# Patient Record
Sex: Female | Born: 1970 | Race: Black or African American | Hispanic: Yes | Marital: Married
Health system: Midwestern US, Community
[De-identification: ages and names within clinical notes are randomized; demographics above are authoritative.]

## PROBLEM LIST (undated history)

## (undated) DIAGNOSIS — R011 Cardiac murmur, unspecified: Secondary | ICD-10-CM

## (undated) DIAGNOSIS — F32A Depression, unspecified: Secondary | ICD-10-CM

## (undated) DIAGNOSIS — E119 Type 2 diabetes mellitus without complications: Secondary | ICD-10-CM

## (undated) DIAGNOSIS — T7840XA Allergy, unspecified, initial encounter: Secondary | ICD-10-CM

## (undated) DIAGNOSIS — B009 Herpesviral infection, unspecified: Secondary | ICD-10-CM

## (undated) DIAGNOSIS — K219 Gastro-esophageal reflux disease without esophagitis: Secondary | ICD-10-CM

## (undated) DIAGNOSIS — I10 Essential (primary) hypertension: Secondary | ICD-10-CM

## (undated) DIAGNOSIS — J45909 Unspecified asthma, uncomplicated: Secondary | ICD-10-CM

## (undated) DIAGNOSIS — D573 Sickle-cell trait: Secondary | ICD-10-CM

## (undated) DIAGNOSIS — G4730 Sleep apnea, unspecified: Secondary | ICD-10-CM

## (undated) DIAGNOSIS — T464X5A Adverse effect of angiotensin-converting-enzyme inhibitors, initial encounter: Secondary | ICD-10-CM

## (undated) DIAGNOSIS — J4540 Moderate persistent asthma, uncomplicated: Secondary | ICD-10-CM

## (undated) DIAGNOSIS — R058 Other specified cough: Secondary | ICD-10-CM

## (undated) DIAGNOSIS — Z1231 Encounter for screening mammogram for malignant neoplasm of breast: Secondary | ICD-10-CM

## (undated) DIAGNOSIS — IMO0001 Reserved for inherently not codable concepts without codable children: Secondary | ICD-10-CM

## (undated) DIAGNOSIS — R931 Abnormal findings on diagnostic imaging of heart and coronary circulation: Secondary | ICD-10-CM

## (undated) DIAGNOSIS — R519 Headache, unspecified: Secondary | ICD-10-CM

## (undated) DIAGNOSIS — E559 Vitamin D deficiency, unspecified: Secondary | ICD-10-CM

## (undated) DIAGNOSIS — N63 Unspecified lump in unspecified breast: Secondary | ICD-10-CM

## (undated) DIAGNOSIS — R768 Other specified abnormal immunological findings in serum: Secondary | ICD-10-CM

## (undated) DIAGNOSIS — M7710 Lateral epicondylitis, unspecified elbow: Secondary | ICD-10-CM

## (undated) HISTORY — DX: Sleep apnea, unspecified: G47.30

## (undated) HISTORY — PX: TONSILLECTOMY: SUR1361

## (undated) HISTORY — PX: BREAST SURGERY: SHX581

## (undated) HISTORY — PX: LIPOMA EXCISION: SHX5283

## (undated) HISTORY — DX: Depression, unspecified: F32.A

## (undated) HISTORY — DX: Herpesviral infection, unspecified: B00.9

## (undated) HISTORY — PX: COLONOSCOPY: SHX174

## (undated) HISTORY — DX: Allergy, unspecified, initial encounter: T78.40XA

## (undated) HISTORY — PX: TENDON RELEASE: SHX230

## (undated) HISTORY — DX: Gastro-esophageal reflux disease without esophagitis: K21.9

## (undated) HISTORY — DX: Type 2 diabetes mellitus without complications: E11.9

## (undated) HISTORY — DX: Sickle-cell trait: D57.3

## (undated) HISTORY — PX: WISDOM TOOTH EXTRACTION: SHX21

## (undated) HISTORY — DX: Cardiac murmur, unspecified: R01.1

---

## 2012-05-30 NOTE — Progress Notes (Signed)
HISTORY OF PRESENT ILLNESS  Jacqueline [name] is a 41 y.o. female.  Arm Pain   The history is provided by the patient. This is a new problem. The current episode started more than 1 week ago. The problem occurs constantly. The problem has been gradually worsening. The pain is present in the right arm, right hand, right elbow and right wrist. The quality of the pain is described as aching, pounding and constant. The pain is at a severity of 7/10. The pain is moderate. Associated symptoms include limited range of motion and tingling. Pertinent negatives include no itching, no back pain and no neck pain. The symptoms are aggravated by movement. She has tried OTC pain medications and OTC ointments for the symptoms. The treatment provided no relief.       Review of Systems   Constitutional: Negative.  Negative for fever, chills, weight loss, malaise/fatigue and diaphoresis.   HENT: Negative for ear pain, congestion, sore throat and neck pain.    Eyes: Negative for blurred vision and pain.   Respiratory: Negative for cough, sputum production, shortness of breath and wheezing.    Cardiovascular: Negative for chest pain, palpitations, orthopnea and leg swelling.   Gastrointestinal: Negative for nausea, vomiting, abdominal pain, diarrhea and constipation.   Genitourinary: Negative for dysuria, urgency, frequency and hematuria.   Musculoskeletal: Positive for myalgias and joint pain. Negative for back pain and falls.   Skin: Negative for itching and rash.   Neurological: Positive for tingling. Negative for dizziness, focal weakness, seizures, loss of consciousness, weakness and headaches.   Endo/Heme/Allergies: Negative for environmental allergies. Does not bruise/bleed easily.   Psychiatric/Behavioral: Negative for depression, suicidal ideas, hallucinations and memory loss. The patient is not nervous/anxious.        Physical Exam   Constitutional: She is oriented to person, place, and time. She appears well-developed and  well-nourished. No distress.   HENT:   Head: Normocephalic and atraumatic.   Right Ear: External ear normal.   Left Ear: External ear normal.   Nose: Nose normal.   Mouth/Throat: Oropharynx is clear and moist. No oropharyngeal exudate.   Eyes: Conjunctivae and EOM are normal. Pupils are equal, round, and reactive to light. Right eye exhibits no discharge. Left eye exhibits no discharge. No scleral icterus.   Neck: Normal range of motion. No tracheal deviation present.   Cardiovascular: Normal rate, regular rhythm and normal heart sounds.    Pulmonary/Chest: Effort normal and breath sounds normal. No stridor. No respiratory distress. She has no wheezes.   Abdominal: Soft. Bowel sounds are normal. She exhibits no distension. There is no tenderness. There is no guarding.   Musculoskeletal: She exhibits no edema.        Right elbow: She exhibits decreased range of motion. Tenderness found.   Lymphadenopathy:     She has no cervical adenopathy.   Neurological: She is alert and oriented to person, place, and time. No cranial nerve deficit. Coordination normal.   Skin: Skin is warm and dry. No rash noted. She is not diaphoretic. No erythema.   Psychiatric: She has a normal mood and affect. Her behavior is normal. Thought content normal.       ASSESSMENT and PLAN  Encounter Diagnoses   Name Primary?   ??? Right elbow pain Yes   ??? Tendonitis      Orders Placed This Encounter   ??? SLINGS   ??? ACE WRAP   ??? XR ELBOW RT MIN 3 V   ??? triamterene-hydrochlorothiazide (MAXZIDE) 75-50  mg per tablet   ??? tiZANidine (ZANAFLEX) 2 mg capsule   ??? potassium chloride SR (KLOR-CON 10) 10 mEq tablet   ??? albuterol (PROAIR HFA) 90 mcg/actuation inhaler   ??? predniSONE (DELTASONE) 20 mg tablet

## 2012-05-30 NOTE — Patient Instructions (Addendum)
Tendon Injury (Tendinopathy): After Your Visit  Your Care Instructions  Tendons are tough, flexible tissues that connect muscle to bone. A tendon can hurt or get torn from overuse or aging, especially tendons in the shoulder, elbow, wrist, hip, knee, or ankle. Tendon injuries may be called tendinopathy or tendinitis. Tendon injuries can occur from any motion you have to repeat in a job, sports, or daily activities. Tennis elbow is one common tendon injury.  You can treat most tendon problems with over-the-counter pain medicine, rest, changes in your activities, and physical therapy.  Follow-up care is a key part of your treatment and safety. Be sure to make and go to all appointments, and call your doctor if you are having problems. It???s also a good idea to know your test results and keep a list of the medicines you take.  How can you care for yourself at home?  ?? Rest the sore area. You may have to stop doing the activity that caused the tendon pain for a while.  ?? Take an over-the-counter pain medicine, such as acetaminophen (Tylenol), ibuprofen (Advil, Motrin), or naproxen (Aleve). Read and follow all instructions on the label.  ?? Do not take two or more pain medicines at the same time unless the doctor told you to. Many pain medicines have acetaminophen, which is Tylenol. Too much acetaminophen (Tylenol) can be harmful.  ?? Put ice or a cold pack on the sore area for 10 to 20 minutes at a time. Try to do this every 1 to 2 hours for the next 3 days (when you are awake) or until any swelling goes down. Put a thin cloth between the ice and your skin.  ?? Prop up the sore area on a pillow when you ice it or anytime you sit or lie down during the next 3 days. Try to keep it above the level of your heart. This will help reduce swelling.  ?? Follow your doctor's advice for wearing and caring for a sling, splint, or cast. In some cases, you may wear one of these for a while to help your tendon heal.  ?? Follow your  doctor's advice for stretching and physical therapy. Gently move your joint through its full range of motion. This will prevent stiffness in your joint.  ?? Go back to your activity slowly. Warm up before and stretch after the activity. You also can try making some changes. For example, if a sport caused your tendon pain, alternate the sport with another activity. If using a tool causes pain, switch hands or change your grip. Stop the activity if it hurts. After the activity, apply ice to prevent pain and swelling.  ?? Do not smoke. Smoking can slow healing. If you need help quitting, talk to your doctor about stop-smoking programs and medicines. These can increase your chances of quitting for good.  When should you call for help?  Watch closely for changes in your health, and be sure to contact your doctor if:  ?? Your pain gets worse.  ?? You do not get better as expected.    Where can you learn more?    Go to [facility]   Enter A157 in the search box to learn more about "Tendon Injury (Tendinopathy): After Your Visit."    ?? 2006-2013 [employer]. Care instructions adapted under license by [employer] (which disclaims liability or warranty for this information). This care instruction is for use with your licensed healthcare professional. If you have questions about a  medical condition or this instruction, always ask your healthcare professional. [employer] disclaims any warranty or liability for your use of this information.  Content Version: 9.8.193578; Last Revised: April 02, 2010                Elbow: Exercises  Your Care Instructions  Here are some examples of exercises for elbows. Start each exercise slowly. Ease off the exercise if you start to have pain.  Your doctor or physical therapist will tell you when you can start these exercises and which ones will work best for you.  How to do the exercises  Wrist flexor stretch    1. Extend your arm in front of  you with your palm up.  2. Bend your wrist, pointing your hand toward the floor.  3. With your other hand, gently bend your wrist farther until you feel a mild to moderate stretch in your forearm.  4. Hold for at least 15 to 30 seconds. Repeat 2 to 4 times.  Wrist extensor stretch    Repeat steps 1 to 4 of the stretch above but begin with your extended hand palm down.  Ball or sock squeeze    1. Hold a tennis ball (or a rolled-up sock) in your hand.  2. Make a fist around the ball (or sock) and squeeze.  3. Hold for about 6 seconds, and then relax for up to 10 seconds.  4. Repeat 8 to 12 times.  5. Switch the ball (or sock) to your other hand and do 8 to 12 times.  Wrist deviation    1. Sit so that your arm is supported but your hand hangs off the edge of a flat surface, such as a table.  2. Hold your hand out like you are shaking hands with someone.  3. Move your hand up and down.  4. Repeat this motion 8 to 12 times.  5. Switch arms.  6. Try to do this exercise twice with each hand.  Wrist curls    1. Place your forearm on a table with your hand hanging over the edge of the table, palm up.  2. Place a 1- to 2-pound weight in your hand. This may be a dumbbell, a can of food, or a filled water bottle.  3. Slowly raise and lower the weight while keeping your forearm on the table and your palm facing up.  4. Repeat this motion 8 to 12 times.  5. Switch arms, and do steps 1 through 4.  6. Repeat with your hand facing down toward the floor. Switch arms.  Biceps curls    1. Sit leaning forward with your legs slightly spread and your left hand on your left thigh.  2. Place your right elbow on your right thigh, and hold the weight with your forearm horizontal.  3. Slowly curl the weight up and toward your chest.  4. Repeat this motion 8 to 12 times.  5. Switch arms, and do steps 1 through 4.  Follow-up care is a key part of your treatment and safety. Be sure to make and go to all appointments, and call your doctor if you  are having problems. It's also a good idea to know your test results and keep a list of the medicines you take.    Where can you learn more?    Go to [employer]   Enter M279 in the search box to learn more about "Elbow: Exercises."    ?? 2006-2013 [employer].  Care instructions adapted under license by [employer] (which disclaims liability or warranty for this information). This care instruction is for use with your licensed healthcare professional. If you have questions about a medical condition or this instruction, always ask your healthcare professional. [employer] disclaims any warranty or liability for your use of this information.  Content Version: 9.8.193578; Last Revised: April 25, 2010      Medications Ordered Today   Medications   ??? predniSONE (DELTASONE) 20 mg tablet     Sig: 2 by mouth daily x 2 days then 1 by mouth daily x 2 days then 1/2 by mouth daily x 2 daysmedication     Dispense:  7 Tab     Refill:  0

## 2012-06-30 NOTE — Patient Instructions (Signed)
The patient is to follow up with primary care doctor in 1-2 days,If signs and symptoms become worse the pt is to go to the ER. The patient is to take medications as prescribed. .   Medications Ordered Today   Medications   ??? predniSONE (DELTASONE) 10 mg tablet     Sig: 5 tabs day 1 then 4 tabs day 2 then 3 tabs day 3 then 2 tabs day 4 then 1 tab day 5     Dispense:  15 Tab     Refill:  0   ??? acetaminophen-codeine (TYLENOL-CODEINE #3) 300-30 mg per tablet     Sig: Take 1 Tab by mouth every six (6) hours as needed for Pain for 3 days.     Dispense:  12 Tab     Refill:  0

## 2012-06-30 NOTE — Progress Notes (Signed)
HISTORY OF PRESENT ILLNESS  Jacqueline [name] is a 42 y.o. female.  Arm Pain   The history is provided by the patient. The current episode started more than 1 week ago. The problem occurs constantly. The problem has been gradually worsening. The pain is present in the right fingers, right hand and right arm. The quality of the pain is described as aching, sharp and constant. The pain is at a severity of 7/10. The pain is moderate. Associated symptoms include numbness and tingling. Pertinent negatives include no itching, no back pain and no neck pain. The symptoms are aggravated by lying down. She has tried cold for the symptoms.       Review of Systems   Constitutional: Negative for fever, chills, weight loss and diaphoresis.   HENT: Negative for hearing loss, congestion, sore throat, neck pain and tinnitus.    Eyes: Negative for blurred vision, pain, discharge and redness.   Respiratory: Negative for cough, hemoptysis, sputum production, shortness of breath and wheezing.    Cardiovascular: Negative for chest pain and palpitations.   Gastrointestinal: Negative.  Negative for heartburn, nausea, vomiting, abdominal pain, diarrhea and constipation.   Musculoskeletal: Negative.  Negative for back pain and joint pain.   Skin: Negative for itching and rash.   Neurological: Positive for tingling and numbness. Negative for weakness and headaches.       Physical Exam   Nursing note and vitals reviewed.  Constitutional: She is oriented to person, place, and time. She appears well-developed and well-nourished. She is active.   HENT:   Head: Normocephalic and atraumatic.   Right Ear: External ear normal.   Left Ear: External ear normal.   Nose: Nose normal.   Mouth/Throat: Oropharynx is clear and moist.   Eyes: Conjunctivae and EOM are normal. Pupils are equal, round, and reactive to light.   Neck: Normal range of motion. Neck supple. No tracheal deviation present. No thyromegaly present.   Cardiovascular: Normal rate, regular  rhythm, normal heart sounds and normal pulses.    No murmur heard.  Pulmonary/Chest: Effort normal and breath sounds normal.   Abdominal: Soft. Normal appearance and bowel sounds are normal. She exhibits no distension and no mass. There is no tenderness.   Musculoskeletal:        Arms:  Lymphadenopathy:     She has no cervical adenopathy.   Neurological: She is alert and oriented to person, place, and time. She has normal strength.   Skin: Skin is warm and dry. No rash noted.   Psychiatric: She has a normal mood and affect. Her behavior is normal. Judgment and thought content normal.       ASSESSMENT and PLAN  1. De Quervain's disease (tenosynovitis)  REFERRAL TO ORTHOPEDICS   2. Ulnar neuritis  REFERRAL TO ORTHOPEDICS     Medications Ordered Today   Medications   ??? predniSONE (DELTASONE) 10 mg tablet     Sig: 5 tabs day 1 then 4 tabs day 2 then 3 tabs day 3 then 2 tabs day 4 then 1 tab day 5     Dispense:  15 Tab     Refill:  0   ??? acetaminophen-codeine (TYLENOL-CODEINE #3) 300-30 mg per tablet     Sig: Take 1 Tab by mouth every six (6) hours as needed for Pain for 3 days.     Dispense:  12 Tab     Refill:  0     The patients condition was discussed with the patient and they  understand.  The patient is to follow up with primary care doctor ,If signs and symptoms become worse the pt is to go to the ER. The patient is to take medications as prescribed.

## 2012-07-01 NOTE — Addendum Note (Signed)
Addended by: Jone Baseman D on: 07/01/2012 04:23 PM     Modules accepted: Orders

## 2012-09-06 NOTE — Progress Notes (Signed)
HISTORY OF PRESENT ILLNESS  Jacqueline [name] is a 42 y.o. female.    has a past medical history of Hypertension; Asthma; Sickle cell trait; and Infertility, female.    Chief Complaint   Patient presents with   ??? Establish Care     had cholesterol checked at booth at health fair and triglycerides were over 500   ??? Medication Refill   ??? Dry Skin     eczema          HPI  Pt is a 42 yo WF with the above listed PMH who presents today to establish care after moving from [facility]. Marland Kitchen She would also like to discuss elevated Triglyceride reading at a health fair [place], CP, HTN, GERD symptoms, eczema and Asthma.    Chest Pain  Patient complains of chest pain. Onset was 6 months ago, with unchanged course since that time. Presented to [facility] ER with elevated BP and headache.  The patient admits to chest discomfort that is intermittent, lasting only 20-30 seconds, with radiation to epigastrium, rated as mild in intensity that is dull, substernal in nature.  Associated symptoms are palpitations, fatigue, lower extremity edema. Aggravating factors are none.  Alleviating factors are: nothing. Patient's cardiac risk factors are family history, dyslipidemia, obesity, stress.  Patient's risk factors for DVT/PE: none. Previous cardiac testing includes: Today in office. EKG: normal EKG, normal sinus rhythm, there are no previous tracings available for comparison.     family history-- father MI prior to age 25.     Hypertension  Patient is here to establish for longstanding hypertension.  Dx several years ago.  She indicates that she is feeling well and is not having any symptoms referable to her hypertension (see ROS). Seen 1 month ago at [facility] ED with severe headache, bp was elevated.  CT head or MRI was done and was negative.  Agrees to  Sign record release.   She is not exercising and is adherent to low salt diet.  Blood pressure is not being checked at home, but no headaches since. She admits to some swelling in lower  extremities that comes and goes. She denies trying compression stockings to prevent or treat the swelling. She admits that propping them up in the evenings tends to help. And swelling gone in am.       Patient presents for evaluation of longstanding reflux symptoms. She does not have history of diagnosed esophageal reflux. Complains of the following symptoms at present: chest pain, heartburn, midepigastric pain, regurgitation of undigested food, shortness of breath, symptoms occur at night and upper abdominal discomfort. Symptoms have been present for several months.  She denies dysphagia.  She  has not lost weight.  She denies melena, hematochezia, hematemesis, and coffee ground emesis.   Medical therapy in the past has included tums but she states they do not relieve all of her symptoms.    Patient presents for evaluation of dyspnea.  The patient has been previously diagnosed with asthma. Symptoms currently occur weekly.  Observed precipitants include sneezing, mold, pollens and dust. Patient states she has always had severe allergies. She tried allergy shots but did not complete therapy. Current limitations in activity from asthma: none.   Does she do nebulizer treatments? no  Does she use a rescue inhaler? yes  If yes, how often? 2 times per week.      Patient presents for evaluation of lipids. She was told at a health fair in the [facility] that she  had hypertriglyceridemia and needed to see a PCP soon.  She was not fasting but TG were >500. A repeat fasting lipid profile was done. The patient exercises rarely and does not follow how calorie, low fat, high protein, high fiber diet.  She is not on any medication for this and never has been. She assume it was checked before but does not know.  The patient is not known to have coexisting vascular disease.     Patient would also like to discuss her eczema. She states she has had problems with eczema for several years. Would like refills on her prn steroid cream  today.,    Preventative Health  Patient's health maintenance is up to date except for flu shot.  Pt declines at this time.         Review of Systems   Constitutional: Positive for malaise/fatigue. Negative for fever, chills, weight loss and diaphoresis.   HENT: Negative for congestion and sore throat.    Eyes: Negative for blurred vision and photophobia.   Respiratory: Negative for cough, shortness of breath and wheezing.    Cardiovascular: Positive for chest pain, palpitations and leg swelling.   Gastrointestinal: Positive for heartburn. Negative for nausea, vomiting, abdominal pain, diarrhea, constipation, blood in stool and melena.   Genitourinary: Negative for dysuria.        Long standing infertility. Patient and her husband have been trying to conceive for 12 years with no success. She has been to several specialists but would like to ssee one in the area.  Has been worked up extensively only thing it revealed was her SS trait.    Musculoskeletal: Negative for myalgias.   Skin: Positive for rash.        Dry skin brittle hair      Neurological: Negative for dizziness, loss of consciousness, weakness and headaches.   Endo/Heme/Allergies: Positive for environmental allergies.        Patient carries the sickle cell trait.    Psychiatric/Behavioral: Negative.      Past Medical History   Diagnosis Date   ??? Hypertension    ??? Asthma    ??? Sickle cell trait    ??? Infertility, female      Past Surgical History   Procedure Laterality Date   ??? Pr breast surgery procedure unlisted       breast reduction   ??? Hx heent       tonsillectomy     Family History   Problem Relation Age of Onset   ??? Stroke Mother    ??? Cancer Father    ??? Heart Disease Father    ??? Diabetes Father      History     Social History   ??? Marital Status: MARRIED     Spouse Name: N/A     Number of Children: N/A   ??? Years of Education: N/A     Occupational History   ??? Not on file.     Social History Main Topics   ??? Smoking status: Never Smoker    ??? Smokeless  tobacco: Never Used   ??? Alcohol Use: No   ??? Drug Use: No   ??? Sexually Active: Not on file     Other Topics Concern   ??? Not on file     Social History Narrative   ??? No narrative on file     Allergies   Allergen Reactions   ??? Pcn (Penicillins) Hives   ??? Sulfa (Sulfonamide Antibiotics) Hives   ???  Ultram (Tramadol) Other (comments)     fainted     Current Outpatient Prescriptions on File Prior to Visit   Medication Sig Dispense Refill   ??? potassium chloride SR (KLOR-CON 10) 10 mEq tablet Take 20 mEq by mouth two (2) times a day.       ??? albuterol (PROAIR HFA) 90 mcg/actuation inhaler Take  by inhalation.         No current facility-administered medications on file prior to visit.       Filed Vitals:    09/05/12 1529   BP: 130/90   Pulse: 98   Temp: 98.6 ??F (37 ??C)   TempSrc: Temporal   Resp: 18   Height: 5\' 2"  (1.575 m)   Weight: 183 lb (83.008 kg)   SpO2: 98%   LMP: 08/11/2012   .  Body mass index is 33.46 kg/(m^2).          Physical Exam   Nursing note and vitals reviewed.  Constitutional: She is oriented to person, place, and time. She appears well-developed and well-nourished. No distress.   HENT:   Head: Normocephalic and atraumatic.   Right Ear: External ear normal.   Left Ear: External ear normal.   Nose: Nose normal.   Mouth/Throat: Oropharynx is clear and moist. No oropharyngeal exudate.   Eyes: Conjunctivae are normal. Pupils are equal, round, and reactive to light. No scleral icterus.   Neck: Normal range of motion. Neck supple. No JVD present. No tracheal deviation present.   Cardiovascular: Normal rate, regular rhythm, normal heart sounds and intact distal pulses.  Exam reveals no gallop and no friction rub.    No murmur heard.  Pulmonary/Chest: Effort normal and breath sounds normal. No respiratory distress. She has no wheezes.   Abdominal: Soft. Bowel sounds are normal. She exhibits no distension and no mass. There is no tenderness.   Musculoskeletal: Normal range of motion. She exhibits no edema.    Lymphadenopathy:     She has no cervical adenopathy.   Neurological: She is alert and oriented to person, place, and time.   Skin: Skin is warm and dry. Rash noted.   Eczema noted on thorac      Psychiatric: She has a normal mood and affect. Her behavior is normal.       ASSESSMENT and PLAN    ICD-9-CM    1. Chest pain 786.50 CBC WITH AUTOMATED DIFF     METABOLIC PANEL, COMPREHENSIVE     TSH, 3RD GENERATION     AMB POC EKG ROUTINE W/ 12 LEADS, INTER & REP     XR CHEST PA LAT   2. Heart palpitations 785.1 CBC WITH AUTOMATED DIFF     METABOLIC PANEL, COMPREHENSIVE     TSH, 3RD GENERATION     AMB POC EKG ROUTINE W/ 12 LEADS, INTER & REP   3. Peripheral edema 782.3 CBC WITH AUTOMATED DIFF     METABOLIC PANEL, COMPREHENSIVE     TSH, 3RD GENERATION   4. Muscle spasms of lower extremity 728.85 tiZANidine (ZANAFLEX) 2 mg capsule     CBC WITH AUTOMATED DIFF     METABOLIC PANEL, COMPREHENSIVE     TSH, 3RD GENERATION   5. Sickle cell trait 282.5 CBC WITH AUTOMATED DIFF     METABOLIC PANEL, COMPREHENSIVE     TSH, 3RD GENERATION   6. HTN (hypertension) 401.9 triamterene-hydrochlorothiazide (MAXZIDE) 75-50 mg per tablet     CBC WITH AUTOMATED DIFF     METABOLIC PANEL, COMPREHENSIVE  TSH, 3RD GENERATION   7. Asthma 493.90 CBC WITH AUTOMATED DIFF     METABOLIC PANEL, COMPREHENSIVE     TSH, 3RD GENERATION   8. GERD (gastroesophageal reflux disease) 530.81 CBC WITH AUTOMATED DIFF     METABOLIC PANEL, COMPREHENSIVE     TSH, 3RD GENERATION     omeprazole (PRILOSEC) 20 mg capsule   9. Infertility, female 628.9 CBC WITH AUTOMATED DIFF     METABOLIC PANEL, COMPREHENSIVE     TSH, 3RD GENERATION     REFERRAL TO INFERTILITY   10. Screening cholesterol level V77.91 LIPID PANEL     TSH, 3RD GENERATION   11. Eczema 692.9 desoximetasone (TOPICORT) 0.25 % topical cream     Follow-up Disposition:  Return in about 1 month (around 10/07/2012).  lab results and schedule of future lab studies reviewed with patient  reviewed diet, exercise and  weight control  cardiovascular risk and specific lipid/LDL goals reviewed  reviewed medications and side effects in detail  use of aspirin to prevent MI and TIA's discussed  radiology results and schedule of future radiology studies reviewed with patient    GO TO ED OR CALL 911 if pt  experiences ANY OF THE FOLLOWING:  chest pain, SOB., jaw/neck pain, N/V, diaphoresis, or arm pain . Pt verbally voiced understanding     I have discussed the above plan with the patient. Patient has also been given instructions and information on her conditions.  Patient is aware of all possible side effects and agrees with the above mentioned plan.     Discussed pathophysiology of illness, risks versus benefits of treatments,  & possible complications. Questions answered & treatment as per orders. Total time = 60 minutes with over 50% being counseling & co-ordination of care.      Donnella Bi, DO

## 2012-09-07 LAB — CBC WITH AUTOMATED DIFF
ABS. BASOPHILS: 0 10*3/uL (ref 0.0–0.1)
ABS. EOSINOPHILS: 0.1 10*3/uL (ref 0.0–0.5)
ABS. LYMPHOCYTES: 2.1 10*3/uL (ref 0.8–3.5)
ABS. MONOCYTES: 0.6 10*3/uL — ABNORMAL LOW (ref 0.8–3.5)
ABS. NEUTROPHILS: 2.9 10*3/uL (ref 1.5–8.0)
BASOPHILS: 0 % (ref 0–2)
EOSINOPHILS: 2 % (ref 0–5)
HCT: 38.5 % — ABNORMAL LOW (ref 41–53)
HGB: 12.8 g/dL (ref 12.0–16.0)
LYMPHOCYTES: 36 % (ref 19–48)
MCH: 28.1 PG (ref 27–31)
MCHC: 33.2 g/dL (ref 31–37)
MCV: 84.4 FL (ref 80–100)
MONOCYTES: 11 % — ABNORMAL HIGH (ref 3–9)
MPV: 10.2 FL (ref 5.9–10.3)
NEUTROPHILS: 51 % (ref 40–74)
PLATELET: 271 10*3/uL (ref 130–400)
RBC: 4.56 M/uL (ref 4.2–5.4)
RDW: 14.5 % (ref 11.5–14.5)
WBC: 5.7 10*3/uL (ref 4.5–10.8)

## 2012-09-07 LAB — METABOLIC PANEL, COMPREHENSIVE
A-G Ratio: 1.3 (ref 1.2–2.2)
ALT (SGPT): 21 U/L (ref 12–78)
AST (SGOT): 16 U/L (ref 15–37)
Albumin: 4.1 g/dL (ref 3.4–5.0)
Alk. phosphatase: 68 U/L (ref 50–136)
Anion gap: 8 mmol/L (ref 6–15)
BUN/Creatinine ratio: 22 (ref 7–25)
BUN: 13 MG/DL (ref 7–18)
Bilirubin, total: 0.6 MG/DL (ref <1.1)
CO2: 28 mmol/L (ref 21–32)
Calcium: 8.9 MG/DL (ref 8.5–10.1)
Chloride: 102 mmol/L (ref 98–107)
Creatinine: 0.6 MG/DL (ref 0.60–1.30)
GFR est AA: >60 mL/min/{1.73_m2} (ref >60)
GFR est non-AA: >60 mL/min/{1.73_m2} (ref >60)
Globulin: 3.1 g/dL (ref 2.4–3.5)
Glucose: 72 mg/dL (ref 70–110)
Potassium: 4.2 mmol/L (ref 3.5–5.3)
Protein, total: 7.2 g/dL (ref 6.4–8.2)
Sodium: 138 mmol/L (ref 136–145)

## 2012-09-07 LAB — LIPID PANEL
Cholesterol, total: 185 MG/DL (ref <200)
HDL Cholesterol: 56 MG/DL (ref 32–96)
LDL, calculated: 118.76 MG/DL (ref <130)
Triglyceride: 64 MG/DL (ref <150)
VLDL, calculated: 12.8 MG/DL (ref 5–32)

## 2012-09-07 LAB — TSH 3RD GENERATION: TSH: 2.69 u[IU]/mL (ref 0.35–3.74)

## 2012-09-07 NOTE — Progress Notes (Signed)
Quick Note:    Specifically tell pt cholesterol is normal tell her to never have cholestrol check at health fair again (per me)  Overall labs work looks good; there are a few labs that are outside the standard normal range, clinical significance doubtful  Donnella Bi, DO    ______

## 2012-09-07 NOTE — Progress Notes (Signed)
Labs were collected @ 950 am per Dr. Hyacinth Meeker, patient was fasting  1-purple tube  1-green tube  1-yellow tube

## 2012-09-08 NOTE — Progress Notes (Signed)
Quick Note:    Spoke with pt regarding lab resutls.  ______

## 2012-10-03 NOTE — Progress Notes (Signed)
Quick Note:    Let pt know xrays were normal, still recommend physical therapy   Bonita Quin will call with appointment   Donnella Bi, DO    ______

## 2012-10-03 NOTE — Progress Notes (Signed)
HISTORY OF PRESENT ILLNESS  Jacqueline [name] is a 42 y.o. female.    has a past medical history of Hypertension; Asthma; Sickle cell trait; and Infertility, female.    Chief Complaint   Patient presents with   ??? Spasms     still having muscle spasms in back   ??? Allergies     having problems with allergies, use to be on singulair, having some congestion, was sick last week   ??? Ear Pain     right ear hurting more than left, didnt see wax buildup, maybe fluid?          HPI  The patient is a very pleasant 42 year old white female with the above listed past medical history who presents to the office today to follow-up care.  Discussed lab work with patient in detail.  See below.  The patient is here to follow-up for atypical chest pain that she states has completely resolved with resuming her Albuterol inhaler and taking the acid reflux medication.  The patient is now currently denying any heartburn, esophageal burning, substernal burning.  There is no associated bloating, nausea, vomiting, or diarrhea.  The patient states the associated chest tightness has completely resolved with use of inhaler.  The patient is a long-term asthmatic who had not had medication for "quite a while" due to lack of insurance.  The patient states that she is having to use her Albuterol inhaler at least twice a day.  She was previously on Singulair.  She states that it really helped control her asthma especially this time of the year.  She has some clear rhinitis with some plugged sensation in her ears.  The patient also is complaining of some irritation on the external ear as well.  The patient states that she believes there may be some wax build-up in it.  The right side is hurting more than left but both sides just feel irritated.      The patient presents today to discuss back pain for the first time.  The patient has macromastia, history of breast reduction secondary to thoracic back pain and states that recently she has begun having some  decreased range of motion with muscle spasms in her thoracic and lower back.  There is no radiation of pain.  There is no loss of bowel or bladder.  She describes the pain as mild to moderate.  At its worse it is a deep ache and spasm.  The patient states at its best it is just a nagging soreness.  She has tried over-the-counter antiinflammatories with little success.  I do not believe that continuing NSAIDs would be a good choice for the patient at this point considering her recent bout with reflux symptoms.  The patient states that she does get some relief from the Zanaflex that she is on but would like to try some physical therapy as well.  She does not have any recent imaging.  She has never seen any type of physiatrist  before or had any type of procedures.  There is no history of trauma.            Review of Systems   Constitutional: Positive for malaise/fatigue.-- improved  Negative for fever, chills, weight loss and diaphoresis.   HENT: Negative for congestion and sore throat.    Eyes: Negative for blurred vision and photophobia.   Respiratory: Per HPI  Cardiovascular: Positive for chest pain, palpitations and leg swelling.--- Resolved   Gastrointestinal: Positive for heartburn.  Negative for nausea, vomiting, abdominal pain, diarrhea, constipation, blood in stool and melena.   Genitourinary: Negative for dysuria.        Long standing infertility. Patient and her husband have been trying to conceive for 12 years with no success. She has been to several specialists but would like to ssee one in the area.  Has been worked up extensively only thing it revealed was her SS trait. -- referral to infertility specialist last appointment     Musculoskeletal: per HPI   Skin: Positive for rash.        Dry skin brittle hair      Neurological: Negative for dizziness, loss of consciousness, weakness and headaches.   Endo/Heme/Allergies: Positive for environmental allergies.        Patient carries the sickle cell trait.     Psychiatric/Behavioral: Negative.      Past Medical History   Diagnosis Date   ??? Hypertension    ??? Asthma    ??? Sickle cell trait    ??? Infertility, female      Past Surgical History   Procedure Laterality Date   ??? Pr breast surgery procedure unlisted       breast reduction   ??? Hx heent       tonsillectomy     Family History   Problem Relation Age of Onset   ??? Stroke Mother    ??? Cancer Father    ??? Heart Disease Father    ??? Diabetes Father      History     Social History   ??? Marital Status: MARRIED     Spouse Name: N/A     Number of Children: N/A   ??? Years of Education: N/A     Occupational History   ??? Not on file.     Social History Main Topics   ??? Smoking status: Never Smoker    ??? Smokeless tobacco: Never Used   ??? Alcohol Use: No   ??? Drug Use: No   ??? Sexually Active: Not on file     Other Topics Concern   ??? Not on file     Social History Narrative   ??? No narrative on file     Allergies   Allergen Reactions   ??? Pcn (Penicillins) Hives   ??? Sulfa (Sulfonamide Antibiotics) Hives   ??? Ultram (Tramadol) Other (comments)     fainted     Current Outpatient Prescriptions on File Prior to Visit   Medication Sig Dispense Refill   ??? desoximetasone (TOPICORT) 0.25 % topical cream Apply  to affected area two (2) times a day.  100 g  0   ??? triamterene-hydrochlorothiazide (MAXZIDE) 75-50 mg per tablet Take 1 Tab by mouth daily.  30 Tab  2   ??? tiZANidine (ZANAFLEX) 2 mg capsule Take 2 mg by mouth three (3) times daily.  90 Cap  2   ??? omeprazole (PRILOSEC) 20 mg capsule Take 1 Cap by mouth daily.  30 Cap  2   ??? potassium chloride SR (KLOR-CON 10) 10 mEq tablet Take 20 mEq by mouth two (2) times a day.       ??? albuterol (PROAIR HFA) 90 mcg/actuation inhaler Take  by inhalation.         No current facility-administered medications on file prior to visit.       Filed Vitals:    10/03/12 1043   BP: 120/70   Pulse: 81   Temp: 98.9 ??F (37.2 ??C)  TempSrc: Temporal   Resp: 18   Height: 5\' 2"  (1.575 m)   Weight: 176 lb (79.833 kg)   SpO2: 97%    LMP: 08/11/2012   .  Body mass index is 32.18 kg/(m^2).          Physical Exam   Nursing note and vitals reviewed.  Constitutional: She is oriented to person, place, and time. She appears well-developed and well-nourished. No distress.   HENT:   Head: Normocephalic and atraumatic.   Right Ear: External ear normal. Canal erythematous   Left Ear: External ear normal. Canal erythematous   Nose: Nose normal.   Mouth/Throat: Oropharynx is clear and moist. No oropharyngeal exudate.   Eyes: Conjunctivae are normal. Pupils are equal, round, and reactive to light. No scleral icterus.   Neck: Normal range of motion. Neck supple. No JVD present. No tracheal deviation present.   Cardiovascular: Normal rate, regular rhythm, normal heart sounds and intact distal pulses.  Exam reveals no gallop and no friction rub.    No murmur heard.  Pulmonary/Chest: Effort normal and breath sounds normal. No respiratory distress. She has no wheezes.   Abdominal: Soft. Bowel sounds are normal. She exhibits no distension and no mass. There is no tenderness.   Musculoskeletal: Thoracic and lumbar paraspinal tenderness with associated decreased ROM and pain    Lymphadenopathy:     She has no cervical adenopathy.   Neurological: She is alert and oriented to person, place, and time.  LE DTRs 2/4   Skin: Skin is warm and dry. Rash noted.   Eczema noted on thorac      Psychiatric: She has a normal mood and affect. Her behavior is normal.     Lab Results   Component Value Date/Time    WBC 5.7 09/07/2012  9:50 AM    HGB 12.8 09/07/2012  9:50 AM    HCT 38.5 09/07/2012  9:50 AM    PLATELET 271 09/07/2012  9:50 AM    MCV 84.4 09/07/2012  9:50 AM     Lab Results   Component Value Date/Time    Sodium 138 09/07/2012  9:50 AM    Potassium 4.2 09/07/2012  9:50 AM    Chloride 102 09/07/2012  9:50 AM    CO2 28 09/07/2012  9:50 AM    Anion gap 8 09/07/2012  9:50 AM    Glucose 72 09/07/2012  9:50 AM    BUN 13 09/07/2012  9:50 AM    Creatinine 0.60 09/07/2012  9:50 AM     BUN/Creatinine ratio 22 09/07/2012  9:50 AM    GFR est AA >60 09/07/2012  9:50 AM    GFR est non-AA >60 09/07/2012  9:50 AM    Calcium 8.9 09/07/2012  9:50 AM    Bilirubin, total 0.6 09/07/2012  9:50 AM    AST 16 09/07/2012  9:50 AM    Alk. phosphatase 68 09/07/2012  9:50 AM    Protein, total 7.2 09/07/2012  9:50 AM    Albumin 4.1 09/07/2012  9:50 AM    Globulin 3.1 09/07/2012  9:50 AM    A-G Ratio 1.3 09/07/2012  9:50 AM    ALT 21 09/07/2012  9:50 AM     Lab Results   Component Value Date/Time    TSH 2.69 09/07/2012  9:50 AM     Lab Results   Component Value Date/Time    Cholesterol, total 185 09/07/2012  9:50 AM    HDL Cholesterol 56 09/07/2012  9:50 AM  LDL, calculated 118.76 09/07/2012  9:50 AM    VLDL, calculated 12.8 09/07/2012  9:50 AM    Triglyceride 64 09/07/2012  9:50 AM       ASSESSMENT and PLAN    ICD-9-CM    1. Low back pain 724.2 XR SPINE LUMB 2 OR 3 V     REFERRAL TO PHYSICAL THERAPY     XR SPINE THORAC 3 V   2. HTN (hypertension) 401.9 Continue current therapy    3. GERD (gastroesophageal reflux disease) 530.81 Continue current therapy    4. Asthma 493.90 montelukast (SINGULAIR) 10 mg tablet   5. Thoracic back pain 724.1 XR SPINE LUMB 2 OR 3 V     REFERRAL TO PHYSICAL THERAPY     XR SPINE THORAC 3 V   6. Otitis externa 380.10 neomycin-polymyxin-hydrocortisone, buffered, (PEDIOTIC) 3.5-10,000-1 mg-unit/mL-% otic suspension     Follow-up Disposition: prn   lab results and schedule of future lab studies reviewed with patient  reviewed diet, exercise and weight control  cardiovascular risk and specific lipid/LDL goals reviewed  reviewed medications and side effects in detail  use of aspirin to prevent MI and TIA's discussed  radiology results and schedule of future radiology studies reviewed with patient    GO TO ED OR CALL 911 if pt  experiences ANY OF THE FOLLOWING:  chest pain, SOB., jaw/neck pain, N/V, diaphoresis, or arm pain . Pt verbally voiced understanding     I have discussed the above plan with the patient.  Patient has also been given instructions and information on her conditions.  Patient is aware of all possible side effects and agrees with the above mentioned plan.     Discussed pathophysiology of illness, risks versus benefits of treatments,  & possible complications. Questions answered & treatment as per orders. Total time = 30 minutes with over 50% being counseling & co-ordination of care.      Donnella Bi, DO

## 2012-10-03 NOTE — Patient Instructions (Addendum)
Back Stretches: Exercises  Your Care Instructions  Here are some examples of exercises for stretching your back. Start each exercise slowly. Ease off the exercise if you start to have pain.  Your doctor or physical therapist will tell you when you can start these exercises and which ones will work best for you.  How to do the exercises  Overhead stretch    1. Stand comfortably with your feet shoulder-width apart.  2. Looking straight ahead, raise both arms over your head and reach toward the ceiling. Do not allow your head to tilt back.  3. Hold for 15 to 30 seconds, then lower your arms to your sides.  4. Repeat 2 to 4 times.  Side stretch    1. Stand comfortably with your feet shoulder-width apart.  2. Raise one arm over your head, and then lean to the other side.  3. Slide your hand down your leg as you let the weight of your arm gently stretch your side muscles. Hold for 15 to 30 seconds.  4. Repeat 2 to 4 times on each side.  Press-up    1. Lie on your stomach, supporting your body with your forearms.  2. Press your elbows down into the floor to raise your upper back. As you do this, relax your stomach muscles and allow your back to arch without using your back muscles. As your press up, do not let your hips or pelvis come off the floor.  3. Hold for 15 to 30 seconds, then relax.  4. Repeat 2 to 4 times.  Relax and rest    1. Lie on your back with a rolled towel under your neck and a pillow under your knees. Extend your arms comfortably to your sides.  2. Relax and breathe normally.  3. Remain in this position for about 10 minutes.  4. If you can, do this 2 or 3 times each day.  Follow-up care is a key part of your treatment and safety. Be sure to make and go to all appointments, and call your doctor if you are having problems. It's also a good idea to know your test results and keep a list of the medicines you take.   Where can you learn more?   Go to http://www.healthwise.net/[facility]  Enter Y090 in the  search box to learn more about "Back Stretches: Exercises."   ?? 2006-2014 [employer]. Care instructions adapted under license by [employer] (which disclaims liability or warranty for this information). This care instruction is for use with your licensed healthcare professional. If you have questions about a medical condition or this instruction, always ask your healthcare professional. [employer]wise, Inc[employer] disclaims any warranty or liability for your use of this information.  Content Version: 10.0.270728; Last Revised: May 06, 2012

## 2012-10-04 NOTE — Progress Notes (Signed)
Quick Note:    Patient was notified and she would like for you to email her the stretches that you told her about yesterday  ______

## 2012-10-04 NOTE — Progress Notes (Signed)
Quick Note:    Done    ______

## 2012-10-13 NOTE — Progress Notes (Signed)
Physical Therapy Back Evaluation    Patient Information:  Jacqueline [name]   [birth year]  [id number]     Referring Physician: Donnella Bi, DO   Medical diagnosis: mid and low back pain  Date of onset:  chronic  Date of surgery:   N/A for this diagnosis  Orders:  Eval and Treat   Been previously seen in PT this calendar year?:     Past Medical History   Diagnosis Date   ??? Hypertension    ??? Asthma    ??? Sickle cell trait    ??? Infertility, female      Past Surgical History   Procedure Laterality Date   ??? Pr breast surgery procedure unlisted       breast reduction   ??? Hx heent       tonsillectomy     Current Outpatient Prescriptions on File Prior to Encounter   Medication Sig Dispense Refill   ??? montelukast (SINGULAIR) 10 mg tablet Take 1 Tab by mouth daily.  60 Tab  2   ??? neomycin-polymyxin-hydrocortisone, buffered, (PEDIOTIC) 3.5-10,000-1 mg-unit/mL-% otic suspension Administer 3 Drops in left ear four (4) times daily for 10 days.  10 mL  0   ??? desoximetasone (TOPICORT) 0.25 % topical cream Apply  to affected area two (2) times a day.  100 g  0   ??? triamterene-hydrochlorothiazide (MAXZIDE) 75-50 mg per tablet Take 1 Tab by mouth daily.  30 Tab  2   ??? tiZANidine (ZANAFLEX) 2 mg capsule Take 2 mg by mouth three (3) times daily.  90 Cap  2   ??? omeprazole (PRILOSEC) 20 mg capsule Take 1 Cap by mouth daily.  30 Cap  2   ??? potassium chloride SR (KLOR-CON 10) 10 mEq tablet Take 20 mEq by mouth two (2) times a day.       ??? albuterol (PROAIR HFA) 90 mcg/actuation inhaler Take  by inhalation.         No current facility-administered medications on file prior to encounter.     Allergies   Allergen Reactions   ??? Pcn (Penicillins) Hives   ??? Sulfa (Sulfonamide Antibiotics) Hives   ??? Ultram (Tramadol) Other (comments)     fainted     Are you currently taking any prescription medications, over-the-counter medications, or herbal supplements? See above  Have you ever had any allergic or adverse reactions to any medications? See above     SUBJECTIVE:  Jacqueline [name] is a 42 y.o. female presenting to Physical Therapy with a chief complaint of pain in the thoracic and lumbar spine that comes intermittently. States it feels like spasms. She was picking up sticks in yard and it brought it on and other times it starts for no problem. She takes a diuretic and takes potassium. .  The pain has ranged from 10/10 at worst to 0/10 at best since onset.  Has chronic pain in the low back with intermittent severe pain. Rates pain 6/10 right now.    Mechanism of injury: Patient can recall no specific incident or injury   Pain/Symptom Location & description: mid and low back. Denies radicular sx's   Functional Limitations/aggravating activities:  []               Stairs    [x]               Prolonged Sitting  [x]               Prolonged Standing  []   Walking  []               Driving  [x]               Household Chores(sweeping)  [x]               Bending over to pick up object  [x]               Lifting   []               Balance  []               Other (comment):  Alleviating: rest, muscle relaxer prn, ibuprofen  Sleep quality:good  Exercise:  No specific, formal exercise program at this time for this problem.   PLOF:  Patient previously able to perform all functional activities without  pain.   Patient Goals:  Resolution of pain, return to PLOF   Imaging:  X-rays negative    OBJECTIVE:    Fall Risk Assessment: Timed Up-and-Go 9 seconds  Seconds Rating Follow Up Requirement   <10 Freely Mobile No follow up required   11-20 Mostly Independent No follow up required   20-29  Variable Mobility Follow up testing at physical therapist's discretion    >30 Impaired Mobility Further balance/mobility testing required (specific test is at PT's discretion)       Observation/Posture: Sitting: good       Gait: normal  Pelvic Alignment/Landmarks: L iliac crest and PSIS are superior  Leg Length Assessment: mechanical  Sensation: Sensation to light touch grossly intact and  equal at bilateral LE dermatomes   Balance: Not formally assessed at today's Eval:   Palpation: Patient reports tenderness to palpation at the PSIS bilateral     AROM Trunk        Flexion   3/3      Extension  3/3      Right Sidebend  3/3      Left sidebending  3/3        Strength  Right  Left      Hip Flexion  5/5  5/5      Knee Flexion  5/5  5/5      Knee ext  5/5  5/5      Ankle DF  5/5  5/5      Hip Ext  4/5  4/5      Gluteus Maximus  4/5  4/5   Upper Abdominals: 4/5  Lower Abdominals: 4/5     Special Tests  Right  Left      Fabers  neg  neg      SLR  neg  neg      Sit test  pos  pos     Joint Play Assessment/Mobility: good    Today's Treatment:  Reviewed and discussed status, ther ex, POC/intervention options at length with patient. Performed an Upslip maneuver and the Erhardt. Both released and corrected her alignment. Instructed her in Cat/camel exercise to be done 2x/day.      ASSESSMENT:  Angely Dietz is a 42 y.o. female who presents with symptoms consistent with a pelvic obliquety. No neuro signs noted so hopefully it is a mechanical dysfunction. She reported her pain dropped from 6/10 to 3/10 post treatment. All trunk motions were less painful and just pulling foot up to tie shoe was less painful. . Pt exhibits decreased/impaired ROM/ flexibility/ strength/ posture and increased pain leading to altered function(See above-listed aggrav/functional limitations)  and should benefit from  further treatment. Will begin lumbar stabilization exercises next visit.    Complexities/Co-morbidities/Other Relevent Patient Conditions or Factors that may adversely affect the prognosis/rate of progress for this patient:       GOALS:   Short-Term Goals as needed to achieve Long-Term Functional Goals:  Independent with HEP with supervised modifications   Decrease pain  Improve Flexibility      Report increased postural awareness    Long-Term Functional Goals (to be achieved by discharge):     Patient to tolerate prolonged  standing greater than or = to 45 min to allow for patient to prepare meals & clean up after without  pain     Patient to tolerate ambulating greater than or equal to 45 min to allow for patient to perform grocery shopping without  pain     Patient to tolerate prolonged sitting greater than or = to 60 min to allow for eating meals with family without  pain     Patient to tolerate bending to tie shoes/donn/doff lower body clothing without  pain      Patient to tolerate lifting full laundry basket without  pain      Plan of Care:    Frequency: 2 x/week  Duration: 3-4 weeks     For a maximum total number of  8 visits over a Certification Period of: 10/13/2012 through 11/18/12  Treatment to include (as indicated):  [x]                 Joint Mobilizations []                 Neuromuscular Re-education  []                 Transfer Training [x]                 Soft Tissue Mobilizations  []                 Gait Training []                 Modalities prn  []                 Therapeutic Exercises []                 Balance  []                 Therapeutic Activities [x]                 Patient and Family Training/Education  []                 Other (comment):    Rehab prognosis: Good  with active involvement in treatment.    Patient educated regarding findings/POC, and is in agreement with POC and above-listed goals. Patient agrees to be an active participant in her rehabilitation.    Date POC Established: 10/13/2012    Thank you for this referral.    Signed,    Minna Merritts, PT    PT eval: 60 minutes (untimed code)  Therapeutic Exercise: 0 minutes  Total billable minutes at today's Initial Evaluation Appointment: 60 min      PHYSICIAN APPROVAL:  I certify that the services identified in the evaluation are necessary.        Physician Signature:_________________________________  Date:_____________

## 2012-10-19 NOTE — Progress Notes (Addendum)
Physical Therapy Daily Treatment Note    Patient Information  Jacqueline [name]   April 17, 1971  [id number]     Referring Physician: Donnella Bi, DO   Medical Diagnosis: low back pain    DATE :10/19/2012    Subjective:   Patient reports she has felt better since she was here last but continues to have 4/10 pain in the back.    Summary List:  Patient denies any new medical diagnoses or changes in medical conditions since last visit.  Patient denies any operations or procedures since last visit.  Patient denies any new adverse or allergic drug reactions since last visit.  Patient denies any changes to medication list (herbals, prescription, & OTC).     Objective:   Pelvic obliquety not as large today.   Long Sit positive but it is slight today.  Performed Upslip maneuver and Erhardt followed by core strengthening exercises.  Please refer to today's Treatment Flowsheet & or below listed activities for exercises / interventions performed today. These addressed any or all of the following: education / ROM / strength / flexibility / tissue healing / tissue mobility / joint mobility / edema control / pain control / postural awareness ( which are necessary to achieve pt's LTFG's).        Patient educated on continuing compliance with HEP.       Assessment:   Jacqueline [name] is a 42 y.o. female who presents with symptoms consistent with a pelvic obliquety with low back pain. She tolerated exercises well. Pain was 2/10 post treatment.  Needs verbal and physical prompts to perform exercises correctly.    Plan:   Plan to continue to see 2 x /week  for education, therapeutic exercise, manual therapy prn progressing as patient tolerates focusing on increasing strength, reducing deficits in order to improve overall function.        Minna Merritts, PT    Therapeutic Exercise (Timed Code): 30 minutes  Therapeutic Activity (Timed Code): 0 minutes  Manual Therapy (Timed Code): 0 minutes  Neuromuscular Re-Ed (Timed Code): 0 minutes  Total  Billable Time: 30 minutes

## 2012-10-21 NOTE — Progress Notes (Signed)
Physical Therapy Daily Treatment Note    Patient Information  Jacqueline [name]   09-02-1970  [id number]     Referring Physician: Donnella Bi, DO   Medical Diagnosis:lbp    DATE :10/21/2012    Subjective:   Patient reports she feels pretty good.  She has a place on right side of lbp that is tender and more painful with certain movements.    Summary List:  Patient denies any new medical diagnoses or changes in medical conditions since last visit.  Patient denies any operations or procedures since last visit.  Patient denies any new adverse or allergic drug reactions since last visit.  Patient denies any changes to medication list (herbals, prescription, & OTC).     Objective:   Please refer to today's Treatment Flowsheet & or below listed activities for exercises / interventions performed today. These addressed any or all of the following: education / ROM / strength / flexibility / tissue healing / tissue mobility / joint mobility / edema control / pain control / postural awareness ( which are necessary to achieve pt's LTFG's).  Cont with therapeutic ex as per flow sheet working on passive le stretching, trunk stabilization and endurance to improve her adls and decrease her c/o pain.    Manual Therapy Interventions:6 minutes of grade 3 sacral mobs to improve mobility and decrease c/o pain.  Neuromuscular Re-ed Interventions:0  Active Stretches: 0  Passive Stretches: 0      Patient educated on continuing compliance with HEP.       Assessment:   Jacqueline [name] is a 42 y.o. female who presents with symptoms consistent with lbp. She exhibits decreased trunk stabilization and has tight le ms.  Pt tender over right si.  Tolerated today's session well.      Plan:   Plan to continue to see 2 x /week for 1-2 week/s for education, therapeutic exercise, manual therapy prn progressing as patient tolerates focusing on increasing strength, reducing deficits in order to improve overall function.      Harlene Ramus, PTA     Therapeutic Exercise (Timed Code): 30 minutes  Therapeutic Activity (Timed Code): 0 minutes  Manual Therapy (Timed Code): 6 minutes  Neuromuscular Re-Ed (Timed Code): 0 minutes  Total Billable Time: 36 minutes

## 2012-10-24 ENCOUNTER — Telehealth

## 2012-10-24 NOTE — Telephone Encounter (Signed)
Patient is requesting refill on Klor-Con 10 meq two tabs daily. This medication was listed as historical medication only.

## 2012-10-24 NOTE — Progress Notes (Signed)
Physical Therapy Daily Treatment Note    Patient Information  Jacqueline [name]   Sep 22, 1970  [id number]     Referring Physician: Donnella Bi, DO   Medical Diagnosis: low back pain    DATE :10/24/2012  Visit 3 of 8 specified in original POC at time of Initial Evaluation.    Subjective:   Patient reports her back has felt good all weekend. Still has a tender spot in the low back.    Summary List:  Patient denies any new medical diagnoses or changes in medical conditions since last visit.  Patient denies any operations or procedures since last visit.  Patient denies any new adverse or allergic drug reactions since last visit.  Patient denies any changes to medication list (herbals, prescription, & OTC).     Objective:   Palpation:   Positive pelvic obliquety with slight leg length discrepancy.   Treatment:  Upslip maneuver and Erhardt maneuver  7 min. Both SI joints released. Sacral mobs.  Continued to work on core stabilization using pelvic neutral principle.  Please refer to today's Treatment Flowsheet & or below listed activities for exercises / interventions performed today. These addressed any or all of the following: education / ROM / strength / flexibility / tissue healing / tissue mobility / joint mobility / edema control / pain control / postural awareness ( which are necessary to achieve pt's LTFG's).      Patient educated on continuing compliance with HEP.       Assessment:   Jacqueline [name] is a 42 y.o. female who presents with symptoms consistent with pelvic obliquety.      Plan:   Plan to continue to see 2 x /week for 1-2 week/s for education, therapeutic exercise, manual therapy prn progressing as patient tolerates focusing on increasing strength, reducing deficits in order to improve overall function.      Minna Merritts, PT    Therapeutic Exercise (Timed Code): 20 minutes  Therapeutic Activity (Timed Code): 0 minutes  Manual Therapy (Timed Code): 15 minutes  Neuromuscular Re-Ed (Timed Code): 0 minutes   Total Billable Time: 35 minutes

## 2012-10-26 NOTE — Progress Notes (Signed)
[facility] called to refill pts Prilosec, needed 90 day supply so insurance would pay, gave them the okay to refill Prilosec 20 mg, 90 day supply per Dr. Hyacinth Meeker.

## 2012-10-28 NOTE — Progress Notes (Signed)
Physical Therapy Daily Treatment Note    Patient Information  Jacqueline [name]   Aug 25, 1970  [id number]     Referring Physician: Donnella Bi, DO   Medical Diagnosis:lbp    DATE :10/28/2012    Subjective:   Patient reports pain level of 5 in right si.  Feels good otherwise.    Summary List:  Patient denies any new medical diagnoses or changes in medical conditions since last visit.  Patient denies any operations or procedures since last visit.  Patient denies any new adverse or allergic drug reactions since last visit.  Patient denies any changes to medication list (herbals, prescription, & OTC).     Objective:   Please refer to today's Treatment Flowsheet & or below listed activities for exercises / interventions performed today. These addressed any or all of the following: education / ROM / strength / flexibility / tissue healing / tissue mobility / joint mobility / edema control / pain control / postural awareness ( which are necessary to achieve pt's LTFG's).  Cont with therapeutic ex as per flow sheet working on active stretching and trunk stabilization to improve her adls and decrease her c/o pain.    Manual Therapy Interventions:12 minutes of manual therapy consisting of upslip maneuver and sacral mobs to improve her mobility and decrease his /co pain.  Neuromuscular Re-ed Interventions:0  Active Stretches: 0  Passive Stretches: 0      Patient educated on continuing compliance with HEP.       Assessment:   Jacqueline [name] is a 42 y.o. female who presents with symptoms consistent with lbp. She exhibits decreased stabilization and cont to have si pain. Tolerated today's session well.      Plan:   Plan to continue to see 1-2 x /week for 12 week/s for education, therapeutic exercise, manual therapy prn progressing as patient tolerates focusing on increasing strength, reducing deficits in order to improve overall function.      Harlene Ramus, PTA    Therapeutic Exercise (Timed Code): 20 minutes  Therapeutic  Activity (Timed Code): 0 minutes  Manual Therapy (Timed Code): 12 minutes  Neuromuscular Re-Ed (Timed Code): 0 minutes  Total Billable Time: 32 minutes

## 2012-11-01 NOTE — Progress Notes (Addendum)
Physical Therapy Discharge Summary    Patient Information  Jacqueline [name]   June 21, 1971  [id number]     Referring Physician: Donnella Bi, DO   Medical Diagnosis: low back pain    DATE :11/01/2012  Visit 6 of 8 specified in original POC at time of Initial Evaluation.    Subjective:   Patient reports she has no pain today. Has lost 6 pounds since she started therapy.    Summary List:  Patient denies any new medical diagnoses or changes in medical conditions since last visit.  Patient denies any operations or procedures since last visit.  Patient denies any new adverse or allergic drug reactions since last visit.  Patient denies any changes to medication list (herbals, prescription, & OTC).     Objective:   Palpation:  Slight tenderness over the SI jts but much improved  Good pelvic alignment.   MTT:  Sacral mobs, SI mobs with Gr 3, used blocks in front of ASIS's and mobilized lumbosacral spine.   Please refer to today's Treatment Flowsheet & or below listed activities for exercises / interventions performed today. These addressed any or all of the following: education / ROM / strength / flexibility / tissue healing / tissue mobility / joint mobility / edema control / pain control / postural awareness ( which are necessary to achieve pt's LTFG's).    Added to her core strengthening program.     Patient educated on continuing compliance with HEP.       Assessment:   Jacqueline [name] is a 42 y.o. female who presents with symptoms consistent with pelvic obliquety. She has met her goals and is back to normal function. She no longer has trouble getting moving in the morning. She has a little point tenderness over the SI jt but nothing significant. She is very pleased with how she feels.    Plan:   Pt has met her goals and we have discharged her to a community program.    Minna Merritts, PT    Therapeutic Exercise (Timed Code): 20 minutes  Therapeutic Activity (Timed Code): 0 minutes  Manual Therapy (Timed Code): 15 minutes   Neuromuscular Re-Ed (Timed Code): 0 minutes  Total Billable Time: 35 minutes

## 2012-12-15 NOTE — Patient Instructions (Signed)
[facility]  Office working hours are Monday - Friday 8:30 am to 4 pm.  Saturday 9 am to 12 pm for acute illnesses only for established patients.  Office phone number is [phone] and fax is [phone].  For non-emergent medical care and clinical advice during office hours:   1. Call office or   2.  Send message or request using MyChart  For non-emergent medical care and clinical advice after office hours:  1.  Call [phone] or  2.  Send message or request using MyChart  Emergency care can be obtained at the [facility] ER, Urgent Care or calling 911.    Patient Satisfaction Survey  We appreciate you giving us your e-mail address.  Please watch for our patient satisfaction survey which you will receive by e-mail.  We strive to provide you with the best care possible.  We respect all comments and will take comments into consideration to improve our service.  Thank you for your participation.

## 2012-12-15 NOTE — Progress Notes (Signed)
HISTORY OF PRESENT ILLNESS  Analyssa [name] is a 42 y.o. female.    has a past medical history of Hypertension; Asthma; Sickle cell trait; and Infertility, female.    Chief Complaint   Patient presents with   ??? Breast Problem     has mass in breast they found in s. Jacqueline [name], says it is causing alot of pain in her breast and under her arm   ??? Abdominal Pain     had tumor removed from left side of abdomen about 4 years ago and says she can feel something there again         HPI  The patient is a very pleasant 42 year old African American female with a history of keloid formation and breast reduction, who presents to the office today for mass and breast tenderness on the left side with two new palpable lumps on the left axillary line.  The patient had breast mass removed in September of 2013 in [place].  The patient is going there next week and states that she will try and get records from her prior physician.  She states that biopsy of this area was performed and was benign and a titanium chip was put in its place, but over the last 2 weeks it has become increasingly more tender to the touch.  The patient had a period approximately 3 weeks ago.  She is usually regular and believes that she will start sometime next week.  There is some scarring on both breast, more pronounced on the right. The patient also has some persistent numbness in the right breast as well.  No fever, no chills, no nipple discharge, no nipple inversion, no associated skin puckering.      The patient would also like to be seen for a left sided abdominal mass.  She states she had a lipoma removed approximately 4 years ago.  She believes it is growing back.  It is difficult to palpate what the patient is feeling.  I do feel a significant amount of scar tissue around the area.  It is slightly tender to the touch.        ROS  A thorough 10 point review of systems was done and was unremarkable except for HPI and chronic medical conditions.        Past Medical History   Diagnosis Date   ??? Hypertension    ??? Asthma    ??? Sickle cell trait    ??? Infertility, female      Past Surgical History   Procedure Laterality Date   ??? Pr breast surgery procedure unlisted       breast reduction   ??? Hx heent       tonsillectomy     Family History   Problem Relation Age of Onset   ??? Stroke Mother    ??? Cancer Father    ??? Heart Disease Father    ??? Diabetes Father      History     Social History   ??? Marital Status: MARRIED     Spouse Name: N/A     Number of Children: N/A   ??? Years of Education: N/A     Occupational History   ??? Not on file.     Social History Main Topics   ??? Smoking status: Never Smoker    ??? Smokeless tobacco: Never Used   ??? Alcohol Use: No   ??? Drug Use: No   ??? Sexually Active: Not on file  Other Topics Concern   ??? Not on file     Social History Narrative   ??? No narrative on file     Allergies   Allergen Reactions   ??? Pcn (Penicillins) Hives   ??? Sulfa (Sulfonamide Antibiotics) Hives   ??? Ultram (Tramadol) Other (comments)     fainted     Current Outpatient Prescriptions on File Prior to Visit   Medication Sig Dispense Refill   ??? tiZANidine (ZANAFLEX) 2 mg capsule TAKE ONE CAPSULE BY MOUTH 3 TIMES A DAY  90 Cap  2   ??? potassium chloride SR (KLOR-CON 10) 10 mEq tablet Take 2 Tabs by mouth two (2) times a day.  120 Tab  2   ??? montelukast (SINGULAIR) 10 mg tablet Take 1 Tab by mouth daily.  60 Tab  2   ??? desoximetasone (TOPICORT) 0.25 % topical cream Apply  to affected area two (2) times a day.  100 g  0   ??? triamterene-hydrochlorothiazide (MAXZIDE) 75-50 mg per tablet Take 1 Tab by mouth daily.  30 Tab  2   ??? omeprazole (PRILOSEC) 20 mg capsule Take 1 Cap by mouth daily.  30 Cap  2   ??? albuterol (PROAIR HFA) 90 mcg/actuation inhaler Take  by inhalation.         No current facility-administered medications on file prior to visit.       Filed Vitals:    12/15/12 1455   BP: 120/80   Pulse: 110   Resp: 18   Height: 5\' 2"  (1.575 m)   Weight: 175 lb (79.379 kg)   SpO2:  97%   .  Body mass index is 32 kg/(m^2).          Physical Exam   Pulmonary/Chest: She exhibits mass, tenderness and bony tenderness. Right breast exhibits no inverted nipple, no mass, no nipple discharge and no tenderness. Left breast exhibits tenderness. Left breast exhibits no inverted nipple, no mass and no nipple discharge. Breasts are symmetrical.           General: NAD. A x O x 3     Skin: no evidence of rashes, scars, or moles    HEENT: Head: atraumatic normocephalic, Eyes:  PERLA, EOMI Ears: without discharge or pain of external exam Nares: patent Throat: with out erythema     Neck: no thyromegaly    Heart:  RRR, S1S2, no murmur, gallop, or rub    Lungs:   CTA bilaterally     Abdomen:  Soft, nontender, BS x 4    Extremities:  Strength 4/4, no cyanosis, no edema    ASSESSMENT and PLAN    ICD-9-CM    1. Painful lumpy left breast 611.71 [place] OTHER SOFT TISSUE     MAM MAMMO BI DX DIGTL     US BREAST LT        2. Left breast lump 611.72 [place] OTHER SOFT TISSUE     MAM MAMMO BI DX DIGTL     US BREAST LT        3. Soft tissue mass 729.90 [place] OTHER SOFT TISSUE     MAM MAMMO BI DX DIGTL     US BREAST LT     Follow-up Disposition: after imaging   radiology results and schedule of future radiology studies reviewed with patient      I have discussed the above plan with the patient. Patient has also been given instructions and information on her conditions.  Patient is aware of  all possible side effects and agrees with the above mentioned plan.       Hermenia Bers, DO

## 2012-12-27 ENCOUNTER — Encounter

## 2012-12-27 NOTE — Progress Notes (Signed)
Quick Note:    Let pt know that US of the abdomen is normal  Donnella Bi, DO    ______

## 2012-12-27 NOTE — Progress Notes (Signed)
Quick Note:    MAILED LETTER TO PT.  ______

## 2012-12-27 NOTE — Progress Notes (Signed)
US soft tissue of abdomen completed 2:30pm by Laurence Compton RT(R)

## 2012-12-28 ENCOUNTER — Encounter

## 2012-12-28 NOTE — Progress Notes (Signed)
Quick Note:    [place] of breast was normal   However, because she feels a place Have pt schedule follow-up to discuss the possibility of further work-up  Donnella Bi, DO    ______

## 2012-12-28 NOTE — Telephone Encounter (Signed)
rx'd new Singulair. [facility] reports that Ins will only pay for 90 day supply.

## 2012-12-28 NOTE — Progress Notes (Signed)
Quick Note:    Pt coming Monday @ 11:30 am.  ______

## 2013-01-02 NOTE — Progress Notes (Signed)
HISTORY OF PRESENT ILLNESS  Jacqueline [name] is a 42 y.o. female.    has a past medical history of Hypertension; Asthma; Sickle cell trait; and Infertility, female.    Chief Complaint   Patient presents with   ??? Follow-up     ULTRASOUND          HPI  The patient is a very pleasant 42 year old African American female with a history of keloid formation and breast reduction, who presents to the office today for follow-up mass and breast tenderness on the left side with two new palpable lumps.  The patient had breast mass removed in September of 2013 in [place].  She states that biopsy of this area was performed and was benign and a titanium chip was put in its place, but over the last month it has become increasingly more tender to the touch.  There is some scarring on both breast, more pronounced on the right. The patient also has some persistent numbness in the right breast as well.  No fever, no chills, no nipple discharge, no nipple inversion, no associated skin puckering.   Prior breast exam as follows. Mammogram below.  Pt very worried and would like opinion about biopsy from surgeon.           ROS  A thorough 10 point review of systems was done and was unremarkable except for HPI and chronic medical conditions.       Past Medical History   Diagnosis Date   ??? Hypertension    ??? Asthma    ??? Sickle cell trait    ??? Infertility, female      Past Surgical History   Procedure Laterality Date   ??? Pr breast surgery procedure unlisted       breast reduction   ??? Hx heent       tonsillectomy     Family History   Problem Relation Age of Onset   ??? Stroke Mother    ??? Cancer Father    ??? Heart Disease Father    ??? Diabetes Father      History     Social History   ??? Marital Status: MARRIED     Spouse Name: N/A     Number of Children: N/A   ??? Years of Education: N/A     Occupational History   ??? Not on file.     Social History Main Topics   ??? Smoking status: Never Smoker    ??? Smokeless tobacco: Never Used   ??? Alcohol Use: No   ???  Drug Use: No   ??? Sexually Active: Not on file     Other Topics Concern   ??? Not on file     Social History Narrative   ??? No narrative on file     Allergies   Allergen Reactions   ??? Pcn (Penicillins) Hives   ??? Sulfa (Sulfonamide Antibiotics) Hives   ??? Ultram (Tramadol) Other (comments)     fainted     Current Outpatient Prescriptions on File Prior to Visit   Medication Sig Dispense Refill   ??? montelukast (SINGULAIR) 10 mg tablet Take 1 Tab by mouth daily.  90 Tab  1   ??? tiZANidine (ZANAFLEX) 2 mg capsule TAKE ONE CAPSULE BY MOUTH 3 TIMES A DAY  90 Cap  2   ??? potassium chloride SR (KLOR-CON 10) 10 mEq tablet Take 2 Tabs by mouth two (2) times a day.  120 Tab  2   ???  desoximetasone (TOPICORT) 0.25 % topical cream Apply  to affected area two (2) times a day.  100 g  0   ??? triamterene-hydrochlorothiazide (MAXZIDE) 75-50 mg per tablet Take 1 Tab by mouth daily.  30 Tab  2   ??? omeprazole (PRILOSEC) 20 mg capsule Take 1 Cap by mouth daily.  30 Cap  2   ??? albuterol (PROAIR HFA) 90 mcg/actuation inhaler Take  by inhalation.         No current facility-administered medications on file prior to visit.       Filed Vitals:    01/02/13 1128   BP: 120/84   Pulse: 82   Temp: 97.6 ??F (36.4 ??C)   TempSrc: Temporal   Resp: 18   Height: 5\' 2"  (1.575 m)   .  There is no weight on file to calculate BMI.          Physical Exam         General: NAD. A x O x 3     Skin: no evidence of rashes, scars, or moles    HEENT: Head: atraumatic normocephalic, Eyes:  PERLA, EOMI Ears: without discharge or pain of external exam Nares: patent Throat: with out erythema     Neck: no thyromegaly    Heart:  RRR, S1S2, no murmur, gallop, or rub    Lungs:   CTA bilaterally     Abdomen:  Soft, nontender, BS x 4    Extremities:  Strength 4/4, no cyanosis, no edema    ASSESSMENT and PLAN    ICD-9-CM    1. Breast lump 611.72 REFERRAL TO GENERAL SURGERY            Follow-up Disposition: 2 months   radiology results and schedule of future radiology studies reviewed  with patient      I have discussed the above plan with the patient. Patient has also been given instructions and information on her conditions.  Patient is aware of all possible side effects and agrees with the above mentioned plan.       Donnella Bi, DO

## 2013-01-02 NOTE — Patient Instructions (Signed)
[facility]  Office working hours are Monday - Friday 8:30 am to 4 pm.  Saturday 9 am to 12 pm for acute illnesses only for established patients.  Office phone number is [phone] and fax is [phone].  For non-emergent medical care and clinical advice during office hours:   1. Call office or   2.  Send message or request using MyChart  For non-emergent medical care and clinical advice after office hours:  1.  Call [phone] or  2.  Send message or request using MyChart  Emergency care can be obtained at the [facility] ER, Urgent Care or calling 911.    Patient Satisfaction Survey  We appreciate you giving us your e-mail address.  Please watch for our patient satisfaction survey which you will receive by e-mail.  We strive to provide you with the best care possible.  We respect all comments and will take comments into consideration to improve our service.  Thank you for your participation.

## 2013-01-03 NOTE — Progress Notes (Signed)
Left message for pt to call me and let me know what she wants to do.

## 2013-01-03 NOTE — Progress Notes (Signed)
New referral needs put in for general surgery, dr. Noel Christmas is out of town for the next two weeks and recommended you put referral in to different office.

## 2013-01-09 NOTE — Progress Notes (Signed)
LEFT ANOTHER VM FOR PT TO RETURN CALL.

## 2013-03-13 NOTE — Patient Instructions (Signed)
[facility]  Office working hours are Monday - Friday 8:30 am to 4 pm.  Saturday 9 am to 12 pm for acute illnesses only for established patients.  Office phone number is [phone] and fax is [phone].  For non-emergent medical care and clinical advice during office hours:   1. Call office or   2.  Send message or request using MyChart  For non-emergent medical care and clinical advice after office hours:  1.  Call [phone] or  2.  Send message or request using MyChart  Emergency care can be obtained at the [facility] ER, Urgent Care or calling 911.    Patient Satisfaction Survey  We appreciate you giving us your e-mail address.  Please watch for our patient satisfaction survey which you will receive by e-mail.  We strive to provide you with the best care possible.  We respect all comments and will take comments into consideration to improve our service.  Thank you for your participation.

## 2013-03-14 NOTE — Progress Notes (Signed)
HISTORY OF PRESENT ILLNESS  Jacqueline [name] is a 42 y.o. female.    has a past medical history of Hypertension; Asthma; Sickle cell trait; and Infertility, female.    Chief Complaint   Patient presents with   ??? Follow Up Chronic Condition     Htn          HPI    The patient is a very pleasant 42 year old white female with the above listed past medical history who presents to the office today to follow up for longstanding hypertension and asthma.  Overall the patient states that she is doing great.  Blood pressure in the office today is 120/80.  She does not check her blood pressure regularly at home. She does follow a low fat, low salt diet.  She has lost approximately 10 pounds since first establishing care.  The patient exercises regularly.  She denies any symptoms referable to hypertension (see Review of Systems).      The patient presents for a longstanding asthma.  The patient states that she has not had to use her Albuterol in approximately 6 months.  She states that mainly comes with when she has upper respiratory infections.  Her Albuterol inhaler has expired and is requesting refills today.  Overall she states she is doing well.          Review of Systems   Constitutional: Negative for fever, chills, weight loss, malaise/fatigue and diaphoresis.   HENT: Negative for congestion and sore throat.    Eyes: Negative for blurred vision and photophobia.   Respiratory: Negative for cough and shortness of breath.    Cardiovascular: Negative for chest pain, palpitations and leg swelling.   Gastrointestinal: Negative for heartburn, nausea, vomiting, abdominal pain, diarrhea, constipation, blood in stool and melena.   Genitourinary: Negative for dysuria.   Musculoskeletal: Negative for myalgias.   Skin: Negative for rash.   Neurological: Negative for dizziness, loss of consciousness, weakness and headaches.   Psychiatric/Behavioral: Negative.      Past Medical History   Diagnosis Date   ??? Hypertension    ??? Asthma    ???  Sickle cell trait    ??? Infertility, female      Past Surgical History   Procedure Laterality Date   ??? Pr breast surgery procedure unlisted       breast reduction   ??? Hx heent       tonsillectomy     Family History   Problem Relation Age of Onset   ??? Stroke Mother    ??? Cancer Father    ??? Heart Disease Father    ??? Diabetes Father      History     Social History   ??? Marital Status: MARRIED     Spouse Name: N/A     Number of Children: N/A   ??? Years of Education: N/A     Occupational History   ??? Not on file.     Social History Main Topics   ??? Smoking status: Never Smoker    ??? Smokeless tobacco: Never Used   ??? Alcohol Use: No   ??? Drug Use: No   ??? Sexually Active: Not on file     Other Topics Concern   ??? Not on file     Social History Narrative   ??? No narrative on file     Allergies   Allergen Reactions   ??? Pcn [Penicillins] Hives   ??? Sulfa (Sulfonamide Antibiotics) Hives   ??? Ultram [Tramadol]  Other (comments)     fainted     Current Outpatient Prescriptions on File Prior to Visit   Medication Sig Dispense Refill   ??? montelukast (SINGULAIR) 10 mg tablet Take 1 Tab by mouth daily.  90 Tab  1   ??? tiZANidine (ZANAFLEX) 2 mg capsule TAKE ONE CAPSULE BY MOUTH 3 TIMES A DAY  90 Cap  2   ??? potassium chloride SR (KLOR-CON 10) 10 mEq tablet Take 2 Tabs by mouth two (2) times a day.  120 Tab  2   ??? desoximetasone (TOPICORT) 0.25 % topical cream Apply  to affected area two (2) times a day.  100 g  0   ??? triamterene-hydrochlorothiazide (MAXZIDE) 75-50 mg per tablet Take 1 Tab by mouth daily.  30 Tab  2   ??? omeprazole (PRILOSEC) 20 mg capsule Take 1 Cap by mouth daily.  30 Cap  2     No current facility-administered medications on file prior to visit.       Filed Vitals:    03/13/13 1525   BP: 120/80   Pulse: 75   Resp: 18   Height: 5\' 2"  (1.575 m)   Weight: 172 lb (78.019 kg)   SpO2: 98%   .  Body mass index is 31.45 kg/(m^2).          Physical Exam   Nursing note and vitals reviewed.  Constitutional: She is oriented to person, place,  and time. She appears well-developed and well-nourished. No distress.   HENT:   Head: Normocephalic and atraumatic.   Right Ear: External ear normal.   Left Ear: External ear normal.   Nose: Nose normal.   Mouth/Throat: Oropharynx is clear and moist. No oropharyngeal exudate.   Eyes: Conjunctivae are normal. Pupils are equal, round, and reactive to light. No scleral icterus.   Neck: Normal range of motion. Neck supple. No JVD present. No tracheal deviation present.   Cardiovascular: Normal rate, regular rhythm, normal heart sounds and intact distal pulses.  Exam reveals no gallop and no friction rub.    No murmur heard.  Pulmonary/Chest: Effort normal and breath sounds normal. No respiratory distress.   Abdominal: Soft. Bowel sounds are normal. She exhibits no distension and no mass. There is no tenderness.   Musculoskeletal: Normal range of motion. She exhibits no edema.   Lymphadenopathy:     She has no cervical adenopathy.   Neurological: She is alert and oriented to person, place, and time.   Skin: Skin is warm and dry.   Psychiatric: She has a normal mood and affect. Her behavior is normal.       ASSESSMENT and PLAN    ICD-9-CM    1. RAD (reactive airway disease) 493.90 albuterol (PROAIR HFA) 90 mcg/actuation inhaler   2. HTN (hypertension) 401.9 CBC WITH AUTOMATED DIFF     METABOLIC PANEL, BASIC     Follow-up Disposition: 6 months   lab results and schedule of future lab studies reviewed with patient  reviewed diet, exercise and weight control  reviewed medications and side effects in detail  use of aspirin to prevent MI and TIA's discussed    I have discussed the above plan with the patient. Patient has also been given instructions and information on her conditions.  Patient is aware of all possible side effects and agrees with the above mentioned plan.       Donnella Bi, DO

## 2013-03-16 LAB — METABOLIC PANEL, BASIC
Anion gap: 9 mmol/L (ref 6–15)
BUN/Creatinine ratio: 14 (ref 7–25)
BUN: 11 MG/DL (ref 7–18)
CO2: 28 mmol/L (ref 21–32)
Calcium: 9.6 MG/DL (ref 8.5–10.1)
Chloride: 97 mmol/L — ABNORMAL LOW (ref 98–107)
Creatinine: 0.8 MG/DL (ref 0.60–1.30)
GFR est AA: >60 mL/min/{1.73_m2} (ref >60)
GFR est non-AA: >60 mL/min/{1.73_m2} (ref >60)
Glucose: 95 mg/dL (ref 70–110)
Potassium: 3.5 mmol/L (ref 3.5–5.3)
Sodium: 134 mmol/L — ABNORMAL LOW (ref 136–145)

## 2013-03-16 LAB — CBC WITH AUTOMATED DIFF
ABS. BASOPHILS: 0 10*3/uL (ref 0.0–0.1)
ABS. EOSINOPHILS: 0 10*3/uL (ref 0.0–0.5)
ABS. LYMPHOCYTES: 2.3 10*3/uL (ref 0.8–3.5)
ABS. MONOCYTES: 0.7 10*3/uL — ABNORMAL LOW (ref 0.8–3.5)
ABS. NEUTROPHILS: 3.3 10*3/uL (ref 1.5–8.0)
BASOPHILS: 0 % (ref 0–2)
EOSINOPHILS: 1 % (ref 0–5)
HCT: 38.6 % — ABNORMAL LOW (ref 41–53)
HGB: 12.8 g/dL (ref 12.0–16.0)
LYMPHOCYTES: 36 % (ref 19–48)
MCH: 27.6 PG (ref 27–31)
MCHC: 33.2 g/dL (ref 31–37)
MCV: 83.4 FL (ref 80–100)
MONOCYTES: 11 % — ABNORMAL HIGH (ref 3–9)
MPV: 10.2 FL (ref 5.9–10.3)
NEUTROPHILS: 52 % (ref 40–74)
PLATELET: 280 10*3/uL (ref 130–400)
RBC: 4.63 M/uL (ref 4.2–5.4)
RDW: 14.2 % (ref 11.5–14.5)
WBC: 6.4 10*3/uL (ref 4.5–10.8)

## 2013-03-16 NOTE — Progress Notes (Signed)
Labs were collected @ 1045 am per Dr. Miller,   1-purple tube  1-green tube

## 2013-03-17 NOTE — Progress Notes (Signed)
Quick Note:    MAILED LETTER TO PT.  ______

## 2013-03-17 NOTE — Progress Notes (Signed)
Quick Note:    Let pt know   Overall labs work looks good; there are a few labs that are outside the standard normal range, clinical significance doubtful    ______

## 2013-05-29 LAB — POTASSIUM: Potassium: 3.6 mmol/L (ref 3.5–5.3)

## 2013-05-29 LAB — EKG, 12 LEAD, INITIAL
Atrial Rate: 73 {beats}/min
Calculated P Axis: 50 degrees
Calculated R Axis: 38 degrees
Calculated T Axis: 28 degrees
Diagnosis: NORMAL
P-R Interval: 118 ms
Q-T Interval: 362 ms
QRS Duration: 70 ms
QTC Calculation (Bezet): 398 ms
Ventricular Rate: 73 {beats}/min

## 2013-05-29 LAB — HGB & HCT
HCT: 39.5 % — ABNORMAL LOW (ref 41–53)
HGB: 13.4 g/dL (ref 12.0–16.0)

## 2013-05-29 LAB — HCG QL SERUM: HCG, Ql.: NEGATIVE

## 2013-05-29 NOTE — Telephone Encounter (Signed)
FYI,   She wanted to let you know she is having surgery on her elbow Thursday with Dr. Sandria Manly.

## 2013-06-01 ENCOUNTER — Inpatient Hospital Stay: Payer: PRIVATE HEALTH INSURANCE

## 2013-06-01 MED ADMIN — fentaNYL citrate (PF) injection 25 mcg: INTRAVENOUS | @ 14:00:00 | NDC 00409909422

## 2013-06-01 MED ADMIN — fentaNYL citrate (PF) injection 25 mcg: INTRAVENOUS | @ 15:00:00 | NDC 00409909422

## 2013-06-01 MED ADMIN — lactated ringers infusion: INTRAVENOUS | @ 13:00:00 | NDC 00409795309

## 2013-06-01 MED ADMIN — HYDROcodone-acetaminophen (NORCO) 10-325 mg tablet 1 tablet: ORAL | @ 16:00:00 | NDC 68084010011

## 2013-06-01 MED ADMIN — midazolam (VERSED) injection 2 mg: INTRAVENOUS | @ 13:00:00 | NDC 00409230517

## 2013-06-01 MED ADMIN — clindamycin (CLEOCIN) 600mg D5W 50mL IVPB (premix): INTRAVENOUS | @ 13:00:00 | NDC 00781328991

## 2013-06-01 MED ADMIN — ondansetron (ZOFRAN) injection 4 mg: INTRAVENOUS | @ 14:00:00 | NDC 00781301072

## 2013-06-01 NOTE — Brief Op Note (Signed)
BRIEF OPERATIVE NOTE    Date of Procedure: 06/01/2013   Preoperative Diagnosis: LATERAL EPICONDYLITIS RIGHT ELBOW   Postoperative Diagnosis: LATERAL EPICONDYLITIS RIGHT ELBOW     Procedure(s):  RIGHT EXTENSOR TENDON RIGHT ELBOW RELEASE  Surgeon(s) and Role:     * Juanjose Mojica Lendell Caprice, MD - Primary  Anesthesia: General   Estimated Blood Loss: 0  Specimens: * No specimens in log *   Findings: 0   Complications: 0  Implants: * No implants in log *

## 2013-06-01 NOTE — Op Note (Signed)
[facility]      PT Name:  [name], Jacqueline [name]         Admitted:  06/01/2013  MR#:  [id number]                     DOB:  08/21/1970  Account #:  [id number]            Age:  42  Dictator:  Marguita Venning. Robert Tilton Marsalis, M.D.     Location:      OPERATIVE REPORT    SURGERY DATE: 06/01/2013    PREOPERATIVE DIAGNOSIS:  Chronic lateral epicondylitis right elbow.    POSTOPERATIVE DIAGNOSIS:  Chronic lateral epicondylitis right elbow.    OPERATION:  Release of extensor tendon from the lateral epicondyle.    DESCRIPTION OF OPERATIVE PROCEDURE:  The patient was brought to the Operating Theater and given General  anesthetic with orotracheal intubation.  The right arm was prepped  and draped in the usual fashion. Pneumatic tourniquet was employed  and inflated to 250 mmHg.  A three quarter inch incision was made  diagonally over the latera epicondyle. Flaps were elevated medially  and laterally.  Dissection was carried down to the extensor tendon  origin.  It was released off the lateral epicondyle corresponding to  the point of maximal tenderness preoperatively. The wound was  thoroughly irrigated and closed with a running interrupted 3-0 Nylon  with Steri-Strips and a compressive dressing. Tourniquet was let  down. Tourniquet time was approximately 50 minutes. The patient was  awakened and taken to the Recovery Room in satisfactory condition.        _____________________________  Vega Withrow. Robert Stefanos Haynesworth, M.D.    TRL:sp DD: 06/01/2013 08:43:03  DT: 06/01/2013 15:07:02 Job ID:  [id number]  CC:      Document #:  [id number]

## 2013-06-01 NOTE — Progress Notes (Signed)
Pre-op visit, explained PCS, offered support, patient voiced anxiety, provided requested prayer.

## 2013-06-01 NOTE — Progress Notes (Signed)
Post-Anesthesia Evaluation & Assessment:    Name: Jacqueline [name]  Age: 42 y.o.  Sex: female  DOB: January 17, 1971  MRN: [id number]  CSN: [id number]     Visit Vitals   Item Reading   ??? BP 122/82   ??? Pulse 79   ??? Temp 97 ??F (36.1 ??C)   ??? Resp 15   ??? Ht 5\' 2"  (1.575 m)   ??? Wt 79.379 kg (175 lb)   ??? BMI 32 kg/m2   ??? SpO2 99%       Patient is s/p (type of anesthesia): General    Nausea/Vomiting: no nausea    Post-operative hydration adequate.    Pain score (VAS): 3    Mental status & level of consciousness: alert and oriented x 3    Neurological status: moves all extremities, sensation grossly intact    Pulmonary status: airway patent, no supplemental oxygen required    Complications related to anesthesia: none    Patient has met all discharge requirements.    No cyanosis noted. Swallowing and gag reflex intact.    Additional commentsVita Erm, DO    06/01/2013 at 10:47 AM

## 2013-06-01 NOTE — H&P (Signed)
Date of Surgery Update:  Jacqueline [name] was seen and examined.  History and physical has been reviewed. There have been no significant clinical changes since the completion of the originally dated History and Physical.    Signed By: Gaylord Shih, MD     June 01, 2013 8:37 AM

## 2013-06-01 NOTE — Other (Signed)
Discharge instructions reviewed with patient and husband.  State understanding.

## 2013-06-01 NOTE — Op Note (Signed)
[facility]      PT Name:  Breta, Demedeiros         Admitted:  06/01/2013  MR#:  [id number]                     DOB:  07/22/1970  Account #:  [id number]            Age:  42  Dictator:  Lavan Imes. Lendell Caprice, M.D.     Location:      OPERATIVE REPORT    SURGERY DATE: 06/01/2013    PREOPERATIVE DIAGNOSIS:  Chronic lateral epicondylitis right elbow.    POSTOPERATIVE DIAGNOSIS:  Chronic lateral epicondylitis right elbow.    OPERATION:  Release of extensor tendon from the lateral epicondyle.    DESCRIPTION OF OPERATIVE PROCEDURE:  The patient was brought to the Operating Theater and given General  anesthetic with orotracheal intubation.  The right arm was prepped  and draped in the usual fashion. Pneumatic tourniquet was employed  and inflated to 250 mmHg.  A three quarter inch incision was made  diagonally over the latera epicondyle. Flaps were elevated medially  and laterally.  Dissection was carried down to the extensor tendon  origin.  It was released off the lateral epicondyle corresponding to  the point of maximal tenderness preoperatively. The wound was  thoroughly irrigated and closed with a running interrupted 3-0 Nylon  with Steri-Strips and a compressive dressing. Tourniquet was let  down. Tourniquet time was approximately 50 minutes. The patient was  awakened and taken to the Recovery Room in satisfactory condition.        _____________________________  Loghan Kurtzman. Lendell Caprice, M.D.    TRL:sp DD: 06/01/2013 08:43:03  DT: 06/01/2013 15:07:02 Job ID:  [id number]  CC:      Document #:  [id number]

## 2013-07-20 NOTE — Progress Notes (Signed)
HISTORY OF PRESENT ILLNESS  Jacqueline [name] is a 43 y.o. female.    has a past medical history of Hypertension; Asthma; Sickle cell trait; Infertility, female; Unspecified adverse effect of anesthesia; and Nausea & vomiting.    Chief Complaint   Patient presents with   ??? Cold Symptoms     fever, body aches, runny nose, nasal congestion         HPI  Patient complains of symptoms of a URI. Symptoms include plugged sensation in bilateral ear, congestion, sore throat and cough. Onset of symptoms was 5 days ago, gradually worsening since that time. Patient also c/o achiness, cough described as non-productive, without wheezing, dyspnea or hemoptysis and sinus pressure for the past 5 days .   Evaluation to date: none. Treatment to date: Nyquil and Alkalizer plus cold and cough  .Patient does not smoke cigarettes       ROS  A thorough 10 point review of systems was done and was unremarkable except for HPI and chronic medical conditions.     Past Medical History   Diagnosis Date   ??? Hypertension    ??? Asthma    ??? Sickle cell trait    ??? Infertility, female    ??? Unspecified adverse effect of anesthesia      Difficult intubation   ??? Nausea & vomiting      Past Surgical History   Procedure Laterality Date   ??? Pr breast surgery procedure unlisted       breast reduction   ??? Hx heent       tonsillectomy   ??? Hx other surgical       excision of lipoma, left abd     Family History   Problem Relation Age of Onset   ??? Stroke Mother    ??? Cancer Father    ??? Heart Disease Father    ??? Diabetes Father      History     Social History   ??? Marital Status: MARRIED     Spouse Name: N/A     Number of Children: N/A   ??? Years of Education: N/A     Occupational History   ??? Not on file.     Social History Main Topics   ??? Smoking status: Never Smoker    ??? Smokeless tobacco: Never Used   ??? Alcohol Use: No   ??? Drug Use: No   ??? Sexually Active: Not on file     Other Topics Concern   ??? Not on file     Social History Narrative   ??? No narrative on file      Allergies   Allergen Reactions   ??? Pcn [Penicillins] Hives   ??? Sulfa (Sulfonamide Antibiotics) Hives   ??? Ultram [Tramadol] Other (comments)     fainted     Current Outpatient Prescriptions on File Prior to Visit   Medication Sig Dispense Refill   ??? HYDROcodone-acetaminophen (NORCO) 10-325 mg tablet Take 1 tablet by mouth every six (6) hours as needed for Pain.  20 tablet  0   ??? KLOR-CON 10 10 mEq tablet TAKE 2 TABS BY MOUTH TWO (2) TIMES A DAY.  120 tablet  2   ??? omeprazole (PRILOSEC) 20 mg capsule TAKE ONE CAPSULE BY MOUTH DAILY  90 capsule  1   ??? tizanidine (ZANAFLEX) 2 mg capsule TAKE ONE CAPSULE BY MOUTH 3 TIMES A DAY  90 capsule  2   ??? albuterol (PROAIR HFA) 90 mcg/actuation inhaler  Take 2 puffs by inhalation every six (6) hours as needed for Wheezing.  1 Inhaler  11   ??? montelukast (SINGULAIR) 10 mg tablet Take 1 Tab by mouth daily.  90 Tab  1   ??? desoximetasone (TOPICORT) 0.25 % topical cream Apply  to affected area two (2) times a day.  100 g  0   ??? triamterene-hydrochlorothiazide (MAXZIDE) 75-50 mg per tablet Take 1 Tab by mouth daily.  30 Tab  2     No current facility-administered medications on file prior to visit.       Filed Vitals:    07/20/13 1417   BP: 130/70   Pulse: 132   Temp: 99.1 ??F (37.3 ??C)   TempSrc: Temporal   Resp: 18   Height: 5\' 2"  (1.575 m)   Weight: 175 lb (79.379 kg)   SpO2: 99%   .  Body mass index is 32 kg/(m^2).          Physical Exam      Nursing note and vitals reviewed.  Constitutional: She is oriented to person, place, and time. She appears well-developed and well-nourished. No distress.   HENT:   Head: Normocephalic and atraumatic.   Right Ear: External ear normal.   Left Ear: External ear normal.   Ears:    Nose: Mucosal edema, rhinorrhea and sinus tenderness present.   Mouth/Throat: Uvula is midline and mucous membranes are normal. Posterior oropharyngeal erythema present. No oropharyngeal exudate, posterior oropharyngeal edema or tonsillar abscesses.   Eyes: Conjunctivae  are normal. Pupils are equal, round, and reactive to light. No scleral icterus.   Neck: Normal range of motion. Neck supple. No JVD present. No tracheal deviation present.   Cardiovascular: Normal rate, regular rhythm, normal heart sounds and intact distal pulses.  Exam reveals no gallop and no friction rub.    No murmur heard.  Pulmonary/Chest: Effort normal and breath sounds normal. No respiratory distress.   Abdominal: Soft. Bowel sounds are normal. She exhibits no distension and no mass. There is no tenderness.   Musculoskeletal: Normal range of motion. She exhibits no edema.   Lymphadenopathy:     She has no cervical adenopathy.   Neurological: She is alert and oriented to person, place, and time.   Skin: Skin is warm and dry.   Psychiatric: She has a normal mood and affect. Her behavior is normal.     ASSESSMENT and PLAN    ICD-9-CM    1. Acute sinus infection 461.9 azithromycin (ZITHROMAX) 250 mg tablet     DEXAMETHASONE SODIUM PHOSPHATE INJECTION 1 MG     PR THER/PROPH/DIAG INJECTION, SUBCUT/IM     dexamethasone (DECADRON) 4 mg/mL injection     fluticasone (FLONASE) 50 mcg/actuation nasal spray     Follow-up Disposition: prn   very strongly urged to quit smoking to reduce cardiovascular risk  reviewed medications and side effects in detail  radiology results and schedule of future radiology studies reviewed with patient    I have discussed the above plan with the patient. Patient has also been given instructions and information on her conditions.  Patient is aware of all possible side effects and agrees with the above mentioned plan.       Stress the need for plenty of fluids, rest, and alternating tylenol and motrin as needed.  Personally gave warning signs of dehydration and when to go to the ER.      Donnella BiLauren E Alka Falwell, DO

## 2013-09-11 NOTE — Progress Notes (Signed)
HISTORY OF PRESENT ILLNESS  Jacqueline [name] is a 43 y.o. female.    has a past medical history of Hypertension; Asthma; Sickle cell trait; Infertility, female; Unspecified adverse effect of anesthesia; and Nausea & vomiting.    Chief Complaint   Patient presents with   ??? Follow Up Chronic Condition     no new problems         HPI    The patient is a very pleasant 43 year old white female with the above listed past medical history who presents to the office today to follow up for longstanding hypertension and asthma.  Overall the patient states that she is doing great.  Blood pressure in the office today is 118/80.  She does check her blood pressure regularly at home and averages 120-125/80. She does follow a low fat, low salt diet.  She has lost approximately 10 pounds since first establishing care.  The patient exercises regularly.  She denies any symptoms referable to hypertension (see Review of Systems).      BP Readings from Last 3 Encounters:   09/11/13 118/80   07/20/13 130/70   06/01/13 122/82     Wt Readings from Last 3 Encounters:   09/11/13 179 lb (81.194 kg)   07/20/13 175 lb (79.379 kg)   06/01/13 175 lb (79.379 kg)       The patient presents for a longstanding asthma.  The patient states that she has not had to use her Albuterol in almost a year.  She states that mainly comes with when she has upper respiratory infections.  Her Albuterol inhaler has expired and is requesting refills today.  Overall she states she is doing well.          Review of Systems   Constitutional: Negative for fever, chills, weight loss, malaise/fatigue and diaphoresis.   HENT: Negative for congestion and sore throat.    Eyes: Negative for blurred vision and photophobia.   Respiratory: Negative for cough and shortness of breath.    Cardiovascular: Negative for chest pain, palpitations and leg swelling.   Gastrointestinal: Negative for heartburn, nausea, vomiting, abdominal pain, diarrhea, constipation, blood in stool and  melena.   Genitourinary: Negative for dysuria.   Musculoskeletal: Negative for myalgias.   Skin: Negative for rash.   Neurological: Negative for dizziness, loss of consciousness, weakness and headaches.   Psychiatric/Behavioral: Negative.      Past Medical History   Diagnosis Date   ??? Hypertension    ??? Asthma    ??? Sickle cell trait    ??? Infertility, female    ??? Unspecified adverse effect of anesthesia      Difficult intubation   ??? Nausea & vomiting      Past Surgical History   Procedure Laterality Date   ??? Pr breast surgery procedure unlisted       breast reduction   ??? Hx heent       tonsillectomy   ??? Hx other surgical       excision of lipoma, left abd     Family History   Problem Relation Age of Onset   ??? Stroke Mother    ??? Cancer Father    ??? Heart Disease Father    ??? Diabetes Father      History     Social History   ??? Marital Status: MARRIED     Spouse Name: N/A     Number of Children: N/A   ??? Years of Education: N/A  Occupational History   ??? Not on file.     Social History Main Topics   ??? Smoking status: Never Smoker    ??? Smokeless tobacco: Never Used   ??? Alcohol Use: No   ??? Drug Use: No   ??? Sexual Activity: Not on file     Other Topics Concern   ??? Not on file     Social History Narrative     Allergies   Allergen Reactions   ??? Peanut Anaphylaxis   ??? Pcn [Penicillins] Hives   ??? Sulfa (Sulfonamide Antibiotics) Hives   ??? Ultram [Tramadol] Other (comments)     fainted     Current Outpatient Prescriptions on File Prior to Visit   Medication Sig Dispense Refill   ??? fluticasone (FLONASE) 50 mcg/actuation nasal spray 2 sprays by Both Nostrils route daily.  1 Bottle  2   ??? KLOR-CON 10 10 mEq tablet TAKE 2 TABS BY MOUTH TWO (2) TIMES A DAY.  120 tablet  2   ??? omeprazole (PRILOSEC) 20 mg capsule TAKE ONE CAPSULE BY MOUTH DAILY  90 capsule  1   ??? tizanidine (ZANAFLEX) 2 mg capsule TAKE ONE CAPSULE BY MOUTH 3 TIMES A DAY  90 capsule  2   ??? albuterol (PROAIR HFA) 90 mcg/actuation inhaler Take 2 puffs by inhalation every  six (6) hours as needed for Wheezing.  1 Inhaler  11   ??? montelukast (SINGULAIR) 10 mg tablet Take 1 Tab by mouth daily.  90 Tab  1   ??? desoximetasone (TOPICORT) 0.25 % topical cream Apply  to affected area two (2) times a day.  100 g  0   ??? triamterene-hydrochlorothiazide (MAXZIDE) 75-50 mg per tablet Take 1 Tab by mouth daily.  30 Tab  2   ??? HYDROcodone-acetaminophen (NORCO) 10-325 mg tablet Take 1 tablet by mouth every six (6) hours as needed for Pain.  20 tablet  0     No current facility-administered medications on file prior to visit.       Filed Vitals:    09/11/13 1339   BP: 118/80   Pulse: 92   Temp: 98 ??F (36.7 ??C)   TempSrc: Temporal   Resp: 18   Height: 5\' 2"  (1.575 m)   Weight: 179 lb (81.194 kg)   SpO2: 98%   .  Body mass index is 32.73 kg/(m^2).          Physical Exam   Nursing note and vitals reviewed.  Constitutional: She is oriented to person, place, and time. She appears well-developed and well-nourished. No distress.   HENT:   Head: Normocephalic and atraumatic.   Right Ear: External ear normal.   Left Ear: External ear normal.   Nose: Nose normal.   Mouth/Throat: Oropharynx is clear and moist. No oropharyngeal exudate.   Eyes: Conjunctivae are normal. Pupils are equal, round, and reactive to light. No scleral icterus.   Neck: Normal range of motion. Neck supple. No JVD present. No tracheal deviation present.   Cardiovascular: Normal rate, regular rhythm, normal heart sounds and intact distal pulses.  Exam reveals no gallop and no friction rub.    No murmur heard.  Pulmonary/Chest: Effort normal and breath sounds normal. No respiratory distress.   Abdominal: Soft. Bowel sounds are normal. She exhibits no distension and no mass. There is no tenderness.   Musculoskeletal: Normal range of motion. She exhibits no edema.   Lymphadenopathy:     She has no cervical adenopathy.   Neurological: She  is alert and oriented to person, place, and time.   Skin: Skin is warm and dry.   Psychiatric: She has a  normal mood and affect. Her behavior is normal.       ASSESSMENT and PLAN    ICD-9-CM    1. HTN (hypertension) 401.9 CBC WITH AUTOMATED DIFF     METABOLIC PANEL, COMPREHENSIVE     LIPID PANEL   2. Asthma, mild intermittent, uncomplicated 493.00 CBC WITH AUTOMATED DIFF     METABOLIC PANEL, COMPREHENSIVE     LIPID PANEL   3. Screening cholesterol level V77.91 CBC WITH AUTOMATED DIFF     METABOLIC PANEL, COMPREHENSIVE     LIPID PANEL     Follow-up Disposition: 12 months   lab results and schedule of future lab studies reviewed with patient  reviewed diet, exercise and weight control  reviewed medications and side effects in detail  use of aspirin to prevent MI and TIA's discussed    I have discussed the above plan with the patient. Patient has also been given instructions and information on her conditions.  Patient is aware of all possible side effects and agrees with the above mentioned plan.       Donnella Bi, DO

## 2013-09-11 NOTE — Patient Instructions (Signed)
[facility]   Office working hours are Monday - Friday 8:00 am to 4:30 pm.   Saturday 9 am to 12 pm for acute illnesses only for established patients.   Office phone number is [phone] and fax is [phone].   For non-emergent medical care and clinical advice during office hours:   1. Call office or   2. Send message or request using MyChart   For non-emergent medical care and clinical advice after office hours:   1. Call [phone] or   2. Send message or request using MyChart   Emergency care can be obtained at the [facility] ER, Urgent Care or calling 911.   Patient Satisfaction Survey   We appreciate you giving us your e-mail address. Please watch for our patient satisfaction survey which you will receive by e-mail. We strive to provide you with the best care possible. We respect all comments and will take comments into consideration to improve our service.   Thank you for your participation.

## 2013-09-13 MED ORDER — TRIAMTERENE-HYDROCHLOROTHIAZIDE 75 MG-50 MG TAB
75-50 mg | ORAL_TABLET | ORAL | Status: DC
Start: 2013-09-13 — End: 2015-05-23

## 2013-09-18 LAB — CBC WITH AUTOMATED DIFF
ABS. BASOPHILS: 0 10*3/uL (ref 0.0–0.1)
ABS. EOSINOPHILS: 0.1 10*3/uL (ref 0.0–0.5)
ABS. LYMPHOCYTES: 2 10*3/uL (ref 0.8–3.5)
ABS. MONOCYTES: 0.6 10*3/uL — ABNORMAL LOW (ref 2.0–8.0)
ABS. NEUTROPHILS: 3.5 10*3/uL (ref 1.5–8.0)
BASOPHILS: 0 % (ref 0–2)
EOSINOPHILS: 1 % (ref 0–5)
HCT: 37.9 % — ABNORMAL LOW (ref 41–53)
HGB: 12.6 g/dL (ref 12.0–16.0)
LYMPHOCYTES: 32 % (ref 19–48)
MCH: 27.3 PG (ref 27–31)
MCHC: 33.2 g/dL (ref 31–37)
MCV: 82.2 FL (ref 80–100)
MONOCYTES: 10 % — ABNORMAL HIGH (ref 3–9)
MPV: 10.6 FL — ABNORMAL HIGH (ref 5.9–10.3)
NEUTROPHILS: 57 % (ref 40–74)
PLATELET: 266 10*3/uL (ref 130–400)
RBC: 4.61 M/uL (ref 4.2–5.4)
RDW: 15.3 % — ABNORMAL HIGH (ref 11.5–14.5)
WBC: 6.1 10*3/uL (ref 4.5–10.8)

## 2013-09-18 LAB — LIPID PANEL
Cholesterol, total: 180 MG/DL (ref <200)
HDL Cholesterol: 56 MG/DL (ref 32–96)
LDL, calculated: 111.36 MG/DL (ref <130)
Triglyceride: 79 MG/DL (ref <150)
VLDL, calculated: 15.8 MG/DL (ref 5–32)

## 2013-09-18 LAB — METABOLIC PANEL, COMPREHENSIVE
A-G Ratio: 0.8 — ABNORMAL LOW (ref 1.2–2.2)
ALT (SGPT): 24 U/L (ref 12–78)
AST (SGOT): 24 U/L (ref 15–37)
Albumin: 3.9 g/dL (ref 3.4–5.0)
Alk. phosphatase: 56 U/L (ref 45–117)
Anion gap: 10 mmol/L (ref 6–15)
BUN/Creatinine ratio: 10 (ref 7–25)
BUN: 9 MG/DL (ref 7–18)
Bilirubin, total: 0.5 MG/DL (ref <1.1)
CO2: 29 mmol/L (ref 21–32)
Calcium: 9.3 MG/DL (ref 8.5–10.1)
Chloride: 97 mmol/L — ABNORMAL LOW (ref 98–107)
Creatinine: 0.9 MG/DL (ref 0.60–1.30)
GFR est AA: >60 mL/min/{1.73_m2} (ref >60)
GFR est non-AA: >60 mL/min/{1.73_m2} (ref >60)
Globulin: 4.9 g/dL — ABNORMAL HIGH (ref 2.4–3.5)
Glucose: 107 mg/dL (ref 70–110)
Potassium: 3.7 mmol/L (ref 3.5–5.3)
Protein, total: 8.8 g/dL — ABNORMAL HIGH (ref 6.4–8.2)
Sodium: 136 mmol/L (ref 136–145)

## 2013-09-18 NOTE — Progress Notes (Signed)
Labs were collected @ 1045 am per Dr. Hyacinth MeekerMiller,   1-purple tube  1-green tube

## 2013-09-19 NOTE — Progress Notes (Signed)
Quick Note:    Overall labs work looks good; there are a few labs that are outside the standard normal range, clinical significance doubtful  Cholesterol looked good  ______

## 2013-09-19 NOTE — Progress Notes (Signed)
Quick Note:    Done.  ______

## 2013-09-20 NOTE — Progress Notes (Signed)
Quick Note:    DONE.  ______

## 2013-11-11 MED ORDER — FLUTICASONE 50 MCG/ACTUATION NASAL SPRAY, SUSP
50 mcg/actuation | NASAL | Status: DC
Start: 2013-11-11 — End: 2014-02-01

## 2013-11-20 MED ORDER — MONTELUKAST 10 MG TAB
10 mg | ORAL_TABLET | Freq: Every day | ORAL | Status: DC
Start: 2013-11-20 — End: 2014-06-19

## 2014-02-01 MED ORDER — METHYLPREDNISOLONE 4 MG TABS IN A DOSE PACK
4 mg | ORAL | Status: DC
Start: 2014-02-01 — End: 2014-04-16

## 2014-02-01 MED ORDER — KETOROLAC TROMETHAMINE 60 MG/2 ML IM
60 mg/2 mL | Freq: Once | INTRAMUSCULAR | Status: AC
Start: 2014-02-01 — End: 2014-02-01

## 2014-02-01 MED ORDER — POLYETHYLENE GLYCOL 3350 100 % ORAL POWDER
17 gram/dose | Freq: Every day | ORAL | Status: AC
Start: 2014-02-01 — End: ?

## 2014-02-01 MED ORDER — TIZANIDINE 2 MG CAP
2 mg | ORAL_CAPSULE | ORAL | Status: DC
Start: 2014-02-01 — End: 2014-04-16

## 2014-02-02 NOTE — Progress Notes (Signed)
Quick Note:        Xray back was unremarkable    Donnella Bi, DO        ______

## 2014-02-02 NOTE — Progress Notes (Signed)
Quick Note:        Done.    ______

## 2014-02-06 NOTE — Progress Notes (Signed)
HISTORY OF PRESENT ILLNESS  Jacqueline [name] is a 43 y.o. female.    has a past medical history of Hypertension; Asthma; Sickle cell trait ([facility]); Infertility, female; Unspecified adverse effect of anesthesia; and Nausea & vomiting.    Chief Complaint   Patient presents with   ??? Back Pain     back pain on right side of lower back radiating down right leg, says ankles are very tender, having numbness and tingling in leg also          HPI    The patient is a very pleasant 43 year old African-American white female who presents to the office today for acute exacerbation of chronic right lumbar back pain.  The patient had issues with back pain last year however this exacerbation occurred approximately three weeks ago.  She denies any trauma or initiating event that can remember.  The pain radiates into her hip and groin and down into her right ankle, especially with flexing thigh.  She describes the pain as a pressure with tenderness to touch.  The pain is constant and rates it as an 8 out of 10 but is worsened with lifting either leg including her left leg.  The pain has gotten progressive.   The pain is aggravated by sitting or lying in any position too long.  Not being in one position for to long is alleviating.  She has tried Aspirin which has not helped.  She also has a prescription for Zanaflex which she was given last year but ran out and the prescription expired.   The patient states that this is worse than her last exacerbation.  Prior x-ray listed below.   The patient has occasional numbness and tingling in her right hip to her right foot.  She states it started over the last three days.  It occurs intermittently.  She even has noted some weakness in her ankles.   The muscle relaxer and the PT, which she was referred to last year, did help her.  She denies any saddle paresthesias, loss of bowel or bladder.  She denies any falls.  No hematochezia or abdominal pain.  She does admit to some stool leakage and  notes that she has been significantly constipated.  She denies any chest pain, shortness of breath, or stroke-like symptoms.  The patient states she again does not have saddle paresthesia and can feel her flatulence and bowel movements.  The patient does not feel a decrease in her anal tone.              Review of Systems   A thorough 10 point review of systems was done and was unremarkable except for HPI and chronic medical conditions.       Psychiatric/Behavioral: Negative.      Past Medical History   Diagnosis Date   ??? Hypertension    ??? Asthma    ??? Sickle cell trait ([place])    ??? Infertility, female    ??? Unspecified adverse effect of anesthesia      Difficult intubation   ??? Nausea & vomiting      Past Surgical History   Procedure Laterality Date   ??? Pr breast surgery procedure unlisted       breast reduction   ??? Hx heent       tonsillectomy   ??? Hx other surgical       excision of lipoma, left abd     Family History   Problem Relation Age of  Onset   ??? Stroke Mother    ??? Cancer Father    ??? Heart Disease Father    ??? Diabetes Father      History     Social History   ??? Marital Status: MARRIED     Spouse Name: N/A     Number of Children: N/A   ??? Years of Education: N/A     Occupational History   ??? Not on file.     Social History Main Topics   ??? Smoking status: Never Smoker    ??? Smokeless tobacco: Never Used   ??? Alcohol Use: No   ??? Drug Use: No   ??? Sexual Activity: Not on file     Other Topics Concern   ??? Not on file     Social History Narrative     Allergies   Allergen Reactions   ??? Peanut Anaphylaxis   ??? Pcn [Penicillins] Hives   ??? Sulfa (Sulfonamide Antibiotics) Hives   ??? Ultram [Tramadol] Other (comments)     fainted     Current Outpatient Prescriptions on File Prior to Visit   Medication Sig Dispense Refill   ??? montelukast (SINGULAIR) 10 mg tablet Take 1 Tab by mouth daily. 90 Tab 1   ??? triamterene-hydrochlorothiazide (MAXZIDE) 75-50 mg per tablet TAKE 1 TABLET BY MOUTH EVERY DAY 30 Tab 5    ??? KLOR-CON 10 10 mEq tablet TAKE 2 TABS BY MOUTH TWO (2) TIMES A DAY. 120 tablet 2   ??? omeprazole (PRILOSEC) 20 mg capsule TAKE ONE CAPSULE BY MOUTH DAILY 90 capsule 1   ??? albuterol (PROAIR HFA) 90 mcg/actuation inhaler Take 2 puffs by inhalation every six (6) hours as needed for Wheezing. 1 Inhaler 11   ??? desoximetasone (TOPICORT) 0.25 % topical cream Apply  to affected area two (2) times a day. 100 g 0     No current facility-administered medications on file prior to visit.       Filed Vitals:    02/01/14 1017   BP: 120/86   Pulse: 81   Temp: 98 ??F (36.7 ??C)   TempSrc: Temporal   Resp: 18   Height: '5\' 2"'  (1.575 m)   Weight: 171 lb (77.565 kg)   SpO2: 98%   .  Body mass index is 31.27 kg/(m^2).          Physical Exam   Nursing note and vitals reviewed.  Constitutional: She is oriented to person, place, and time. She appears well-developed and well-nourished. No distress.   HENT:   Head: Normocephalic and atraumatic.   Right Ear: External ear normal. Canal erythematous   Left Ear: External ear normal. Canal erythematous   Nose: Nose normal.   Mouth/Throat: Oropharynx is clear and moist. No oropharyngeal exudate.   Eyes: Conjunctivae are normal. Pupils are equal, round, and reactive to light. No scleral icterus.   Neck: Normal range of motion. Neck supple. No JVD present. No tracheal deviation present.   Cardiovascular: Normal rate, regular rhythm, normal heart sounds and intact distal pulses.  Exam reveals no gallop and no friction rub.    No murmur heard.  Pulmonary/Chest: Effort normal and breath sounds normal. No respiratory distress. She has no wheezes.   Abdominal: Soft. Bowel sounds are normal. Mild distention.  Bowel sounds times four.  Negative Murphy's sign however patient does have some diffuse mild tenderness to palpation with palpable stool consistent of constipation.         Musculoskeletal: Thoracic and lumbar paraspinal tenderness  with associated decreased ROM and pain     Muscle strength is 5/5 in lower extremities bilaterally but does exert slightly poor effort due to pain.  Pedal pulses are present and intact.   FABER is positive with pain localizing into lumbosacral area bilaterally.   Left straight leg raise positive.   2+ reflexes in lower extremities bilaterally.          Lymphadenopathy:     She has no cervical adenopathy.   Neurological: She is alert and oriented to person, place, and time.  LE DTRs 2/4   Skin: Skin is warm and dry.    Psychiatric: She has a normal mood and affect. Her behavior is normal.     Lab Results   Component Value Date/Time    WBC 6.1 09/18/2013 10:45 AM    HGB 12.6 09/18/2013 10:45 AM    HCT 37.9 09/18/2013 10:45 AM    PLATELET 266 09/18/2013 10:45 AM    MCV 82.2 09/18/2013 10:45 AM     Lab Results   Component Value Date/Time    SODIUM 136 09/18/2013 10:45 AM    POTASSIUM 3.7 09/18/2013 10:45 AM    CHLORIDE 97 09/18/2013 10:45 AM    CO2 29 09/18/2013 10:45 AM    ANION GAP 10 09/18/2013 10:45 AM    GLUCOSE 107 09/18/2013 10:45 AM    BUN 9 09/18/2013 10:45 AM    CREATININE 0.90 09/18/2013 10:45 AM    BUN/CREATININE RATIO 10 09/18/2013 10:45 AM    GFR EST AA >60 09/18/2013 10:45 AM    GFR EST NON-AA >60 09/18/2013 10:45 AM    CALCIUM 9.3 09/18/2013 10:45 AM    BILIRUBIN, TOTAL 0.5 09/18/2013 10:45 AM    ALT 24 09/18/2013 10:45 AM    AST 24 09/18/2013 10:45 AM    ALK. PHOSPHATASE 56 09/18/2013 10:45 AM    PROTEIN, TOTAL 8.8 09/18/2013 10:45 AM    ALBUMIN 3.9 09/18/2013 10:45 AM    GLOBULIN 4.9 09/18/2013 10:45 AM    A-G RATIO 0.8 09/18/2013 10:45 AM     Lab Results   Component Value Date/Time    TSH 2.69 09/07/2012 09:50 AM     Lab Results   Component Value Date/Time    CHOLESTEROL, TOTAL 180 09/18/2013 10:45 AM    HDL CHOLESTEROL 56 09/18/2013 10:45 AM    LDL, CALCULATED 111.36 09/18/2013 10:45 AM    VLDL, CALCULATED 15.8 09/18/2013 10:45 AM    TRIGLYCERIDE 79 09/18/2013 10:45 AM       ASSESSMENT and PLAN      ICD-9-CM ICD-10-CM     1. Acute radicular low back pain 724.4 M54.10 tiZANidine (ZANAFLEX) 2 mg capsule      ketorolac tromethamine (TORADOL) 60 mg/2 mL soln      KETOROLAC TROMETHAMINE INJ      PR THER/PROPH/DIAG INJECTION, SUBCUT/IM      methylPREDNISolone (MEDROL DOSEPACK) 4 mg tablet      XR SPINE LUMB MIN 4 V   2. Constipation, unspecified constipation type 564.00 K59.00 polyethylene glycol (MIRALAX) 17 gram/dose powder         Follow-up Disposition:  1 month    lab results and schedule of future lab studies reviewed with patient  reviewed diet, exercise and weight control  cardiovascular risk and specific lipid/LDL goals reviewed  reviewed medications and side effects in detail  use of aspirin to prevent MI and TIA's discussed  radiology results and schedule of future radiology studies reviewed with patient  GO TO ED OR CALL 911 if pt  experiences ANY OF THE FOLLOWING:  chest pain, SOB., jaw/neck pain, N/V, diaphoresis, or arm pain . Pt verbally voiced understanding     I have discussed the above plan with the patient. Patient has also been given instructions and information on her conditions.  Patient is aware of all possible side effects and agrees with the above mentioned plan.     Discussed pathophysiology of illness, risks versus benefits of treatments,  & possible complications. Questions answered & treatment as per orders.    Hermenia Bers, DO

## 2014-02-07 NOTE — Addendum Note (Signed)
Addended by: Zollie PeeNOE, LAURA E on: 02/07/2014 09:43 AM      Modules accepted: Level of Service

## 2014-04-03 ENCOUNTER — Encounter: Attending: Family Medicine | Primary: Family Medicine

## 2014-04-16 ENCOUNTER — Inpatient Hospital Stay: Admit: 2014-04-16 | Payer: PRIVATE HEALTH INSURANCE | Primary: Family Medicine

## 2014-04-16 ENCOUNTER — Ambulatory Visit
Admit: 2014-04-16 | Discharge: 2014-04-16 | Payer: PRIVATE HEALTH INSURANCE | Attending: Family Medicine | Primary: Family Medicine

## 2014-04-16 DIAGNOSIS — R5383 Other fatigue: Secondary | ICD-10-CM

## 2014-04-16 DIAGNOSIS — Z23 Encounter for immunization: Secondary | ICD-10-CM

## 2014-04-16 LAB — CBC WITH AUTOMATED DIFF
ABS. BASOPHILS: 0 10*3/uL (ref 0.0–0.1)
ABS. EOSINOPHILS: 0 10*3/uL (ref 0.0–0.5)
ABS. LYMPHOCYTES: 2.2 10*3/uL (ref 0.8–3.5)
ABS. MONOCYTES: 0.7 10*3/uL — ABNORMAL LOW (ref 0.8–3.5)
ABS. NEUTROPHILS: 3.8 10*3/uL (ref 1.5–8.0)
BASOPHILS: 0 % (ref 0–2)
EOSINOPHILS: 1 % (ref 0–5)
HCT: 39 % — ABNORMAL LOW (ref 41–53)
HGB: 13.2 g/dL (ref 12.0–16.0)
LYMPHOCYTES: 33 % (ref 19–48)
MCH: 28 PG (ref 27–31)
MCHC: 33.8 g/dL (ref 31–37)
MCV: 82.8 FL (ref 80–100)
MONOCYTES: 10 % — ABNORMAL HIGH (ref 3–9)
MPV: 10.4 FL — ABNORMAL HIGH (ref 5.9–10.3)
NEUTROPHILS: 56 % (ref 40–74)
PLATELET: 265 10*3/uL (ref 130–400)
RBC: 4.71 M/uL (ref 4.2–5.4)
RDW: 15 % — ABNORMAL HIGH (ref 11.5–14.5)
WBC: 6.7 10*3/uL (ref 4.5–10.8)

## 2014-04-16 LAB — METABOLIC PANEL, COMPREHENSIVE
A-G Ratio: 1 — ABNORMAL LOW (ref 1.2–2.2)
ALT (SGPT): 37 U/L (ref 12–78)
AST (SGOT): 24 U/L (ref 15–37)
Albumin: 4.2 g/dL (ref 3.4–5.0)
Alk. phosphatase: 74 U/L (ref 45–117)
Anion gap: 11 mmol/L (ref 6–15)
BUN/Creatinine ratio: 17 (ref 7–25)
BUN: 12 MG/DL (ref 7–18)
Bilirubin, total: 0.5 MG/DL (ref <1.1)
CO2: 26 mmol/L (ref 21–32)
Calcium: 9.3 MG/DL (ref 8.5–10.1)
Chloride: 99 mmol/L (ref 98–107)
Creatinine: 0.7 MG/DL (ref 0.60–1.30)
GFR est AA: >60 mL/min/{1.73_m2} (ref >60)
GFR est non-AA: >60 mL/min/{1.73_m2} (ref >60)
Globulin: 4.1 g/dL — ABNORMAL HIGH (ref 2.4–3.5)
Glucose: 108 mg/dL (ref 70–110)
Potassium: 3.9 mmol/L (ref 3.5–5.3)
Protein, total: 8.3 g/dL — ABNORMAL HIGH (ref 6.4–8.2)
Sodium: 136 mmol/L (ref 136–145)

## 2014-04-16 LAB — TSH 3RD GENERATION: TSH: 1.91 u[IU]/mL (ref 0.35–3.74)

## 2014-04-16 LAB — T4, FREE: T4, Free: 0.9 NG/DL (ref 0.76–1.46)

## 2014-04-16 LAB — SED RATE, AUTOMATED: Sed rate, automated: 21 mm/hr (ref 0–25)

## 2014-04-16 LAB — VITAMIN D, 25 HYDROXY: Vitamin D 25-Hydroxy: 14 ng/mL — ABNORMAL LOW (ref 30–80)

## 2014-04-16 MED ORDER — AZELASTINE 137 MCG NASAL SPRAY AEROSOL
137 mcg (0.1 %) | Freq: Two times a day (BID) | NASAL | Status: DC
Start: 2014-04-16 — End: 2014-12-26

## 2014-04-16 MED ORDER — LISINOPRIL 5 MG TAB
5 mg | ORAL_TABLET | Freq: Every day | ORAL | Status: DC
Start: 2014-04-16 — End: 2015-04-05

## 2014-04-16 MED ORDER — TIZANIDINE 4 MG CAP
4 mg | ORAL_CAPSULE | ORAL | Status: AC
Start: 2014-04-16 — End: ?

## 2014-04-16 MED ORDER — FLUTICASONE 50 MCG/ACTUATION NASAL SPRAY, SUSP
50 mcg/actuation | Freq: Every day | NASAL | Status: DC
Start: 2014-04-16 — End: 2015-01-16

## 2014-04-16 NOTE — Progress Notes (Signed)
HISTORY OF PRESENT ILLNESS  Jacqueline [name] is a 43 y.o. female.    has a past medical history of Hypertension; Asthma; Sickle cell trait ([name]); Infertility, female; Unspecified adverse effect of anesthesia; and Nausea & vomiting.    Chief Complaint   Patient presents with   ??? Fatigue     SOFT TISSUE PAIN          HPI    The patient is a very pleasant 44 year old white female with the above listed past medical history who presents to the office today to follow up for longstanding hypertension and asthma. Overall, pt states she has been feeling significantly more fatigued. She tries to watch her  salt diet.     BP Readings from Last 3 Encounters:   04/16/14 140/82   02/01/14 120/86   09/11/13 118/80     Wt Readings from Last 3 Encounters:   04/16/14 178 lb (80.74 kg)   02/01/14 171 lb (77.565 kg)   09/11/13 179 lb (81.194 kg)       Patient complains of Acute worsening of long-standing      fatigue. Symptoms began problem is longstanding. The patient feels fatigue began with: symptoms of true arthritis, cold intolerance, constipation and change in hair texture., husband says she snores but refuses sleep study at point. Believes snoring is from uncontrolled environmental allergies rather than OSA.  Symptoms of her fatigue have been general malaise, diffuse soft tissue aches and pains. Patient describes the following psychologic factors: recently took job with [employer]; works from home, but states she is on the night shift; is sleeping less than 7 hours per night; averaging 5 .  The patient states that her chronic back pain has been hurting more which also hinders the restfulness of her sleep.        Patient denies significant change in weight, true exercise intolerance, unusual rashes, GI blood loss, excessive menstrual bleeding. The course has been gradually worsening. Severity has been symptoms bothersome, but easily able to carry out all usual work/school/family.        The patient presents for a longstanding asthma.  The patient states that she has not had to use her Albuterol in almost a year.  She states that mainly comes with when she has upper respiratory infections.  Her Albuterol inhaler has expired and is requesting refills today.  Overall she states she is doing well.        Preventative Health  Requests flu shot     Review of Systems   A thorough 10 point review of systems was done and was unremarkable except for HPI and chronic medical conditions.          Past Medical History   Diagnosis Date   ??? Hypertension    ??? Asthma    ??? Sickle cell trait ([name])    ??? Infertility, female    ??? Unspecified adverse effect of anesthesia      Difficult intubation   ??? Nausea & vomiting      Past Surgical History   Procedure Laterality Date   ??? Pr breast surgery procedure unlisted       breast reduction   ??? Hx heent       tonsillectomy   ??? Hx other surgical       excision of lipoma, left abd     Family History   Problem Relation Age of Onset   ??? Stroke Mother    ??? Cancer Father    ???  Heart Disease Father    ??? Diabetes Father      History     Social History   ??? Marital Status: MARRIED     Spouse Name: N/A     Number of Children: N/A   ??? Years of Education: N/A     Occupational History   ??? Not on file.     Social History Main Topics   ??? Smoking status: Never Smoker    ??? Smokeless tobacco: Never Used   ??? Alcohol Use: No   ??? Drug Use: No   ??? Sexual Activity: Not on file     Other Topics Concern   ??? Not on file     Social History Narrative     Allergies   Allergen Reactions   ??? Peanut Anaphylaxis   ??? Pcn [Penicillins] Hives   ??? Sulfa (Sulfonamide Antibiotics) Hives   ??? Ultram [Tramadol] Other (comments)     fainted     Current Outpatient Prescriptions on File Prior to Visit   Medication Sig Dispense Refill   ??? polyethylene glycol (MIRALAX) 17 gram/dose powder Take 17 g by mouth daily. Mix in 1 glass of water and drink for constipation 1530 g 0    ??? montelukast (SINGULAIR) 10 mg tablet Take 1 Tab by mouth daily. 90 Tab 1   ??? triamterene-hydrochlorothiazide (MAXZIDE) 75-50 mg per tablet TAKE 1 TABLET BY MOUTH EVERY DAY 30 Tab 5   ??? KLOR-CON 10 10 mEq tablet TAKE 2 TABS BY MOUTH TWO (2) TIMES A DAY. 120 tablet 2   ??? omeprazole (PRILOSEC) 20 mg capsule TAKE ONE CAPSULE BY MOUTH DAILY 90 capsule 1   ??? albuterol (PROAIR HFA) 90 mcg/actuation inhaler Take 2 puffs by inhalation every six (6) hours as needed for Wheezing. 1 Inhaler 11   ??? desoximetasone (TOPICORT) 0.25 % topical cream Apply  to affected area two (2) times a day. 100 g 0     No current facility-administered medications on file prior to visit.       Filed Vitals:    04/16/14 1126   BP: 140/82   Pulse: 98   Temp: 98.4 ??F (36.9 ??C)   TempSrc: Temporal   Resp: 18   Height: 5\' 2"  (1.575 m)   Weight: 178 lb (80.74 kg)   SpO2: 98%   .  Body mass index is 32.55 kg/(m^2).          Physical Exam   Nursing note and vitals reviewed.  Constitutional: She is oriented to person, place, and time. She appears well-developed and well-nourished. No distress.   HENT:   Head: Normocephalic and atraumatic.   Right Ear: External ear normal.  Clear serous fluid behind tempanic membrane bilaterally     Left Ear: External ear normal.   Nose: injected turbinates   Mouth/Throat: Oropharynx is clear and moist. No oropharyngeal exudate. Cobblestoning     Eyes: Conjunctivae are normal. Pupils are equal, round, and reactive to light. No scleral icterus.   Neck: Normal range of motion. Neck supple. No JVD present. No tracheal deviation present. Positive thyromegaly.          Cardiovascular: Normal rate, regular rhythm, normal heart sounds and intact distal pulses.  Exam reveals no gallop and no friction rub.    No murmur heard.  Pulmonary/Chest: Effort normal and breath sounds normal. No respiratory distress.   Abdominal: Soft. Bowel sounds are normal. She exhibits no distension and no mass. There is no tenderness.    Musculoskeletal:  Normal range of motion. She exhibits no edema. Tender points in distribution consistent with fibromyalgia Although patient admits to some associated joint swelling, she has no synovitis today.             Lymphadenopathy:     She has no cervical adenopathy.   Neurological: She is alert and oriented to person, place, and time.   Skin: Skin is warm and dry.   Psychiatric: She has a normal mood and affect. Her behavior is normal.       ASSESSMENT and PLAN    ICD-10-CM ICD-9-CM    1. Fatigue R53.83 780.79 CBC WITH AUTOMATED DIFF      METABOLIC PANEL, COMPREHENSIVE      TSH, 3RD GENERATION      T4, FREE      VITAMIN D, 25 HYDROXY      ANA QL, W/REFLEX CASCADE      RHEUMATOID FACTOR      SED RATE, AUTOMATED   2. Encounter for immunization Z23 V03.89 INFLUENZA VIRUS VACCINE,(SEASONAL),SPLIT, IN INDIVIDS. >=3 YRS OF AGE, IM      PR IMMUNIZ ADMIN,1 SINGLE/COMB VAC/TOXOID   3. Essential hypertension I10 401.9 CBC WITH AUTOMATED DIFF      METABOLIC PANEL, COMPREHENSIVE      lisinopril (PRINIVIL, ZESTRIL) 5 mg tablet   4. Myalgia M79.1 729.1 CBC WITH AUTOMATED DIFF      METABOLIC PANEL, COMPREHENSIVE      TSH, 3RD GENERATION      T4, FREE      VITAMIN D, 25 HYDROXY      ANA QL, W/REFLEX CASCADE      SED RATE, AUTOMATED      tiZANidine (ZANAFLEX) 4 mg capsule   5. Joint pain M25.50 719.40 CBC WITH AUTOMATED DIFF      METABOLIC PANEL, COMPREHENSIVE      TSH, 3RD GENERATION      T4, FREE      VITAMIN D, 25 HYDROXY      ANA QL, W/REFLEX CASCADE      RHEUMATOID FACTOR      SED RATE, AUTOMATED   6. Joint swelling M25.40 719.00 CBC WITH AUTOMATED DIFF      METABOLIC PANEL, COMPREHENSIVE      TSH, 3RD GENERATION      T4, FREE      VITAMIN D, 25 HYDROXY      ANA QL, W/REFLEX CASCADE      RHEUMATOID FACTOR      SED RATE, AUTOMATED   7. Acute radicular low back pain M54.10 724.4 CBC WITH AUTOMATED DIFF      METABOLIC PANEL, COMPREHENSIVE      VITAMIN D, 25 HYDROXY      ANA QL, W/REFLEX CASCADE      RHEUMATOID FACTOR       SED RATE, AUTOMATED      tiZANidine (ZANAFLEX) 4 mg capsule   8. Allergic rhinitis, unspecified allergic rhinitis type J30.9 477.9 CBC WITH AUTOMATED DIFF      METABOLIC PANEL, COMPREHENSIVE      azelastine (ASTELIN) 137 mcg (0.1 %) nasal spray      fluticasone (FLONASE) 50 mcg/actuation nasal spray   9. Thyromegaly E04.9 240.9 TSH, 3RD GENERATION      T4, FREE      THYROID ANTIBODY PANEL      US THYROID/PARATHYROID/SOFT TISS     Follow-up Disposition: 1 months   lab results and schedule of future lab studies reviewed with patient  reviewed diet, exercise and weight control  reviewed medications and side effects in detail  use of aspirin to prevent MI and TIA's discussed    I have discussed the above plan with the patient. Patient has also been given instructions and information on her conditions.  Patient is aware of all possible side effects and agrees with the above mentioned plan.     Discussed pathophysiology of illness, risks versus benefits of treatments,  & possible complications. Questions answered & treatment as per orders. Total time = 25 minutes with over 50% being counseling & co-ordination of care.      Donnella BiLauren E Jerrilyn Messinger, DO

## 2014-04-16 NOTE — Patient Instructions (Signed)
Influenza (Flu) Vaccine: After Your Visit  Your Care Instructions  Influenza (flu) is an infection in the lungs and breathing passages. It is caused by the influenza virus. There are different strains, or types, of the flu virus every year. The flu comes on quickly. It can cause a cough, stuffy nose, fever, chills, tiredness, and aches and pains. These symptoms may last up to 10 days. The flu can make you feel very sick, but most of the time it doesn't lead to other problems. But it can cause serious problems in people who are older or who have a long-term illness, such as heart disease or diabetes.  You can help prevent the flu by getting a flu vaccine every year, as soon as it is available. It is given as a shot or in a nasal spray. The viruses in the flu shot are dead, and the nasal spray (FluMist) has weakened live viruses. You cannot get the flu from the shot or the spray. FluMist can be given to healthy people from ages 2 through 49. FluMist is not approved for pregnant women. The vaccine prevents most cases of the flu. But even when the vaccine doesn't prevent the flu, it can make symptoms less severe and reduce the chance of problems from the flu.  Sometimes, young children and people who have an immune system problem may have a slight fever or muscle aches or pains 6 to 12 hours after getting the shot. These symptoms usually last 1 or 2 days.  Follow-up care is a key part of your treatment and safety. Be sure to make and go to all appointments, and call your doctor if you are having problems. It's also a good idea to know your test results and keep a list of the medicines you take.  Who should get the flu vaccine?  Everyone age 6 months or older should get a flu vaccine each year. It lowers the chance of getting and spreading the flu. The vaccine is very important for people who are at high risk for getting other health problems from the flu. This includes:  ?? Anyone 50 years of age or older.   ?? People who live in a long-term care center, such as a nursing home.  ?? All children 6 months through 18 years of age.  ?? Adults and children 6 months and older who have long-term heart or lung problems, such as asthma.  ?? Adults and children 6 months and older who needed medical care or were in a hospital during the past year because of diabetes, chronic kidney disease, or a weak immune system (including HIV or AIDS).  ?? Women who will be pregnant during the flu season.  ?? People who have any condition that can make it hard to breathe or swallow (such as a brain injury or muscle disorders).  ?? People who can give the flu to others who are at high risk for problems from the flu. This includes all health care workers and close contacts of people age 65 or older.  Who should not get the flu vaccine?  The person who gives the vaccine may tell you not to get it if you:  ?? Have a severe allergy to eggs or any part of the vaccine.  ?? Have had a severe reaction to a flu vaccine in the past.  ?? Have had Guillain-Barr?? syndrome (GBS).  ?? Are sick with a fever. (Get the vaccine when symptoms are gone.)  How can you care   for yourself at home?  ?? If you or your child has a sore arm or a slight fever after the shot, take an over-the-counter pain medicine, such as acetaminophen (Tylenol) or ibuprofen (Advil, Motrin). Read and follow all instructions on the label. Do not give aspirin to anyone younger than 20. It has been linked to Reye syndrome, a serious illness.  ?? Do not take two or more pain medicines at the same time unless the doctor told you to. Many pain medicines have acetaminophen, which is Tylenol. Too much acetaminophen (Tylenol) can be harmful.  When should you call for help?  Call 911 anytime you think you may need emergency care. For example, call if after getting the flu vaccine:  ?? You have symptoms of a severe reaction to the flu vaccine. Symptoms of a severe reaction may include:   ?? Severe difficulty breathing.  ?? Sudden raised, red areas (hives) all over your body.  ?? Severe lightheadedness.  Call your doctor now or seek immediate medical care if after getting the flu vaccine:  ?? You think you are having a reaction to the flu vaccine, such as a new fever.  Watch closely for changes in your health, and be sure to contact your doctor if you have any problems.   Where can you learn more?   Go to http://www.healthwise.net/[facility]  Enter N880 in the search box to learn more about "Influenza (Flu) Vaccine: After Your Visit."   ?? 2006-2015 [employer]. Care instructions adapted under license by [employer] (which disclaims liability or warranty for this information). This care instruction is for use with your licensed healthcare professional. If you have questions about a medical condition or this instruction, always ask your healthcare professional. Healthwise, Incorporated disclaims any warranty or liability for your use of this information.  Content Version: 10.5.422740; Current as of: August 11, 2013

## 2014-04-18 LAB — ANA QL, W/REFLEX CASCADE
ANA DIRECT, 165188: NEGATIVE
ANA Direct: NEGATIVE

## 2014-04-18 LAB — THYROID ANTIBODY PANEL
Thyroglobulin Ab: <1 IU/mL (ref 0.0–0.9)
Thyroid peroxidase Ab: 11 IU/mL (ref 0–34)

## 2014-04-18 LAB — RHEUMATOID FACTOR
Rheumatoid Factor: 15.8 IU/mL — ABNORMAL HIGH (ref 0.0–13.9)
Rheumatoid factor: 15.8 IU/mL — ABNORMAL HIGH (ref 0.0–13.9)

## 2014-04-23 NOTE — Progress Notes (Signed)
Quick Note:        Have pt come in to discuss labs    ______

## 2014-04-24 ENCOUNTER — Inpatient Hospital Stay: Admit: 2014-04-24 | Payer: PRIVATE HEALTH INSURANCE | Attending: Family Medicine | Primary: Family Medicine

## 2014-04-24 DIAGNOSIS — E049 Nontoxic goiter, unspecified: Secondary | ICD-10-CM

## 2014-04-24 NOTE — Progress Notes (Signed)
Quick Note:        Sent to leslie to sched. Apt . For pt.    ______

## 2014-05-24 ENCOUNTER — Ambulatory Visit
Admit: 2014-05-24 | Discharge: 2014-05-24 | Payer: PRIVATE HEALTH INSURANCE | Attending: Family Medicine | Primary: Family Medicine

## 2014-05-24 DIAGNOSIS — E559 Vitamin D deficiency, unspecified: Secondary | ICD-10-CM

## 2014-05-24 MED ORDER — ALBUTEROL SULFATE HFA 90 MCG/ACTUATION AEROSOL INHALER
90 mcg/actuation | Freq: Four times a day (QID) | RESPIRATORY_TRACT | Status: AC | PRN
Start: 2014-05-24 — End: ?

## 2014-05-24 MED ORDER — ERGOCALCIFEROL (VITAMIN D2) 50,000 UNIT CAP
1250 mcg (50,000 unit) | ORAL_CAPSULE | ORAL | Status: DC
Start: 2014-05-24 — End: 2014-05-29

## 2014-05-24 MED ORDER — DESOXIMETASONE 0.25 % TOPICAL CREAM
0.25 % | Freq: Two times a day (BID) | CUTANEOUS | Status: DC
Start: 2014-05-24 — End: 2015-05-23

## 2014-05-24 NOTE — Progress Notes (Signed)
HISTORY OF PRESENT ILLNESS  Jacqueline [name] is a 43 y.o. female.    has a past medical history of Hypertension; Asthma; Sickle cell trait ([name]); Infertility, female; Unspecified adverse effect of anesthesia; and Nausea & vomiting.    Chief Complaint   Patient presents with   ??? Follow-up     labs, ultrasounds of thryroid, says her hair is falling out         HPI    The patient is a very pleasant 43 year old white female with the above listed past medical history who presents to the office today to follow up for longstanding hypertension and asthma. Overall, pt states she has been feeling significantly more fatigued. Little to no change since prior visit   Patient her to follow-up complaints of Acute worsening of long-standing fatigue. Symptoms began problem is longstanding. The patient feels fatigue began with: symptoms of true arthritis, cold intolerance, constipation and change in hair texture., husband says she snores but refuses sleep study at point. Believes snoring is from uncontrolled environmental allergies rather than OSA.  Symptoms of her fatigue have been general malaise, diffuse soft tissue aches and pains. Patient describes the following psychologic factors: recently took job with [employer]; works from home, but states she is on the night shift; is sleeping less than 7 hours per night; averaging 5 .  The patient states that her chronic back pain has been hurting more which also hinders the restfulness of her sleep.        Patient denies significant change in weight, true exercise intolerance, unusual rashes, GI blood loss, excessive menstrual bleeding. The course has been gradually worsening. Severity has been symptoms bothersome, but easily able to carry out all usual work/school/family.     The patient's ultrasound of her thyroid showed slight heterogenicity and mild thyromegaly.  CBC was relatively unremarkable.  Sodium, potassium,  chloride, glucose, BUN, creatinine unremarkable.  Slight elevation in total protein but otherwise LFT was unremarkable.  TSH was within normal limits and 1.9.  T4 was within normal range at 0.9.  Vitamin D was significantly depressed at 14.  ANA was negative.  Rheumatoid factor was positive with a quantitative level of 15.8.  Sed rate, however, was only 21.  Thyroid antibody panel was negative.          Review of Systems   A thorough 10 point review of systems was done and was unremarkable except for HPI and chronic medical conditions.          Past Medical History   Diagnosis Date   ??? Hypertension    ??? Asthma    ??? Sickle cell trait ([name])    ??? Infertility, female    ??? Unspecified adverse effect of anesthesia      Difficult intubation   ??? Nausea & vomiting      Past Surgical History   Procedure Laterality Date   ??? Pr breast surgery procedure unlisted       breast reduction   ??? Hx heent       tonsillectomy   ??? Hx other surgical       excision of lipoma, left abd     Family History   Problem Relation Age of Onset   ??? Stroke Mother    ??? Cancer Father    ??? Heart Disease Father    ??? Diabetes Father      History     Social History   ??? Marital Status: MARRIED     Spouse  Name: N/A     Number of Children: N/A   ??? Years of Education: N/A     Occupational History   ??? Not on file.     Social History Main Topics   ??? Smoking status: Never Smoker    ??? Smokeless tobacco: Never Used   ??? Alcohol Use: No   ??? Drug Use: No   ??? Sexual Activity: Not on file     Other Topics Concern   ??? Not on file     Social History Narrative     Allergies   Allergen Reactions   ??? Peanut Anaphylaxis   ??? Almond Other (comments)   ??? Pcn [Penicillins] Hives   ??? Sulfa (Sulfonamide Antibiotics) Hives   ??? Ultram [Tramadol] Other (comments)     fainted     Current Outpatient Prescriptions on File Prior to Visit   Medication Sig Dispense Refill   ??? tiZANidine (ZANAFLEX) 4 mg capsule TAKE ONE CAPSULE BY MOUTH 3 TIMES A DAY 90 Cap 2    ??? lisinopril (PRINIVIL, ZESTRIL) 5 mg tablet Take 1 Tab by mouth daily. 90 Tab 2   ??? polyethylene glycol (MIRALAX) 17 gram/dose powder Take 17 g by mouth daily. Mix in 1 glass of water and drink for constipation 1530 g 0   ??? montelukast (SINGULAIR) 10 mg tablet Take 1 Tab by mouth daily. 90 Tab 1   ??? triamterene-hydrochlorothiazide (MAXZIDE) 75-50 mg per tablet TAKE 1 TABLET BY MOUTH EVERY DAY 30 Tab 5   ??? KLOR-CON 10 10 mEq tablet TAKE 2 TABS BY MOUTH TWO (2) TIMES A DAY. 120 tablet 2   ??? omeprazole (PRILOSEC) 20 mg capsule TAKE ONE CAPSULE BY MOUTH DAILY 90 capsule 1   ??? azelastine (ASTELIN) 137 mcg (0.1 %) nasal spray 1 Spray by Both Nostrils route two (2) times a day. 1 Bottle 2   ??? fluticasone (FLONASE) 50 mcg/actuation nasal spray 2 Sprays by Both Nostrils route daily. 1 Bottle 2     No current facility-administered medications on file prior to visit.       Filed Vitals:    05/24/14 1055   BP: 118/72   Pulse: 93   Temp: 98.2 ??F (36.8 ??C)   TempSrc: Temporal   Resp: 18   Height: 5\' 2"  (1.575 m)   Weight: 180 lb (81.647 kg)   SpO2: 99%   .  Body mass index is 32.91 kg/(m^2).          Physical Exam   Nursing note and vitals reviewed.  Constitutional: She is oriented to person, place, and time. She appears well-developed and well-nourished. No distress.   HENT:   Head: Normocephalic and atraumatic.   Right Ear: External ear normal.  Clear serous fluid behind tempanic membrane bilaterally     Left Ear: External ear normal.   Nose: injected turbinates   Mouth/Throat: Oropharynx is clear and moist. No oropharyngeal exudate. Cobblestoning     Eyes: Conjunctivae are normal. Pupils are equal, round, and reactive to light. No scleral icterus.   Neck: Normal range of motion. Neck supple. No JVD present. No tracheal deviation present. Positive thyromegaly.          Cardiovascular: Normal rate, regular rhythm, normal heart sounds and intact distal pulses.  Exam reveals no gallop and no friction rub.    No murmur heard.   Pulmonary/Chest: Effort normal and breath sounds normal. No respiratory distress.   Abdominal: Soft. Bowel sounds are normal. She exhibits no distension and  no mass. There is no tenderness.   Musculoskeletal: Normal range of motion. She exhibits no edema. Tender points in distribution consistent with fibromyalgia Although patient admits to some associated joint swelling, she has no synovitis today.           Lymphadenopathy:     She has no cervical adenopathy.   Neurological: She is alert and oriented to person, place, and time.   Skin: Skin is warm and dry. eczema   Psychiatric: She has a normal mood and affect. Her behavior is normal.       ASSESSMENT and PLAN    ICD-10-CM ICD-9-CM    1. Vitamin D deficiency E55.9 268.9 VITAMIN D, 25 HYDROXY      DISCONTINUED: ergocalciferol (ERGOCALCIFEROL) 50,000 unit capsule   2. Eczema L30.9 692.9 desoximetasone (TOPICORT) 0.25 % topical cream   3. 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      Follow-up Disposition: 1 months   lab results and schedule of future lab studies reviewed with patient  reviewed diet, exercise and weight control  reviewed medications and side effects in detail  use of aspirin to prevent MI and TIA's discussed    I have discussed the above plan with the patient. Patient has also been  given instructions and information on her conditions.  Patient is aware of all possible side effects and agrees with the above mentioned plan.         Donnella BiLauren E Maci Eickholt, DO

## 2014-05-25 ENCOUNTER — Encounter

## 2014-05-28 NOTE — Telephone Encounter (Signed)
PT is requesting script for ergocalviferol. I saw that it went to [facility] but PT says they haven't received it.

## 2014-05-29 ENCOUNTER — Encounter

## 2014-05-29 MED ORDER — ERGOCALCIFEROL (VITAMIN D2) 50,000 UNIT CAP
1250 mcg (50,000 unit) | ORAL_CAPSULE | ORAL | Status: DC
Start: 2014-05-29 — End: 2014-10-02

## 2014-05-29 NOTE — Telephone Encounter (Signed)
Pres[facility] sent [facility] one.

## 2014-06-01 NOTE — Patient Instructions (Signed)
[facility]   Office working hours are Monday - Friday 8:00 am to 4:30 pm.   Saturday 9 am to 12 pm for acute illnesses only for established patients.   Office phone number is [phone] and fax is [phone].   For non-emergent medical care and clinical advice during office hours:   1. Call office or   2. Send message or request using MyChart   For non-emergent medical care and clinical advice after office hours:   1. Call [phone] or   2. Send message or request using MyChart   Emergency care can be obtained at the [facility] ER, Urgent Care or calling 911.   Patient Satisfaction Survey   We appreciate you giving us your e-mail address. Please watch for our patient satisfaction survey which you will receive by e-mail. We strive to provide you with the best care possible. We respect all comments and will take comments into consideration to improve our service.   Thank you for your participation.

## 2014-06-19 MED ORDER — MONTELUKAST 10 MG TAB
10 mg | ORAL_TABLET | ORAL | Status: DC
Start: 2014-06-19 — End: 2015-09-10

## 2014-06-26 ENCOUNTER — Inpatient Hospital Stay: Admit: 2014-06-26 | Payer: PRIVATE HEALTH INSURANCE | Attending: Family Medicine | Primary: Family Medicine

## 2014-06-26 DIAGNOSIS — Z1231 Encounter for screening mammogram for malignant neoplasm of breast: Secondary | ICD-10-CM

## 2014-08-23 ENCOUNTER — Encounter: Attending: Family Medicine | Primary: Family Medicine

## 2014-08-31 MED ORDER — POTASSIUM CHLORIDE SR 10 MEQ TAB
10 mEq | ORAL_TABLET | ORAL | Status: DC
Start: 2014-08-31 — End: 2014-12-06

## 2014-08-31 NOTE — Telephone Encounter (Signed)
Patient called for a refill on [facility] 10meq  [facility] [address]

## 2014-09-12 ENCOUNTER — Ambulatory Visit
Admit: 2014-09-12 | Discharge: 2014-09-12 | Payer: PRIVATE HEALTH INSURANCE | Attending: Family Medicine | Primary: Family Medicine

## 2014-09-12 ENCOUNTER — Inpatient Hospital Stay: Admit: 2014-09-12 | Payer: PRIVATE HEALTH INSURANCE | Primary: Family Medicine

## 2014-09-12 DIAGNOSIS — R5383 Other fatigue: Secondary | ICD-10-CM

## 2014-09-12 DIAGNOSIS — E559 Vitamin D deficiency, unspecified: Secondary | ICD-10-CM

## 2014-09-12 LAB — VITAMIN D, 25 HYDROXY: Vitamin D 25-Hydroxy: 28.3 ng/mL — ABNORMAL LOW (ref 30–80)

## 2014-09-12 NOTE — Progress Notes (Signed)
Will come back after labs

## 2014-09-13 LAB — THYROID PANEL W/TSH
Free thyroxine index: 2.8 (ref 1.4–5.2)
T3 Uptake: 32 % (ref 31–39)
T4, Total: 8.6 ug/dL (ref 4.7–13.3)
TSH: 2.31 u[IU]/mL (ref 0.35–3.74)

## 2014-09-14 LAB — SCLERODERMA (SCL-70) AB, IGG
Antiscleroderma-70 Antibodies: <0.2 AI (ref 0.0–0.9)
Scleroderma-70 Ab: <0.2 AI (ref 0.0–0.9)

## 2014-09-14 LAB — RHEUMATOID FACTOR
Rheumatoid Factor: 10.5 IU/mL (ref 0.0–13.9)
Rheumatoid factor: 10.5 IU/mL (ref 0.0–13.9)

## 2014-09-14 LAB — SJOGREN'S ABS, SSA AND SSB
Sjogren's Anti-SS-A: <0.2 AI (ref 0.0–0.9)
Sjogren's Anti-SS-B: <0.2 AI (ref 0.0–0.9)

## 2014-09-15 LAB — CYCLIC CITRUL PEPTIDE AB, IGG: CCP Antibodies IgG/IgA: 3 units (ref 0–19)

## 2014-09-17 NOTE — Progress Notes (Signed)
Quick Note:        Will discuss at follow-up in 2 weeks    ______

## 2014-10-02 ENCOUNTER — Ambulatory Visit
Admit: 2014-10-02 | Discharge: 2014-10-02 | Payer: PRIVATE HEALTH INSURANCE | Attending: Family Medicine | Primary: Family Medicine

## 2014-10-02 DIAGNOSIS — E559 Vitamin D deficiency, unspecified: Secondary | ICD-10-CM

## 2014-10-02 MED ORDER — ERGOCALCIFEROL (VITAMIN D2) 50,000 UNIT CAP
1250 mcg (50,000 unit) | ORAL_CAPSULE | ORAL | Status: DC
Start: 2014-10-02 — End: 2015-01-16

## 2014-10-02 MED ORDER — BECLOMETHASONE DIPROPIONATE 80 MCG/ACTUATION AEROSOL INHALER
80 mcg/actuation | Freq: Two times a day (BID) | RESPIRATORY_TRACT | Status: DC
Start: 2014-10-02 — End: 2015-03-12

## 2014-10-03 ENCOUNTER — Encounter

## 2014-10-14 NOTE — Patient Instructions (Signed)
[facility]   Office working hours are Monday - Friday 8:00 am to 4:30 pm.   Saturday 9 am to 12 pm for acute illnesses only for established patients.   Office phone number is [phone] and fax is [phone].   For non-emergent medical care and clinical advice during office hours:   1. Call office or   2. Send message or request using MyChart   For non-emergent medical care and clinical advice after office hours:   1. Call [phone] or   2. Send message or request using MyChart   Emergency care can be obtained at the [facility] ER, Urgent Care or calling 911.   Patient Satisfaction Survey   We appreciate you giving us your e-mail address. Please watch for our patient satisfaction survey which you will receive by e-mail. We strive to provide you with the best care possible. We respect all comments and will take comments into consideration to improve our service.   Thank you for your participation.

## 2014-10-14 NOTE — Progress Notes (Signed)
HISTORY OF PRESENT ILLNESS  Jacqueline [name] is a 44 y.o. female.    has a past medical history of Hypertension; Asthma; Sickle cell trait ([name]); Infertility, female; Unspecified adverse effect of anesthesia; and Nausea & vomiting.    Chief Complaint   Patient presents with   ??? Follow-up         HPI    The patient is a pleasant 44 year old white female who presents to the office today for follow-up on fatigue, daytime somnolence, and body aches.  The patient does admit today that she and her husband have been monitoring sleeping and she does have witnessed snoring with what they believe to be apneic spells.  The patient had extensive rheumatological labs done which were unremarkable except for vitamin D deficiency, which is actually up since last visit.  The patient's essential hypertension has been well controlled.  Blood pressure at 122/82 with a pulse of 85.   No headaches, chest pain, shortness of breath, nausea, vomiting, diarrhea, diaphoresis, substernal chest pain, jaw pain, neurological deficits.  The patient also notes that she has long-standing asthma and breathing has been well controlled.  She is using her Albuterol inhaler less than twice a week.  No nocturnal symptoms.            Review of Systems   A thorough 10 point review of systems was done and was unremarkable except for HPI and chronic medical conditions.          Past Medical History   Diagnosis Date   ??? Hypertension    ??? Asthma    ??? Sickle cell trait ([name])    ??? Infertility, female    ??? Unspecified adverse effect of anesthesia      Difficult intubation   ??? Nausea & vomiting      Past Surgical History   Procedure Laterality Date   ??? Pr breast surgery procedure unlisted       breast reduction   ??? Hx heent       tonsillectomy   ??? Hx other surgical       excision of lipoma, left abd     Family History   Problem Relation Age of Onset   ??? Stroke Mother    ??? Cancer Father    ??? Heart Disease Father    ??? Diabetes Father      History      Social History   ??? Marital Status: MARRIED     Spouse Name: N/A   ??? Number of Children: N/A   ??? Years of Education: N/A     Occupational History   ??? Not on file.     Social History Main Topics   ??? Smoking status: Never Smoker    ??? Smokeless tobacco: Never Used   ??? Alcohol Use: No   ??? Drug Use: No   ??? Sexual Activity: Not on file     Other Topics Concern   ??? Not on file     Social History Narrative     Allergies   Allergen Reactions   ??? Peanut Anaphylaxis   ??? Almond Other (comments)   ??? Pcn [Penicillins] Hives   ??? Sulfa (Sulfonamide Antibiotics) Hives   ??? Ultram [Tramadol] Other (comments)     fainted     Current Outpatient Prescriptions on File Prior to Visit   Medication Sig Dispense Refill   ??? potassium chloride SR (KLOR-CON 10) 10 mEq tablet TAKE 2 TABS BY MOUTH TWO (2)  TIMES A DAY. 120 Tab 2   ??? montelukast (SINGULAIR) 10 mg tablet TAKE 1 TAB BY MOUTH DAILY. 90 Tab 1   ??? albuterol (PROAIR HFA) 90 mcg/actuation inhaler Take 2 Puffs by inhalation every six (6) hours as needed for Wheezing. 1 Inhaler 11   ??? desoximetasone (TOPICORT) 0.25 % topical cream Apply  to affected area two (2) times a day. 100 g 0   ??? tiZANidine (ZANAFLEX) 4 mg capsule TAKE ONE CAPSULE BY MOUTH 3 TIMES A DAY 90 Cap 2   ??? azelastine (ASTELIN) 137 mcg (0.1 %) nasal spray 1 Spray by Both Nostrils route two (2) times a day. 1 Bottle 2   ??? fluticasone (FLONASE) 50 mcg/actuation nasal spray 2 Sprays by Both Nostrils route daily. 1 Bottle 2   ??? lisinopril (PRINIVIL, ZESTRIL) 5 mg tablet Take 1 Tab by mouth daily. 90 Tab 2   ??? polyethylene glycol (MIRALAX) 17 gram/dose powder Take 17 g by mouth daily. Mix in 1 glass of water and drink for constipation 1530 g 0   ??? triamterene-hydrochlorothiazide (MAXZIDE) 75-50 mg per tablet TAKE 1 TABLET BY MOUTH EVERY DAY 30 Tab 5   ??? omeprazole (PRILOSEC) 20 mg capsule TAKE ONE CAPSULE BY MOUTH DAILY 90 capsule 1     No current facility-administered medications on file prior to visit.       Filed Vitals:     10/02/14 1324   BP: 122/82   Pulse: 85   Temp: 97.6 ??F (36.4 ??C)   TempSrc: Temporal   Resp: 18   Height: 5\' 2"  (1.575 m)   Weight: 181 lb (82.101 kg)   SpO2: 97%   LMP: 09/22/2014   .  Body mass index is 33.1 kg/(m^2).          Physical Exam   Nursing note and vitals reviewed.  Constitutional: She is oriented to person, place, and time. She appears well-developed and well-nourished. No distress.   HENT:   Head: Normocephalic and atraumatic.   Right Ear: External ear normal.  Clear serous fluid behind tempanic membrane bilaterally     Left Ear: External ear normal.   Nose: injected turbinates   Mouth/Throat: Oropharynx is clear and moist. No oropharyngeal exudate. Cobblestoning     Eyes: Conjunctivae are normal. Pupils are equal, round, and reactive to light. No scleral icterus.   Neck: Normal range of motion. Neck supple. No JVD present. No tracheal deviation present. Positive thyromegaly.          Cardiovascular: Normal rate, regular rhythm, normal heart sounds and intact distal pulses.  Exam reveals no gallop and no friction rub.    No murmur heard.  Pulmonary/Chest: Effort normal and breath sounds normal. No respiratory distress.   Abdominal: Soft. Bowel sounds are normal. She exhibits no distension and no mass. There is no tenderness.   Musculoskeletal: Normal range of motion. She exhibits no edema. Tender points in distribution consistent with fibromyalgia Although patient admits to some associated joint swelling, she has no synovitis today.           Lymphadenopathy:     She has no cervical adenopathy.   Neurological: She is alert and oriented to person, place, and time.   Skin: Skin is warm and dry. eczema   Psychiatric: She has a normal mood and affect. Her behavior is normal.       ASSESSMENT and PLAN    ICD-10-CM ICD-9-CM    1. Vitamin D deficiency E55.9 268.9 ergocalciferol (ERGOCALCIFEROL) 50,000 unit  capsule   2. Fatigue, unspecified type R53.83 780.79 ergocalciferol (ERGOCALCIFEROL)  50,000 unit capsule      REFERRAL TO SLEEP STUDIES   3. Sleep apnea, unspecified type G47.30 780.57 REFERRAL TO SLEEP STUDIES   4. Essential hypertension I10 401.9    5. RAD (reactive airway disease), unspecified asthma severity, uncomplicated J45.909 493.90    Discussed better sleep hygiene in detail.  Continue current treatment for HTN and RAD       Follow-up Disposition: 1 months   lab results and schedule of future lab studies reviewed with patient  reviewed diet, exercise and weight control  reviewed medications and side effects in detail  use of aspirin to prevent MI and TIA's discussed    I have discussed the above plan with the patient. Patient has also been given instructions and information on her conditions.  Patient is aware of all possible side effects and agrees with the above mentioned plan.         Donnella Bi, DO

## 2014-10-25 ENCOUNTER — Encounter

## 2014-10-25 NOTE — Telephone Encounter (Signed)
Need to notify pt that insurance would only approve a home sleep study. Left non detailed message [facility].

## 2014-10-30 ENCOUNTER — Ambulatory Visit: Payer: PRIVATE HEALTH INSURANCE | Primary: Family Medicine

## 2014-11-13 ENCOUNTER — Inpatient Hospital Stay: Admit: 2014-11-13 | Payer: PRIVATE HEALTH INSURANCE | Primary: Family Medicine

## 2014-11-13 ENCOUNTER — Encounter: Primary: Family Medicine

## 2014-11-13 DIAGNOSIS — G4730 Sleep apnea, unspecified: Secondary | ICD-10-CM

## 2014-12-06 MED ORDER — KLOR-CON 10 MEQ TABLET,EXTENDED RELEASE
10 mEq | ORAL_TABLET | ORAL | Status: DC
Start: 2014-12-06 — End: 2015-02-05

## 2014-12-07 ENCOUNTER — Encounter

## 2014-12-11 DIAGNOSIS — G4730 Sleep apnea, unspecified: Secondary | ICD-10-CM

## 2014-12-12 ENCOUNTER — Inpatient Hospital Stay: Admit: 2014-12-12 | Payer: PRIVATE HEALTH INSURANCE | Primary: Family Medicine

## 2014-12-12 NOTE — Progress Notes (Signed)
Patient arrived at sleep lab and was orientated to room, facility and shown the CPAP DVD. The patient was asked the pre study questions and had no questions prior to the study starting. Lights out at 2242 with the patient on a Fisher/ Paykell Eson small nasal mask with beginning settings of 4 CmH20  Cflex 3 and Humidity of 1. Patient went to the bathroom at 0040 to [phone] fixed left leg lead. Lights on at 0518 with ending settings of 8 CmH2o  cflex3 humidity 1. Patient answered post study questions and received  A DME info sheet and the white phone card. The patient had no questions post study at this time and has no picked a DME . Luisa Hart Terkhorn CRT

## 2014-12-26 ENCOUNTER — Emergency Department: Admit: 2014-12-26 | Payer: PRIVATE HEALTH INSURANCE | Primary: Family Medicine

## 2014-12-26 ENCOUNTER — Inpatient Hospital Stay
Admit: 2014-12-26 | Discharge: 2014-12-26 | Disposition: A | Discharge status: Home / Self Care | Payer: PRIVATE HEALTH INSURANCE | Attending: Emergency Medicine

## 2014-12-26 DIAGNOSIS — R079 Chest pain, unspecified: Secondary | ICD-10-CM

## 2014-12-26 LAB — RETICULOCYTE COUNT: Reticulocyte count: 1.9 % — ABNORMAL HIGH (ref 1.0–1.5)

## 2014-12-26 LAB — CBC WITH AUTOMATED DIFF
ABS. BASOPHILS: 0 10*3/uL (ref 0.0–0.1)
ABS. EOSINOPHILS: 0.2 10*3/uL (ref 0.0–0.5)
ABS. LYMPHOCYTES: 3 10*3/uL (ref 0.8–3.5)
ABS. MONOCYTES: 0.7 10*3/uL — ABNORMAL LOW (ref 2.0–8.0)
ABS. NEUTROPHILS: 5 10*3/uL (ref 1.5–8.0)
BASOPHILS: 0 % (ref 0–2)
EOSINOPHILS: 2 % (ref 0–5)
HCT: 40.6 % — ABNORMAL LOW (ref 41–53)
HGB: 14 g/dL (ref 12.0–16.0)
LYMPHOCYTES: 34 % (ref 19–48)
MCH: 28.4 PG (ref 27–31)
MCHC: 34.5 g/dL (ref 31–37)
MCV: 82.4 FL (ref 80–100)
MONOCYTES: 8 % (ref 3–9)
MPV: 9.7 FL (ref 5.9–10.3)
NEUTROPHILS: 56 % (ref 40–74)
PLATELET: 324 10*3/uL (ref 130–400)
RBC: 4.93 M/uL (ref 4.2–5.4)
RDW: 15 % — ABNORMAL HIGH (ref 11.5–14.5)
WBC: 8.9 10*3/uL (ref 4.5–10.8)

## 2014-12-26 LAB — METABOLIC PANEL, COMPREHENSIVE
A-G Ratio: 1 — ABNORMAL LOW (ref 1.2–2.2)
ALT (SGPT): 24 U/L (ref 12–78)
AST (SGOT): 23 U/L (ref 15–37)
Albumin: 4.5 g/dL (ref 3.4–5.0)
Alk. phosphatase: 92 U/L (ref 45–117)
Anion gap: 9 mmol/L (ref 6–15)
BUN/Creatinine ratio: 11 (ref 7–25)
BUN: 12 MG/DL (ref 7–18)
Bilirubin, total: 0.7 MG/DL (ref <1.1)
CO2: 28 mmol/L (ref 21–32)
Calcium: 9.8 MG/DL (ref 8.5–10.1)
Chloride: 100 mmol/L (ref 98–107)
Creatinine: 1.09 MG/DL (ref 0.60–1.30)
GFR est AA: >60 mL/min/{1.73_m2} (ref >60)
GFR est non-AA: 55 mL/min/{1.73_m2} — ABNORMAL LOW (ref >60)
Globulin: 4.6 g/dL — ABNORMAL HIGH (ref 2.4–3.5)
Glucose: 122 mg/dL — ABNORMAL HIGH (ref 70–110)
Potassium: 3.5 mmol/L (ref 3.5–5.3)
Protein, total: 9.1 g/dL — ABNORMAL HIGH (ref 6.4–8.2)
Sodium: 137 mmol/L (ref 136–145)

## 2014-12-26 LAB — EKG, 12 LEAD, INITIAL
Atrial Rate: 99 {beats}/min
Calculated P Axis: 55 degrees
Calculated R Axis: 30 degrees
Calculated T Axis: 26 degrees
Diagnosis: NORMAL
P-R Interval: 126 ms
Q-T Interval: 340 ms
QRS Duration: 70 ms
QTC Calculation (Bezet): 436 ms
Ventricular Rate: 99 {beats}/min

## 2014-12-26 LAB — TROPONIN I: Troponin-I, Qt.: <0.02 ng/mL (ref 0.00–0.05)

## 2014-12-26 LAB — MYCOPLASMA AB, IGM: Mycoplasma Ab, IgM: NEGATIVE

## 2014-12-26 LAB — D DIMER: D DIMER: 0.91 ug/ml(FEU) — CR (ref <0.49)

## 2014-12-26 LAB — HCG QL SERUM: HCG, Ql.: NEGATIVE

## 2014-12-26 LAB — D-DIMER, QUANTITATIVE: D-Dimer, Quant: 0.91 ug/ml(FEU) (ref <0.49)

## 2014-12-26 MED ORDER — LORAZEPAM 2 MG/ML IJ SOLN
2 mg/mL | INTRAMUSCULAR | Status: AC
Start: 2014-12-26 — End: 2014-12-26
  Administered 2014-12-26: 21:00:00 via INTRAVENOUS

## 2014-12-26 MED ORDER — MORPHINE 2 MG/ML INJECTION
2 mg/mL | INTRAMUSCULAR | Status: DC
Start: 2014-12-26 — End: 2014-12-26

## 2014-12-26 MED ORDER — SODIUM CHLORIDE 0.9% BOLUS IV
0.9 % | Freq: Once | INTRAVENOUS | Status: AC
Start: 2014-12-26 — End: 2014-12-26
  Administered 2014-12-26: 19:00:00 via INTRAVENOUS

## 2014-12-26 MED ORDER — IOPAMIDOL 76 % IV SOLN
370 mg iodine /mL (76 %) | Freq: Once | INTRAVENOUS | Status: AC
Start: 2014-12-26 — End: 2014-12-26
  Administered 2014-12-26: 20:00:00 via INTRAVENOUS

## 2014-12-26 MED ORDER — ASPIRIN 325 MG TAB
325 mg | ORAL | Status: AC
Start: 2014-12-26 — End: 2014-12-26
  Administered 2014-12-26: 19:00:00 via ORAL

## 2014-12-26 MED ORDER — ONDANSETRON (PF) 4 MG/2 ML INJECTION
4 mg/2 mL | INTRAMUSCULAR | Status: AC
Start: 2014-12-26 — End: 2014-12-26
  Administered 2014-12-26: 19:00:00 via INTRAVENOUS

## 2014-12-26 MED FILL — LORAZEPAM 2 MG/ML IJ SOLN: 2 mg/mL | INTRAMUSCULAR | Qty: 1

## 2014-12-26 MED FILL — ONDANSETRON (PF) 4 MG/2 ML INJECTION: 4 mg/2 mL | INTRAMUSCULAR | Qty: 2

## 2014-12-26 MED FILL — SODIUM CHLORIDE 0.9 % IV: INTRAVENOUS | Qty: 1000

## 2014-12-26 MED FILL — ASPIRIN 325 MG TAB: 325 mg | ORAL | Qty: 1

## 2014-12-26 MED FILL — ISOVUE-370  76 % INTRAVENOUS SOLUTION: 370 mg iodine /mL (76 %) | INTRAVENOUS | Qty: 100

## 2014-12-26 NOTE — ED Notes (Signed)
Pt resting; friend in attendance; no current needs; states that she does not want morphine although chest pain still present

## 2014-12-26 NOTE — ED Notes (Signed)
Pt states that she had chest pain intermittently for several days; states pain presented today during lunch and did not resolve; called Dr; told to come to ED; States she has not been taking cardiac meds(lisinopril) as instructed; states partially d/t father who is ill; strong family hx of stroke and heart attack  Currently having some chest pain which she states is a "burning sensation."

## 2014-12-26 NOTE — ED Provider Notes (Signed)
HPI Comments: Patient presents with left sided chest pain and SOB.  States has been ongoing over last 2 days.  Left upper chest pain with some SOB.  Patient worse with exhaling per patient.  Denies fever, chills, dizziness/lightheadedness, nausea, vomiting, diarrhea, constipation.    Patient is a 44 y.o. female presenting with chest pain. The history is provided by the patient. No language interpreter was used.   Chest Pain (Angina)   This is a new problem. The current episode started 2 days ago. The problem has not changed since onset.The problem occurs constantly. The pain is associated with normal activity. The pain is present in the left side. The pain is at a severity of 6/10. The pain is moderate. The quality of the pain is described as pressure-like and tightness. The pain does not radiate. Associated symptoms include shortness of breath. Pertinent negatives include no abdominal pain, no back pain, no claudication, no cough, no diaphoresis, no dizziness, no fever, no headaches, no leg pain, no malaise/fatigue, no nausea, no palpitations, no vomiting and no weakness. Risk factors include hypertension.        Past Medical History:   Diagnosis Date   ??? Hypertension    ??? Asthma    ??? Sickle cell trait ([place])    ??? Infertility, female    ??? Unspecified adverse effect of anesthesia      Difficult intubation   ??? Nausea & vomiting        Past Surgical History:   Procedure Laterality Date   ??? Pr breast surgery procedure unlisted       breast reduction   ??? Hx heent       tonsillectomy   ??? Hx other surgical       excision of lipoma, left abd         Family History:   Problem Relation Age of Onset   ??? Stroke Mother    ??? Cancer Father    ??? Heart Disease Father    ??? Diabetes Father        History     Social History   ??? Marital Status: MARRIED     Spouse Name: N/A   ??? Number of Children: N/A   ??? Years of Education: N/A     Occupational History   ??? Not on file.     Social History Main Topics   ??? Smoking status: Never Smoker     ??? Smokeless tobacco: Never Used   ??? Alcohol Use: No   ??? Drug Use: No   ??? Sexual Activity: Not on file     Other Topics Concern   ??? Not on file     Social History Narrative         ALLERGIES: Peanut; Almond; Pcn; Sesame oil; Sulfa (sulfonamide antibiotics); and Ultram    Review of Systems   Constitutional: Negative for fever, chills, malaise/fatigue, diaphoresis and activity change.   HENT: Negative for congestion, drooling, ear discharge, postnasal drip, rhinorrhea, sore throat and voice change.    Eyes: Negative for pain, discharge and redness.   Respiratory: Positive for shortness of breath. Negative for cough, choking and wheezing.    Cardiovascular: Positive for chest pain. Negative for palpitations and claudication.   Gastrointestinal: Negative for nausea, vomiting, abdominal pain, diarrhea and constipation.   Endocrine: Negative for cold intolerance and polydipsia.   Genitourinary: Negative for dysuria, urgency, frequency, decreased urine volume and difficulty urinating.   Musculoskeletal: Negative for myalgias, back pain, joint swelling and  neck stiffness.   Skin: Negative for pallor and rash.   Allergic/Immunologic: Negative for environmental allergies and food allergies.   Neurological: Negative for dizziness, syncope, weakness, light-headedness and headaches.   Hematological: Negative for adenopathy.   Psychiatric/Behavioral: Negative for suicidal ideas, confusion, decreased concentration and agitation. The patient is not nervous/anxious.    All other systems reviewed and are negative.      Filed Vitals:    12/26/14 1437   BP: 147/80   Pulse: 98   Temp: 98.1 ??F (36.7 ??C)   Resp: 16   Height: 5' 2" (1.575 m)   Weight: 82.555 kg (182 lb)   SpO2: 99%            Physical Exam   Constitutional: She is oriented to person, place, and time. She appears well-developed and well-nourished.   HENT:   Head: Normocephalic.   Right Ear: External ear normal.   Left Ear: External ear normal.    Mouth/Throat: No oropharyngeal exudate.   Eyes: Conjunctivae and EOM are normal. Pupils are equal, round, and reactive to light. Right eye exhibits no discharge. Left eye exhibits no discharge.   Neck: Normal range of motion. No tracheal deviation present. No thyromegaly present.   Cardiovascular: Intact distal pulses.  Tachycardia present.  Exam reveals no gallop.    Pulmonary/Chest: Effort normal. No respiratory distress. She has no wheezes. She has no rales.   Abdominal: Soft. She exhibits no distension. There is no tenderness. There is no rebound.   Musculoskeletal: Normal range of motion. She exhibits no edema.   Lymphadenopathy:     She has no cervical adenopathy.   Neurological: She is alert and oriented to person, place, and time. She has normal reflexes. No cranial nerve deficit. Coordination normal.   Skin: Skin is warm. No rash noted. She is not diaphoretic. No erythema.   Psychiatric: She has a normal mood and affect. Her behavior is normal. Thought content normal.   Nursing note and vitals reviewed.       MDM  Number of Diagnoses or Management Options     Amount and/or Complexity of Data Reviewed  Clinical lab tests: ordered and reviewed  Tests in the radiology section of CPT??: ordered and reviewed  Review and summarize past medical records: yes  Discuss the patient with other providers: yes  Independent visualization of images, tracings, or specimens: yes    Risk of Complications, Morbidity, and/or Mortality  Presenting problems: high  Diagnostic procedures: moderate  Management options: moderate        Procedures                         2:50 PM  EKG RATE 99 Normal Sinus with Sinus Arrhythmia, QRS 70, NO ST SEGMENT ELEVATION, PR interval 126    4:33 PM  Results for orders placed or performed during the hospital encounter of 12/26/14   MYCOPLASMA AB, IGM   Result Value Ref Range    Mycoplasma Ab, IgM NEGATIVE      TROPONIN I   Result Value Ref Range    Troponin-I, Qt. <0.02 0.00 - 0.05 ng/mL    CBC WITH AUTOMATED DIFF   Result Value Ref Range    WBC 8.9 4.5 - 10.8 K/uL    RBC 4.93 4.2 - 5.4 M/uL    HGB 14.0 12.0 - 16.0 g/dL    HCT 40.6 (L) 41 - 53 %    MCV 82.4 80 - 100 FL  MCH 28.4 27 - 31 PG    MCHC 34.5 31 - 37 g/dL    RDW 15.0 (H) 11.5 - 14.5 %    PLATELET 324 130 - 400 K/uL    MPV 9.7 5.9 - 10.3 FL    NEUTROPHILS 56 40 - 74 %    LYMPHOCYTES 34 19 - 48 %    MONOCYTES 8 3 - 9 %    EOSINOPHILS 2 0 - 5 %    BASOPHILS 0 0 - 2 %    ABS. NEUTROPHILS 5.0 1.5 - 8.0 K/UL    ABS. LYMPHOCYTES 3.0 0.8 - 3.5 K/UL    ABS. MONOCYTES 0.7 (L) 2.0 - 8.0 K/UL    ABS. EOSINOPHILS 0.2 0.0 - 0.5 K/UL    ABS. BASOPHILS 0.0 0.0 - 0.1 K/UL    DF AUTOMATED     METABOLIC PANEL, COMPREHENSIVE   Result Value Ref Range    Sodium 137 136 - 145 mmol/L    Potassium 3.5 3.5 - 5.3 mmol/L    Chloride 100 98 - 107 mmol/L    CO2 28 21 - 32 mmol/L    Anion gap 9 6 - 15 mmol/L    Glucose 122 (H) 70 - 110 mg/dL    BUN 12 7 - 18 MG/DL    Creatinine 1.09 0.60 - 1.30 MG/DL    BUN/Creatinine ratio 11 7 - 25      GFR est AA >60 >60 ml/min/1.41m    GFR est non-AA 55 (L) >60 ml/min/1.790m   Calcium 9.8 8.5 - 10.1 MG/DL    Bilirubin, total 0.7 <1.1 MG/DL    ALT 24 12 - 78 U/L    AST 23 15 - 37 U/L    Alk. phosphatase 92 45 - 117 U/L    Protein, total 9.1 (H) 6.4 - 8.2 g/dL    Albumin 4.5 3.4 - 5.0 g/dL    Globulin 4.6 (H) 2.4 - 3.5 g/dL    A-G Ratio 1.0 (L) 1.2 - 2.2     D DIMER   Result Value Ref Range    D DIMER 0.91 (HH) <0.49 ug/ml(FEU)   HCG QL SERUM   Result Value Ref Range    HCG, Ql. NEGATIVE      RETICULOCYTE COUNT   Result Value Ref Range    Reticulocyte count 1.9 (H) 1.0 - 1.5 %     CTA shows no evidence of PE.      Patient seen and eval by Dr. MuSula Rumple Agree with assessment/plan.

## 2014-12-26 NOTE — ED Notes (Signed)
Patient presents to the ED for CP onset 2 days ago and SOB.

## 2014-12-26 NOTE — ED Notes (Signed)
I have reviewed discharge instructions with the patient.  The patient verbalized understanding.

## 2014-12-26 NOTE — ED Notes (Signed)
Pt refused Morphine; notified PA; pt aware that med available if needed

## 2014-12-28 NOTE — Progress Notes (Signed)
Patient called for DME choice; [facility]; order sent.

## 2014-12-31 ENCOUNTER — Inpatient Hospital Stay: Admit: 2014-12-31 | Payer: PRIVATE HEALTH INSURANCE | Primary: Family Medicine

## 2014-12-31 DIAGNOSIS — R079 Chest pain, unspecified: Secondary | ICD-10-CM

## 2014-12-31 MED ORDER — TECHNETIUM TC 99M TETROFOSMIN IV KIT
Freq: Once | Status: AC
Start: 2014-12-31 — End: 2014-12-31
  Administered 2014-12-31: 15:00:00 via INTRAVENOUS

## 2014-12-31 MED ORDER — TECHNETIUM TC 99M TETROFOSMIN IV KIT
Freq: Once | Status: AC
Start: 2014-12-31 — End: 2014-12-31
  Administered 2014-12-31: 13:00:00 via INTRAVENOUS

## 2014-12-31 MED FILL — TECHNETIUM TC 99M TETROFOSMIN IV KIT: Qty: 10

## 2014-12-31 MED FILL — TECHNETIUM TC 99M TETROFOSMIN IV KIT: Qty: 30

## 2014-12-31 NOTE — Procedures (Signed)
[place] [facility]  GXT - GRADED EXERCISE TEST    Name:  [name], [name]  MR #:  [id number]  Account #:  [id number]  DOB:  11/09/1970  Room #:    OP   Date of Service:  12/31/2014  Cardiology - GXT - GRADED EXERCISE TEST     Reason for GXT:    Chest pain    Medications:    Meds in computer   Allergies:       INTERPRETATION OF STRESS TEST:  Protocol:   Stress Myoview          Age:    43Y   Height:   62"  Weight:   182  Sex:  Female   Race:         * * * EXERCISE RESPONSE * * *    BLOOD PRESSURE      HEART RATE               MET  REST:  122/82                 REST:  76                  1 MIN: 120/60                      133  3 MIN: 122/72                      152  4 MIN: 122/72                      166  MIN:                       MIN:                       PEAK:                    PEAK:        * * * POST EXERCISE RESPONSE * * *    BLOOD PRESSURE      HEART RATE  2 MIN: 130/60                 105   4 MIN: 120/60                 81   MIN:                       MIN:                            HEART - - PREDICTED MHR:    178    MHR ACHIEVED:   93% PEAK HR  ACHIEVED:   166  TOTAL TIME:    4 min     TARGET(85% MHR):    151  STAGE:    Bruce I  TIME:     4 min     SYMPTOMS/REASON FOR STOPPING: The patient had no chest pain.  Target heart rate achieved.    PRE-EXERCISE ECG:    Sinus tachycardia, low voltage complexes.    EKG RESPONSE:  No significant ST segment changes noted.    No  arrhythmia.    PEAK ACTIVITY LEVEL:     71.    METS.     MAXIMUM 02 CONSUMPTION(VO2):  24.85   INDEX OF CARDIAC WORK:   21.58     FITNESS CLASSIFICATION:   Fair     INTERPRETATION:  1. Negative electrocardiographic exercise stress test.   2. Myoview distribution report is pending.           __________________________________  Mohd Clemons, M.D.    YP:acw   DD:  12/31/2014 00:00:00  DT:  12/31/2014 14:37:16  CC:

## 2014-12-31 NOTE — Procedures (Signed)
[place] EXERCISE TEST    Name:  Jacqueline [name], Jacqueline [name]  MR #:  [id number]  Account #:  [id number]  DOB:  [birth year]  Room #:    OP   Date of Service:  12/31/2014  Cardiology - GXT - GRADED EXERCISE TEST     Reason for GXT:    Chest pain    Medications:    Meds in computer   Allergies:       INTERPRETATION OF STRESS TEST:  Protocol:   Stress Myoview          Age:    40Y   Height:   62"  Weight:   182  Sex:  Female   Race:         * * * EXERCISE RESPONSE * * *    BLOOD PRESSURE      HEART RATE               MET  REST:  122/82                 REST:  76                  1 MIN: 120/60                      133  3 MIN: 122/72                      152  4 MIN: 122/72                      166  MIN:                       MIN:                       PEAK:                    PEAK:        * * * POST EXERCISE RESPONSE * * *    BLOOD PRESSURE      HEART RATE  2 MIN: 130/60                 105   4 MIN: 120/60                 81   MIN:                       MIN:                            HEART - - PREDICTED MHR:    178    MHR ACHIEVED:   93% PEAK HR  ACHIEVED:   166  TOTAL TIME:    4 min     TARGET(85% MHR):    151  STAGE:    Bruce I  TIME:     4 min     SYMPTOMS/REASON FOR STOPPING: The patient had no chest pain.  Target heart rate achieved.    PRE-EXERCISE ECG:    Sinus tachycardia, low voltage complexes.    EKG RESPONSE:  No significant ST segment changes noted.    No  arrhythmia.    PEAK ACTIVITY LEVEL:     71.  METS.     MAXIMUM 02 CONSUMPTION(VO2):  24.85   INDEX OF CARDIAC WORK:   21.58     FITNESS CLASSIFICATION:   Fair     INTERPRETATION:  1. Negative electrocardiographic exercise stress test.   2. Myoview distribution report is pending.           __________________________________  Adan Sis, M.D.    YP:acw   DD:  12/31/2014 00:00:00  DT:  12/31/2014 14:37:16  CC:

## 2015-01-01 ENCOUNTER — Ambulatory Visit
Admit: 2015-01-01 | Discharge: 2015-01-01 | Payer: PRIVATE HEALTH INSURANCE | Attending: Family Medicine | Primary: Family Medicine

## 2015-01-01 DIAGNOSIS — R002 Palpitations: Secondary | ICD-10-CM

## 2015-01-12 NOTE — Progress Notes (Signed)
HISTORY OF PRESENT ILLNESS  Jacqueline [name] is a 44 y.o. female.     has a past medical history of Hypertension; Asthma; Sickle cell trait ([name]); Infertility, female; Unspecified adverse effect of anesthesia; and Nausea & vomiting.    Chief Complaint   Patient presents with   ??? Follow Up Chronic Condition     was seen in ER last week for chest pain, had stress test yesterday, says her heart has raced since last week         HPI  The patient is an exceedingly pleasant 44 year old African-American female with the above listed past medical history who presents to the office today for ER follow-up.   The patient was seen last week at [facility] ER after experiencing chest tightness with associated heart palpitations.  She had Two-D echo and stress test performed yesterday however results are not back yet.   Reviewed EKG which was done in the ER.      EKG RATE 99 Normal Sinus with Sinus Arrhythmia, QRS 70, NO ST SEGMENT ELEVATION, PR interval 126  On review of labs they were relatively unremarkable except for an elevated D-Dimer.   Since she was having associated shortness of breath, she was given a CTA chest with and without contrast.   This was reviewed below.          The patient states that she has still continued to have episodes of chest tightness and intermittent heart racing since her ER visit, none as bad as what it was before but "definitely still present".  The patient denies any associated anxiety with it.  The patient denies any cough or sputum production.  She does live a fairly active lifestyle and does not note that it seems to get worse with any type of exertion.  The patient had similar episodes before when she lived in [place] and had an unremarkable Echocardiogram and Stress Test.  She has never seen Cardiologist regarding this condition before.  She tries to minimally drink caffeine and blood pressure has been well controlled with current  medications.  The patient also has a longstanding history of asthma, currently treated with Singulair, QVAR, and ProAir.  The patient states that heart racing is not related to Albuterol use and does not feel that it is the same feeling as when she has issues with her asthma.  She also has a prior history of acid reflux and states that this feels different than that as well.        Study Result    ?? CTA CHEST W WO CONT  ??  INDICATION:???? Chest pain, SOB. ??  ??  COMPARISON: No previous available.  ??  CONTRAST: 100 mL Isovue-370.  ??  FINDINGS:  ??  Heart is normal in size. No abnormal hilar, mediastinal, or axillary adenopathy.  Homogeneous enhancement central pulmonary arteries. No central embolism  identified. Assessment of the peripheral vessels is somewhat limited due to  technique/enhancement. No obvious peripheral filling defect identified. Thoracic  aorta enhances homogeneously and is normal in size.  ??  Central airway is maintained throughout. Normal lung volumes without infiltrates  or effusions. Pleural surfaces are unremarkable. The visualized upper abdomen is  unremarkable. Osseous structures are well mineralized.  ??  IMPRESSION:  1. NO EVIDENCE OF PULMONARY ARTERIAL EMBOLISM. PERIPHERAL VESSELS SOMEWHAT  LIMITED DUE TO TECHNIQUE. NO OBVIOUS FILLING DEFECTS.  2. NO ACUTE PROCESS IDENTIFIED.  ??   ??     Hx of Vitamin D  def   ROS  A thorough 10 point review of systems was done and was unremarkable except for HPI and chronic medical conditions.     Past Medical History   Diagnosis Date   ??? Hypertension    ??? Asthma    ??? Sickle cell trait ([name])    ??? Infertility, female    ??? Unspecified adverse effect of anesthesia      Difficult intubation   ??? Nausea & vomiting      Past Surgical History   Procedure Laterality Date   ??? Pr breast surgery procedure unlisted       breast reduction   ??? Hx heent       tonsillectomy   ??? Hx other surgical       excision of lipoma, left abd     Family History    Problem Relation Age of Onset   ??? Stroke Mother    ??? Cancer Father    ??? Heart Disease Father    ??? Diabetes Father      History     Social History   ??? Marital Status: MARRIED     Spouse Name: N/A   ??? Number of Children: N/A   ??? Years of Education: N/A     Occupational History   ??? Not on file.     Social History Main Topics   ??? Smoking status: Never Smoker    ??? Smokeless tobacco: Never Used   ??? Alcohol Use: No   ??? Drug Use: No   ??? Sexual Activity: Not on file     Other Topics Concern   ??? Not on file     Social History Narrative     Allergies   Allergen Reactions   ??? Peanut Anaphylaxis   ??? Almond Other (comments)   ??? Pcn [Penicillins] Hives   ??? Sesame Oil Other (comments)     Slight allergy   ??? Sulfa (Sulfonamide Antibiotics) Hives   ??? Ultram [Tramadol] Other (comments)     fainted     Current Outpatient Prescriptions on File Prior to Visit   Medication Sig Dispense Refill   ??? KLOR-CON 10 10 mEq tablet TAKE 2 TABS BY MOUTH TWO (2) TIMES A DAY. 120 Tab 2   ??? ergocalciferol (ERGOCALCIFEROL) 50,000 unit capsule Take 1 Cap by mouth every seven (7) days. 12 Cap 0   ??? beclomethasone (QVAR) 80 mcg/actuation inhaler Take 1 Puff by inhalation two (2) times a day. 8.7 g 2   ??? montelukast (SINGULAIR) 10 mg tablet TAKE 1 TAB BY MOUTH DAILY. 90 Tab 1   ??? albuterol (PROAIR HFA) 90 mcg/actuation inhaler Take 2 Puffs by inhalation every six (6) hours as needed for Wheezing. 1 Inhaler 11   ??? desoximetasone (TOPICORT) 0.25 % topical cream Apply  to affected area two (2) times a day. 100 g 0   ??? tiZANidine (ZANAFLEX) 4 mg capsule TAKE ONE CAPSULE BY MOUTH 3 TIMES A DAY 90 Cap 2   ??? fluticasone (FLONASE) 50 mcg/actuation nasal spray 2 Sprays by Both Nostrils route daily. 1 Bottle 2   ??? lisinopril (PRINIVIL, ZESTRIL) 5 mg tablet Take 1 Tab by mouth daily. 90 Tab 2   ??? polyethylene glycol (MIRALAX) 17 gram/dose powder Take 17 g by mouth daily. Mix in 1 glass of water and drink for constipation 1530 g 0    ??? triamterene-hydrochlorothiazide (MAXZIDE) 75-50 mg per tablet TAKE 1 TABLET BY MOUTH EVERY DAY 30 Tab 5   ???  omeprazole (PRILOSEC) 20 mg capsule TAKE ONE CAPSULE BY MOUTH DAILY 90 capsule 1     No current facility-administered medications on file prior to visit.       Filed Vitals:    01/01/15 1337   BP: 124/80   Pulse: 97   Temp: 98 ??F (36.7 ??C)   TempSrc: Temporal   Resp: 18   Height:  (1.575 m)   Weight: 182 lb (82.555 kg)   SpO2: 98%   LMP: 12/19/2014   .  Body mass index is 33.28 kg/(m^2).          Physical Exam   Constitutional: She is oriented to person, place, and time. She appears well-developed and well-nourished. No distress.   HENT:   Head: Normocephalic and atraumatic.   Right Ear: External ear normal.   Left Ear: External ear normal.   Nose: Nose normal.   Mouth/Throat: Oropharynx is clear and moist. No oropharyngeal exudate.   Eyes: Conjunctivae are normal. Pupils are equal, round, and reactive to light. No scleral icterus.   Neck: Normal range of motion. Neck supple. No JVD present. No tracheal deviation present.   Cardiovascular: Normal rate, regular rhythm, normal heart sounds and intact distal pulses.  Exam reveals no gallop and no friction rub.    No murmur heard.  Pulmonary/Chest: Effort normal and breath sounds normal. No respiratory distress.   Abdominal: Soft. Bowel sounds are normal. She exhibits no distension and no mass. There is no tenderness.   Musculoskeletal: Normal range of motion. She exhibits no edema.   Lymphadenopathy:     She has no cervical adenopathy.   Neurological: She is alert and oriented to person, place, and time.   Skin: Skin is warm and dry.   Psychiatric: She has a normal mood and affect. Her behavior is normal.   Nursing note and vitals reviewed.      ASSESSMENT and PLAN    ICD-10-CM ICD-9-CM    1. Heart palpitations R00.2 785.1 CARDIAC HOLTER MONITOR, 24 HOURS      2D ECHO COMPLETE ADULT (TTE) W OR WO CONTR      THYROID PANEL W/TSH      MAGNESIUM       VITAMIN D, 25 HYDROXY      REFERRAL TO CARDIOLOGY   2. OSA (obstructive sleep apnea) G47.33 327.23    3. Vitamin D deficiency E55.9 268.9 VITAMIN D, 25 HYDROXY   4. Nocturnal cough R05 786.2 REFERRAL TO PULMONARY DISEASE   5. RAD (reactive airway disease), unspecified asthma severity, uncomplicated J45.909 493.90 REFERRAL TO PULMONARY DISEASE   Will sign orders once pt has CPAP titration study   Follow-up Disposition:  Return in about 3 months (around 04/03/2015).  lab results and schedule of future lab studies reviewed with patient  reviewed diet, exercise and weight control  reviewed medications and side effects in detail  use of aspirin to prevent MI and TIA's discussed  radiology results and schedule of future radiology studies reviewed with patient    If symptoms worsen go to ER  GO TO ED OR CALL 911 if pt  experiences ANY OF THE FOLLOWING:  chest pain, SOB., jaw/neck pain, N/V, diaphoresis, or arm pain   Patient advised if symptoms worsen, if she begins having any signs of respiratory distress.  Call 911/ GO TO ED IMMEDIATELY!    I have discussed the above plan with the patient. Patient has also been given instructions and information on her conditions.  Patient is aware of  all possible side effects and agrees with the above mentioned plan.       Hermenia Bers, DO

## 2015-01-12 NOTE — Patient Instructions (Signed)
.  [facility]   Office working hours are Monday - Friday 8:00 am to 4:30 pm.   Saturday 9 am to 12 pm for acute illnesses only for established patients.   Office phone number is [phone] and fax is [phone].   For non-emergent medical care and clinical advice during office hours:   1. Call office or   2. Send message or request using MyChart   For non-emergent medical care and clinical advice after office hours:   1. Call [phone] or   2. Send message or request using MyChart   Emergency care can be obtained at the [facility] ER, Urgent Care or calling 911.   Patient Satisfaction Survey   We appreciate you giving us your e-mail address. Please watch for our patient satisfaction survey which you will receive by e-mail. We strive to provide you with the best care possible. We respect all comments and will take comments into consideration to improve our service.   Thank you for your participation.

## 2015-01-16 ENCOUNTER — Ambulatory Visit
Admit: 2015-01-16 | Discharge: 2015-01-16 | Payer: PRIVATE HEALTH INSURANCE | Attending: Interventional Cardiology | Primary: Family Medicine

## 2015-01-16 DIAGNOSIS — R002 Palpitations: Secondary | ICD-10-CM

## 2015-01-16 NOTE — Patient Instructions (Signed)
MyChart Activation    Thank you for requesting access to MyChart. Please follow the instructions below to securely access and download your online medical record. MyChart allows you to send messages to your doctor, view your test results, renew your prescriptions, schedule appointments, and more.    How Do I Sign Up?    1. In your internet browser, go to [email or web address]  2. Click on the First Time User? Click Here link in the Sign In box. You will be redirect to the New Member Sign Up page.  3. Enter your MyChart Access Code exactly as it appears below. You will not need to use this code after you???ve completed the sign-up process. If you do not sign up before the expiration date, you must request a new code.    MyChart Access Code: Activation code not generated  Current MyChart Status: Active (This is the date your MyChart access code will expire)    4. Enter the last four digits of your Social Security Number (xxxx) and Date of Birth (mm/dd/yyyy) as indicated and click Submit. You will be taken to the next sign-up page.  5. Create a MyChart ID. This will be your MyChart login ID and cannot be changed, so think of one that is secure and easy to remember.  6. Create a MyChart password. You can change your password at any time.  7. Enter your Password Reset Question and Answer. This can be used at a later time if you forget your password.   8. Enter your e-mail address. You will receive e-mail notification when new information is available in MyChart.  9. Click Sign Up. You can now view and download portions of your medical record.  10. Click the Download Summary menu link to download a portable copy of your medical information.    Additional Information    If you have questions, please visit the Frequently Asked Questions section of the MyChart website at [email or web address] Remember, MyChart is NOT to be used for urgent needs. For medical emergencies, dial 911.

## 2015-01-16 NOTE — Progress Notes (Signed)
Patient: Jacqueline [name]               Sex: female          Enc Date:  01/16/2015        Date of Birth:  1970-11-20      Age:  44 y.o.           HPI:     Jacqueline [name] is a 44 y.o. female who presents as new patient, referred by Dr Jeronimo Greaves, for palpitations.    Patient was borm at  8 months gestation. Mother had two further miscarriages    Has palpitations with associated anxiety - feels as if heart skips beats. Not every day. Went to ER with chest pain- had been having lunch eating a salad. Had a week leading up to that time. Was given an Aspirin and had A CT scan at that time. Had an elevated D Dimer. CTA was negative for PE.    Last episode of chest pain was about a week and half ago. Had a normal STress TEst      FH of CAD. Father in his 52s started having MI. Has had CVA. Fathers family has had stents.Patient has had no siblings. Mother had an aneurysm and died.    ECHO and Holter Monitor scheduled, not yet performed.    Status Provider Status       ?? Final result (12/31/2014 12:18 PM) Open    ??   Study Result    ?? ??  NUCLEAR MEDICINE MYOCARDIAL PERFUSION SCAN:?? ??  ??  REASON FOR STUDY:???? Acute chest pain  ??  PROCEDURE:???? After informed consent the patient had treadmill stress test done. ??  The patient exercised for 4.00 minutes into Bruce protocol achieving maximum  heart rate of 165 BPM that is more than [85%] of MPHR.??? At the peak heart rate  the patient was given 30.3 mCi of Myoview.???? The patient had rest imaging done  with 11.22 mCi of Myoview.???? Nuclear images were obtained as per protocol.  ??  REPORT:?? ??  ??  Review of rest and stress images reveal normal homogeneous uptake noted in both  rest and stress images..?? No definite reversibility is seen.?? ??  ??  Review of gated SPECT images reveal preserved LV systolic function with  calculated EF 77%. ??  ??  IMPRESSION: ??  ??  1.?? NORMAL MYOCARDIAL PERFUSION SCAN AT LOW TO MODERATE LEVEL OF EXERCISE AND   MORE THAN 85% OF MAXIMUM PREDICTED HEART RATE. NO EVIDENCE OF REVERSIBLE  ISCHEMIA OR PRIOR INFARCT NOTED.  ??  2.?? PRESERVED LV SYSTOLIC FUNCTION WITH CALCULATED EF OF 77%       Chief Complaint   Patient presents with   ??? Hypertension     sickle cell trait   ??? Irregular Heart Beat     palpitations   ??? Chest Pain     EKG NSR No acute ST T wave changes    Past Medical History   Diagnosis Date   ??? Hypertension    ??? Asthma    ??? Sickle cell trait ([place])    ??? Infertility, female    ??? Unspecified adverse effect of anesthesia      Difficult intubation   ??? Nausea & vomiting      Past Surgical History   Procedure Laterality Date   ??? Pr breast surgery procedure unlisted       breast reduction   ??? Hx heent  tonsillectomy   ??? Hx other surgical       excision of lipoma, left abd     Family History   Problem Relation Age of Onset   ??? Stroke Mother    ??? Cancer Father    ??? Heart Disease Father    ??? Diabetes Father      History     Social History   ??? Marital Status: MARRIED     Spouse Name: N/A   ??? Number of Children: N/A   ??? Years of Education: N/A     Social History Main Topics   ??? Smoking status: Never Smoker    ??? Smokeless tobacco: Never Used   ??? Alcohol Use: No   ??? Drug Use: No   ??? Sexual Activity: Not on file     Other Topics Concern   ??? Not on file     Social History Narrative       Allergies   Allergen Reactions   ??? Peanut Anaphylaxis   ??? Almond Other (comments)   ??? Pcn [Penicillins] Hives   ??? Sesame Oil Other (comments)     Slight allergy   ??? Sulfa (Sulfonamide Antibiotics) Hives   ??? Ultram [Tramadol] Other (comments)     fainted       Review of Systems- positives are in BOLD  Constitutional: negative for fevers, sweats, fatigue and weight loss  Head: negative for headache, tenderness, dizziness, syncope  Eyes: negative for visual field changes  Respiratory: negative for cough, sputum, hemoptysis, wheezing or dyspnea on exertion  Cardiovascular: negative for chest pain, chest pressure/discomfort,  dyspnea, palpitations, syncope, fatigue, claudication  Gastrointestinal: negative for dyspepsia, nausea, vomiting, melena and abdominal pain  Genitourinary:negative for hematuria  Musculoskeletal:negative for myalgias, muscle weakness and edema  Endocrine: negative for polydipsia, polyuria  Hematologic/Lymphatic: negative for easy bruising and lymphadenopathy  Neurological: negative for headaches, vertigo and weakness  Psychological: negative for depression, anxiety or sleep disturbance      Physical Exam:      LMP 12/19/2014     General: Alert and oriented. No acute distress.   Head NC/AT   Eyes Pink Conjunctiva anicteric sclera, reactive pupils   Neck: No Thyromegaly JVD   Mouth: moist, pink mucosa  Lungs: Clear to auscultation bilaterally.   Heart: Regular rate and rhythm, no murmur   Abdomen: Soft, non-tender. Non distended   Extremities: No edema or wounds noted  Skin:  Warm, dry and intact.  No rashes noted      Labs Reviewed:  Admission on 12/26/2014, Discharged on 12/26/2014   Component Date Value Ref Range Status   ??? Ventricular Rate 12/26/2014 99   Final   ??? Atrial Rate 12/26/2014 99   Final   ??? P-R Interval 12/26/2014 126   Final   ??? QRS Duration 12/26/2014 70   Final   ??? Q-T Interval 12/26/2014 340   Final   ??? QTC Calculation (Bezet) 12/26/2014 436   Final   ??? Calculated P Axis 12/26/2014 55   Final   ??? Calculated R Axis 12/26/2014 30   Final   ??? Calculated T Axis 12/26/2014 26   Final   ??? Diagnosis 12/26/2014    Final                    Value:Normal sinus rhythm with sinus arrhythmia  Normal ECG  When compared with ECG of 29-May-2013 12:59,  No significant change was found  Confirmed by RHODES MD, CHARLES ([id number])  on 12/26/2014 6:34:03 PM     ??? Troponin-I, Qt. 12/26/2014 <0.02  0.00 - 0.05 ng/mL Final    Comment:      * * * * * Troponin I Interp. * * * * *  Values from 0.0-0.05 are considered normal.       Values from 0.06-0.59 may be indicative of  minor cardiac injury, unstable angina or   increased risk of cardiac event. Recommend  serial monitoring.       Values >0.6 are consistent with the  WHO criteria for AMI. Recommend serial  monitoring.  * * * * * * * * * * * * * * * * * * * *     ??? WBC 12/26/2014 8.9  4.5 - 10.8 K/uL Final   ??? RBC 12/26/2014 4.93  4.2 - 5.4 M/uL Final   ??? HGB 12/26/2014 14.0  12.0 - 16.0 g/dL Final   ??? HCT 12/26/2014 40.6* 41 - 53 % Final   ??? MCV 12/26/2014 82.4  80 - 100 FL Final   ??? MCH 12/26/2014 28.4  27 - 31 PG Final   ??? MCHC 12/26/2014 34.5  31 - 37 g/dL Final   ??? RDW 12/26/2014 15.0* 11.5 - 14.5 % Final   ??? PLATELET 12/26/2014 324  130 - 400 K/uL Final   ??? MPV 12/26/2014 9.7  5.9 - 10.3 FL Final   ??? NEUTROPHILS 12/26/2014 56  40 - 74 % Final   ??? LYMPHOCYTES 12/26/2014 34  19 - 48 % Final   ??? MONOCYTES 12/26/2014 8  3 - 9 % Final   ??? EOSINOPHILS 12/26/2014 2  0 - 5 % Final   ??? BASOPHILS 12/26/2014 0  0 - 2 % Final   ??? ABS. NEUTROPHILS 12/26/2014 5.0  1.5 - 8.0 K/UL Final   ??? ABS. LYMPHOCYTES 12/26/2014 3.0  0.8 - 3.5 K/UL Final   ??? ABS. MONOCYTES 12/26/2014 0.7* 2.0 - 8.0 K/UL Final   ??? ABS. EOSINOPHILS 12/26/2014 0.2  0.0 - 0.5 K/UL Final   ??? ABS. BASOPHILS 12/26/2014 0.0  0.0 - 0.1 K/UL Final   ??? DF 12/26/2014 AUTOMATED   Final   ??? Sodium 12/26/2014 137  136 - 145 mmol/L Final   ??? Potassium 12/26/2014 3.5  3.5 - 5.3 mmol/L Final   ??? Chloride 12/26/2014 100  98 - 107 mmol/L Final   ??? CO2 12/26/2014 28  21 - 32 mmol/L Final   ??? Anion gap 12/26/2014 9  6 - 15 mmol/L Final   ??? Glucose 12/26/2014 122* 70 - 110 mg/dL Final   ??? BUN 12/26/2014 12  7 - 18 MG/DL Final   ??? Creatinine 12/26/2014 1.09  0.60 - 1.30 MG/DL Final   ??? BUN/Creatinine ratio 12/26/2014 11  7 - 25   Final   ??? GFR est AA 12/26/2014 >60  >60 ml/min/1.28m Final   ??? GFR est non-AA 12/26/2014 55* >60 ml/min/1.758mFinal    Comment: Estimated GFR is calculated using the  Modification of Diet Renal Disease(MDRD)  Study Equation,reported for both African  Americans and non-African Americans,and   normalized to 1.7371mody surface area.  The physician must decide which value  applies to the patient.The MDRD study  equation should only be used in individuals  age 75 62 older.It has not been validated  for the following:individuals over 70,40pregnant women,patients with serious  comorbid conditions,or on certain  medications,or persons with extremes  of body size,muscle mass,or nutritional  status     ??? Calcium 12/26/2014 9.8  8.5 - 10.1 MG/DL Final   ??? Bilirubin, total 12/26/2014 0.7  <1.1 MG/DL Final   ??? ALT 12/26/2014 24  12 - 78 U/L Final   ??? AST 12/26/2014 23  15 - 37 U/L Final   ??? Alk. phosphatase 12/26/2014 92  45 - 117 U/L Final   ??? Protein, total 12/26/2014 9.1* 6.4 - 8.2 g/dL Final   ??? Albumin 12/26/2014 4.5  3.4 - 5.0 g/dL Final   ??? Globulin 12/26/2014 4.6* 2.4 - 3.5 g/dL Final   ??? A-G Ratio 12/26/2014 1.0* 1.2 - 2.2   Final   ??? Mycoplasma Ab, IgM 12/26/2014 NEGATIVE    Final    Comment:      The Mycoplasma IgM test is a screening test  utilizing Rapid Enz. Immunoassay  Methodology. Due to the nature of  the test, both false positive (90%  relative specificity) and false negative  (88% relative sensitivity) may occur.  Correlation with clinical findings  is suggested.     ??? D DIMER 12/26/2014 0.91* <0.49 ug/ml(FEU) Final    Comment: A D-Dimer result less than 0.5 ug/ml FEU combined with a low clinical pretest probability of DVT and/or PE has a negative predictive value of 95-100%.  The positive predictive value is 50% or less.  Critical value verified. Called to and read back by:  BEN AT [id number] ON [id number] [facility]     ??? HCG, Ql. 12/26/2014 NEGATIVE    Final   ??? Reticulocyte count 12/26/2014 1.9* 1.0 - 1.5 % Final     CBC:   Lab Results   Component Value Date/Time    WBC 8.9 12/26/2014 03:10 PM    RBC 4.93 12/26/2014 03:10 PM    HGB 14.0 12/26/2014 03:10 PM    HCT 40.6 12/26/2014 03:10 PM    PLATELET 324 12/26/2014 03:10 PM     BMP:   Lab Results   Component Value Date/Time    GLUCOSE 122 12/26/2014 03:10 PM     SODIUM 137 12/26/2014 03:10 PM    POTASSIUM 3.5 12/26/2014 03:10 PM    CHLORIDE 100 12/26/2014 03:10 PM    CO2 28 12/26/2014 03:10 PM    BUN 12 12/26/2014 03:10 PM    CREATININE 1.09 12/26/2014 03:10 PM    CALCIUM 9.8 12/26/2014 03:10 PM     Cardiac markers: No results found for: CPK, CKND1, MYO    Imaging and Procedure:  No results found for this or any previous visit.    Cardiac Studies      Assessment/Plan     Patient Active Problem List   Diagnosis Code   ??? Chest pain R07.9   ??? Heart palpitations R00.2   ??? Peripheral edema R60.9   ??? Muscle spasms of lower extremity M62.838   ??? Sickle cell trait ([name]) D57.3   ??? HTN (hypertension) I10   ??? Asthma J45.909      PLAN:    MRI of head with contrast - follow up results of this- FH of cerebral aneurysms. Patient complaining of headache.  14 day monitor    ECHO scheduled     Advised patient to monitor if CP is related to food as it can be GB related - will monitor.    NP in  3 to 4 weeks and Dr. Blakelyn Dinges in three months.              Recommended sodium restriction. Reviewed diet, exercise and weight  control.     Copy to referring provider, Dr Jeronimo Greaves.      Darcella Gasman, RN/Intisar Claudio, MD  3:11 PM

## 2015-01-17 ENCOUNTER — Inpatient Hospital Stay: Admit: 2015-01-17 | Payer: PRIVATE HEALTH INSURANCE | Primary: Family Medicine

## 2015-01-17 DIAGNOSIS — R51 Headache: Secondary | ICD-10-CM

## 2015-01-17 MED ORDER — GADOBUTROL 7.5 MMOL/7.5 ML (1 MMOL/ML) IV
7.5 mmol/ mL (1 mmol/mL) | Freq: Once | INTRAVENOUS | Status: AC
Start: 2015-01-17 — End: 2015-01-17
  Administered 2015-01-17: 14:00:00 via INTRAVENOUS

## 2015-01-17 MED FILL — GADAVIST 7.5 MMOL/7.5 ML (1 MMOL/ML) INTRAVENOUS SOLUTION: 7.5 mmol/ mL (1 mmol/mL) | INTRAVENOUS | Qty: 15

## 2015-01-18 NOTE — Progress Notes (Signed)
Quick Note:        Pt notified    ______

## 2015-01-18 NOTE — Progress Notes (Signed)
Quick Note:        No aneurysm noted, however some other mild changes what will be discussed at next office visit    ______

## 2015-01-18 NOTE — Progress Notes (Signed)
Quick Note:        Jacqueline [name] what do you think of these results. No aneurysm but doe she need to follow up with Neuro?    [name]    ______

## 2015-01-25 ENCOUNTER — Inpatient Hospital Stay: Admit: 2015-01-25 | Payer: PRIVATE HEALTH INSURANCE | Attending: Family Medicine | Primary: Family Medicine

## 2015-01-25 ENCOUNTER — Encounter

## 2015-01-25 ENCOUNTER — Telehealth

## 2015-01-25 DIAGNOSIS — R002 Palpitations: Secondary | ICD-10-CM

## 2015-01-25 NOTE — Progress Notes (Signed)
2D Echo completed.  Dolce Sylvia, RDCS

## 2015-01-25 NOTE — Telephone Encounter (Signed)
Pt had abnormal finding on echo will order ct chest for further imaging   Full echo report in process

## 2015-01-25 NOTE — Telephone Encounter (Signed)
Pt had abnormal finding on echo, full report not ready.  However will order CT chest to further image.

## 2015-01-28 NOTE — Telephone Encounter (Signed)
Jacqueline [name], in case she would ask me, what was the abnormality?

## 2015-01-28 NOTE — Telephone Encounter (Signed)
Pt states she is still having the HAs on a regular basis.  Willing to see neurology, no preference.

## 2015-01-28 NOTE — Progress Notes (Signed)
Quick Note:        If still having headaches we will send to neuro. I wll send to Tammy to call and ask her about headaches.    ______

## 2015-01-28 NOTE — Telephone Encounter (Signed)
Let her know she had mild mitral regurgitation on her echo with normal heart function. The mild regurg is nothing that needs to have anything done about it we will follow up in a couple of years with echo.  On the MRI of the brain she did not have aneurysm which you already notified, but ask her if she is still having the headaches. If so we will send her to neurology for evaluation

## 2015-01-29 NOTE — Telephone Encounter (Signed)
Referred to Dr. Harlene Salts for evaluation of daily hjeadache

## 2015-01-29 NOTE — Addendum Note (Signed)
Addended by: Cammy Copa R on: 01/29/2015 09:53 AM      Modules accepted: Orders

## 2015-02-01 ENCOUNTER — Inpatient Hospital Stay: Admit: 2015-02-01 | Payer: PRIVATE HEALTH INSURANCE | Primary: Family Medicine

## 2015-02-01 ENCOUNTER — Institutional Professional Consult (permissible substitution): Admit: 2015-02-01 | Discharge: 2015-02-01 | Payer: PRIVATE HEALTH INSURANCE | Primary: Family Medicine

## 2015-02-01 DIAGNOSIS — R002 Palpitations: Secondary | ICD-10-CM

## 2015-02-01 DIAGNOSIS — I10 Essential (primary) hypertension: Secondary | ICD-10-CM

## 2015-02-01 LAB — MAGNESIUM: Magnesium: 2 mg/dL (ref 1.8–2.4)

## 2015-02-01 LAB — THYROID PANEL W/TSH
Free thyroxine index: 3.5 (ref 1.4–5.2)
T3 Uptake: 34 % (ref 31–39)
T4, Total: 10.4 ug/dL (ref 4.7–13.3)
TSH: 2.47 u[IU]/mL (ref 0.35–3.74)

## 2015-02-01 LAB — VITAMIN D, 25 HYDROXY: Vitamin D 25-Hydroxy: 36.3 ng/mL (ref 30–80)

## 2015-02-01 NOTE — Patient Instructions (Signed)
[facility]  Office working hours are Monday - Friday 8:00AM-5:00PM.  Office phone number is [phone] and fax is [phone].  For non-emergent medical care and clinical advice during office hours:   1. Call office or   2.  Send message or request using MyChart  For non-emergent medical care and clinical advice after office hours:  1.  Send message or request using MyChart   2.  Call [phone] and leave a message with our answering service or    Emergency care can be obtained at the [facility] ER, Urgent Care or by calling 911.    Patient Satisfaction Survey  As a valued patient, you will be receiving a survey from [employer].  We encourage you to share your thoughts and opinions about the care you received today. Thank you for choosing [facility]           BRING ALL MEDICATION BOTTLES TO EVERY APPOINTMENT    Medication Refills -   * Please do not let your medication run out before calling for a refill, the best practice would be to call when you have 2 weeks of medication left.  * Please allow 48 hours for medications to be called into your pharmacy when you request a medication refill      Have MyChart? Use it to request medication refills, it's quick and easy! If you have questions about MyChart please feel free to ask our staff!

## 2015-02-02 NOTE — Progress Notes (Signed)
Follow-up 8/16

## 2015-02-05 ENCOUNTER — Inpatient Hospital Stay: Admit: 2015-02-05 | Payer: PRIVATE HEALTH INSURANCE | Primary: Family Medicine

## 2015-02-05 ENCOUNTER — Encounter: Attending: Family Medicine | Primary: Family Medicine

## 2015-02-05 ENCOUNTER — Ambulatory Visit
Admit: 2015-02-05 | Discharge: 2015-02-05 | Payer: PRIVATE HEALTH INSURANCE | Attending: Family Medicine | Primary: Family Medicine

## 2015-02-05 DIAGNOSIS — R931 Abnormal findings on diagnostic imaging of heart and coronary circulation: Secondary | ICD-10-CM

## 2015-02-05 DIAGNOSIS — R002 Palpitations: Secondary | ICD-10-CM

## 2015-02-05 MED ORDER — IOPAMIDOL 76 % IV SOLN
370 mg iodine /mL (76 %) | Freq: Once | INTRAVENOUS | Status: AC
Start: 2015-02-05 — End: 2015-02-05
  Administered 2015-02-05: 15:00:00 via INTRAVENOUS

## 2015-02-05 MED FILL — ISOVUE-370  76 % INTRAVENOUS SOLUTION: 370 mg iodine /mL (76 %) | INTRAVENOUS | Qty: 75

## 2015-02-05 NOTE — Progress Notes (Signed)
Pt notified and voiced understanding of ct results.

## 2015-02-05 NOTE — Progress Notes (Signed)
HISTORY OF PRESENT ILLNESS  Jacqueline [name] is a 44 y.o. female.    has a past medical history of Asthma; Hypertension; Infertility, female; Nausea & vomiting; Sickle cell trait ([name]); and Unspecified adverse effect of anesthesia.    Chief Complaint   Patient presents with   ??? Follow Up Chronic Condition       HPI    Pt is a pleasant 44 yo AA female who presents to the office today to follow-up Care. She has established with Cardiology for heart palpations and Chest pains - episodes per patient are better but continues to have Nocturnal cough - using CPAP - has appointment to establish with Pulmonology in a few weeks. Cardiology ordered CT (shown below)   Had a normal STress TEst ECHO shows some valvular disease otherwise unremarkable .  She has not yet turned in her Holter Monitor.   ??FH of CAD. Father in his 74s started having MI. Has had CVA. Fathers family has had stents.Patient has had no siblings. Mother had an aneurysm and died.    Despite initiation of CPAP and nightly compliance of 6-7 hours pt still having daily headaches; has appointment scheduled with neurology this month. Does feel that it has helped with the daytime somnolence.   ????         Status Provider Status ???? ??   ???? Final result (12/31/2014 12:18 PM) Open ??   ????   Study Result    ???? ????  NUCLEAR MEDICINE MYOCARDIAL PERFUSION SCAN:?? ??  ????  REASON FOR STUDY:???? Acute chest pain  ????  PROCEDURE:???? After informed consent the patient had treadmill stress test done. ??  The patient exercised for 4.00 minutes into Bruce protocol achieving maximum  heart rate of 165 BPM that is more than [85%] of MPHR.??? At the peak heart rate  the patient was given 30.3 mCi of Myoview.???? The patient had rest imaging done  with 11.22 mCi of Myoview.???? Nuclear images were obtained as per protocol.  ????  REPORT:?? ??  ????  Review of rest and stress images reveal normal homogeneous uptake noted in both  rest and stress images..?? No definite reversibility is seen.?? ??  ????   Review of gated SPECT images reveal preserved LV systolic function with  calculated EF 77%. ??  ????  IMPRESSION: ??  ????  1.?? NORMAL MYOCARDIAL PERFUSION SCAN AT LOW TO MODERATE LEVEL OF EXERCISE AND  MORE THAN 85% OF MAXIMUM PREDICTED HEART RATE. NO EVIDENCE OF REVERSIBLE  ISCHEMIA OR PRIOR INFARCT NOTED.  ????  2.?? PRESERVED LV SYSTOLIC FUNCTION WITH CALCULATED EF OF 77%   ??    Result Information   Status Provider Status      Final result (Exam End: 02/05/2015 10:31 AM) Open    Study Result   CT CHEST W CONT  ??  INDICATION: Abnormal echo finding, chest pain and pressure.  ??  COMPARISON: No previous available.  ??  CONTRAST: 70 mL Isovue-370.  ??  FINDINGS:  ??  The heart is normal in size. Thoracic aorta is normal in size and enhances  homogeneously. No abnormal hilar, mediastinal, or axillary adenopathy. Central  airway is maintained throughout.  ??  Normal lung volumes without infiltrates or effusions. Pleural surfaces are  unremarkable. Visualized upper abdomen and osseous structures are unremarkable.  ??  IMPRESSION  IMPRESSION: NO ACUTE PROCESS IDENTIFIED.     Study Result   MRI BRAIN W WO CONT: 01/17/2015  ??  COMPARISON: None Available.  ??  REASON FOR EXAM: family history of aneurysm now with headaches.   ??  TECHNIQUE: Standard MRI brain with diffusion imaging with and without contrast  was performed.  ??  CONTRAST: 7.5 mL Gadavist  ??  FINDINGS:   ??  DWI demonstrates no abnormal signal to suggest recent infarct. Cerebellar  tonsils extend below the foramen magnum without Chiari malformation. Probable  ectopia the cerebellar tonsils. There is a 2.7 cm expansile process within the  left sphenoid sinus suggest retention cyst, mucocele. Measuring 2.78 cm.  ??  No white matter lesion, MS plaque or ventricular dilatation is seen. Typical  signal voids are seen involving the carotid siphons and basilar artery which are  patent. CP angles are clear.  ??  Postcontrast images show no enhancing lesion. The superior sagittal sinus is   patent. No evidence of acoustic neuroma. The 7th and 8th nerve are unremarkable   and show no enhancement. The pituitary is not enlarged. No obvious aneurysm or  enhancing lesion is seen.  ??  IMPRESSION:  ??  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 NO  ENHANCING LESION SEEN. 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 NO ACUTE INTRACRANIAL PROCESS OR MS PLAQUE IS SEEN.       ROS  A thorough 10 point review of systems was done and was unremarkable except for HPI and chronic medical conditions.   Past Medical History   Diagnosis Date   ??? Asthma    ??? Hypertension    ??? Infertility, female    ??? Nausea & vomiting    ??? Sickle cell trait ([name])    ??? Unspecified adverse effect of anesthesia      Difficult intubation     Past Surgical History   Procedure Laterality Date   ??? Pr breast surgery procedure unlisted       breast reduction   ??? Hx heent       tonsillectomy   ??? Hx other surgical       excision of lipoma, left abd     Family History   Problem Relation Age of Onset   ??? Stroke Mother    ??? Cancer Father    ??? Heart Disease Father    ??? Diabetes Father      Social History     Social History   ??? Marital status: MARRIED     Spouse name: N/A   ??? Number of children: N/A   ??? Years of education: N/A     Occupational History   ??? Not on file.     Social History Main Topics   ??? Smoking status: Never Smoker   ??? Smokeless tobacco: Never Used   ??? Alcohol use No   ??? Drug use: No   ??? Sexual activity: Not on file     Other Topics Concern   ??? Not on file     Social History Narrative     Allergies   Allergen Reactions   ??? Peanut Anaphylaxis   ??? Almond Other (comments)   ??? Pcn [Penicillins] Hives   ??? Sesame Oil Other (comments)     Slight allergy   ??? Sulfa (Sulfonamide Antibiotics) Hives   ??? Ultram [Tramadol] Other (comments)     fainted     Current Outpatient Prescriptions on File Prior to Visit   Medication Sig Dispense Refill    ??? montelukast (SINGULAIR) 10 mg tablet TAKE 1 TAB BY MOUTH DAILY. 90 Tab 1   ???  albuterol (PROAIR HFA) 90 mcg/actuation inhaler Take 2 Puffs by inhalation every six (6) hours as needed for Wheezing. 1 Inhaler 11   ??? desoximetasone (TOPICORT) 0.25 % topical cream Apply  to affected area two (2) times a day. 100 g 0   ??? tiZANidine (ZANAFLEX) 4 mg capsule TAKE ONE CAPSULE BY MOUTH 3 TIMES A DAY 90 Cap 2   ??? lisinopril (PRINIVIL, ZESTRIL) 5 mg tablet Take 1 Tab by mouth daily. 90 Tab 2   ??? polyethylene glycol (MIRALAX) 17 gram/dose powder Take 17 g by mouth daily. Mix in 1 glass of water and drink for constipation 1530 g 0   ??? triamterene-hydrochlorothiazide (MAXZIDE) 75-50 mg per tablet TAKE 1 TABLET BY MOUTH EVERY DAY 30 Tab 5   ??? beclomethasone (QVAR) 80 mcg/actuation inhaler Take 1 Puff by inhalation two (2) times a day. 8.7 g 2     No current facility-administered medications on file prior to visit.        Vitals:    02/05/15 1128   Pulse: 91   Resp: 18   Temp: 98.5 ??F (36.9 ??C)   TempSrc: Oral   Weight: 181 lb (82.1 kg)   Height:  (1.575 m)   .  Body mass index is 33.11 kg/(m^2).          Physical Exam    General: NAD. A x O x 3     Skin: no evidence of rashes, scars, or moles    HEENT: Head: atraumatic normocephalic, Eyes:  PERLA, EOMI Ears: without discharge or pain of external exam Nares: patent Throat: with out erythema     Neck: no thyromegaly    Heart:  RRR, S1S2, no murmur, gallop, or rub    Lungs:   CTA bilaterally     Abdomen:  Soft, nontender, BS x 4    Extremities:  Strength 4/4, no cyanosis, no edema    ASSESSMENT and PLAN    ICD-10-CM ICD-9-CM    1. Heart palpitations R00.2 785.1    2. Moderate persistent asthma without complication J45.40 493.90    3. Chest pain, unspecified type R07.9 786.50    4. Headache, chronic daily R51 784.0    Continue with specialist: cardiology, neurology, pulmonology     Follow-up Disposition: 6 months    lab results and schedule of future lab studies reviewed with patient  reviewed diet, exercise and weight control  reviewed medications and side effects in detail    I have discussed the above plan with the patient. Patient has also been given instructions and information on her conditions.  Patient is aware of all possible side effects and agrees with the above mentioned plan.       Donnella Bi, DO    radiology results and schedule of future radiology studies reviewed with patient

## 2015-02-05 NOTE — Progress Notes (Signed)
Left vm for Pt to return call for results.

## 2015-02-05 NOTE — Patient Instructions (Signed)
[facility]  Office working hours are Monday - Friday 8:00AM-5:00PM.  Office phone number is [phone] and fax is [phone].  For non-emergent medical care and clinical advice during office hours:   1. Call office or   2.  Send message or request using MyChart  For non-emergent medical care and clinical advice after office hours:  1.  Send message or request using MyChart   2.  Call [phone] and leave a message with our answering service or    Emergency care can be obtained at the [facility] ER, Urgent Care or by calling 911.    Patient Satisfaction Survey  As a valued patient, you will be receiving a survey from [employer].  We encourage you to share your thoughts and opinions about the care you received today. Thank you for choosing [facility]           BRING ALL MEDICATION BOTTLES TO EVERY APPOINTMENT    Medication Refills -   * Please do not let your medication run out before calling for a refill, the best practice would be to call when you have 2 weeks of medication left.  * Please allow 48 hours for medications to be called into your pharmacy when you request a medication refill      Have MyChart? Use it to request medication refills, it's quick and easy! If you have questions about MyChart please feel free to ask our staff!

## 2015-02-05 NOTE — Progress Notes (Signed)
No masses seen on CT scan

## 2015-02-20 ENCOUNTER — Encounter: Attending: Neurology | Primary: Family Medicine

## 2015-02-21 ENCOUNTER — Encounter: Attending: Family | Primary: Family Medicine

## 2015-03-12 ENCOUNTER — Ambulatory Visit
Admit: 2015-03-12 | Discharge: 2015-03-12 | Payer: PRIVATE HEALTH INSURANCE | Attending: Critical Care Medicine | Primary: Family Medicine

## 2015-03-12 ENCOUNTER — Encounter

## 2015-03-12 ENCOUNTER — Ambulatory Visit
Admit: 2015-03-12 | Discharge: 2015-03-12 | Payer: PRIVATE HEALTH INSURANCE | Attending: Neurology | Primary: Family Medicine

## 2015-03-12 DIAGNOSIS — G43119 Migraine with aura, intractable, without status migrainosus: Secondary | ICD-10-CM

## 2015-03-12 DIAGNOSIS — J4540 Moderate persistent asthma, uncomplicated: Secondary | ICD-10-CM

## 2015-03-12 MED ORDER — TOPIRAMATE 50 MG TAB
50 mg | ORAL_TABLET | Freq: Every evening | ORAL | 3 refills | Status: AC
Start: 2015-03-12 — End: ?

## 2015-03-12 NOTE — Progress Notes (Signed)
Chauncy Lean, MD   [address], [address] [place], [place] [address]  Phone:  [phone]  Fax:  [phone]      Patient ID  Name:  Jacqueline [name]  DOB:  1970-09-22  MRN:  [id number]  Age:  44 y.o.  PCP:  Donnella Bi, DO    Subjective:     Encounter Date:  03/12/2015    Referring Physician: No ref. provider found    Chief Complaint   Patient presents with   ??? New Patient     referred by Dr. Jilda Panda    ??? Headache     Patient suffering from headaches 1-2 every other week and 1-2 severe every month. She states it hurts to lay on the pillow and causes blurry vision.        History of Present Illness:   44 year old female with asthma, allergies, eczema come for evaluation of HA, generalized  2-3/month lasting more than one to two days. No nausea, no intolerance to light and sound. Increases on moving.  Family history of migraine,brain tumor, brain aneurysm   Low blood pressure today , pt had episodes of very high blood pressures  Pt taking tylenol at the start of HA    Current Outpatient Prescriptions on File Prior to Visit   Medication Sig Dispense Refill   ??? potassium chloride SR (KLOR-CON 10) 10 mEq tablet Take  by mouth. TAKE 2 TABS DAILY     ??? montelukast (SINGULAIR) 10 mg tablet TAKE 1 TAB BY MOUTH DAILY. 90 Tab 1   ??? albuterol (PROAIR HFA) 90 mcg/actuation inhaler Take 2 Puffs by inhalation every six (6) hours as needed for Wheezing. 1 Inhaler 11   ??? desoximetasone (TOPICORT) 0.25 % topical cream Apply  to affected area two (2) times a day. 100 g 0   ??? tiZANidine (ZANAFLEX) 4 mg capsule TAKE ONE CAPSULE BY MOUTH 3 TIMES A DAY 90 Cap 2   ??? lisinopril (PRINIVIL, ZESTRIL) 5 mg tablet Take 1 Tab by mouth daily. 90 Tab 2   ??? polyethylene glycol (MIRALAX) 17 gram/dose powder Take 17 g by mouth daily. Mix in 1 glass of water and drink for constipation 1530 g 0   ??? triamterene-hydrochlorothiazide (MAXZIDE) 75-50 mg per tablet TAKE 1 TABLET BY MOUTH EVERY DAY 30 Tab 5      No current facility-administered medications on file prior to visit.       Allergies   Allergen Reactions   ??? Peanut Anaphylaxis   ??? Almond Other (comments)   ??? Pcn [Penicillins] Hives   ??? Sesame Oil Other (comments)     Slight allergy   ??? Sulfa (Sulfonamide Antibiotics) Hives   ??? Ultram [Tramadol] Other (comments)     fainted     Patient Active Problem List   Diagnosis Code   ??? Chest pain R07.9   ??? Heart palpitations R00.2   ??? Peripheral edema R60.9   ??? Muscle spasms of lower extremity M62.838   ??? Sickle cell trait ([name]) D57.3   ??? HTN (hypertension) I10   ??? Asthma J45.909     Past Medical History   Diagnosis Date   ??? Asthma    ??? Hypertension    ??? Infertility, female    ??? Nausea & vomiting    ??? Sickle cell trait ([name])    ??? Unspecified adverse effect of anesthesia      Difficult intubation      Past Surgical History  Procedure Laterality Date   ??? Pr breast surgery procedure unlisted       breast reduction   ??? Hx heent       tonsillectomy   ??? Hx other surgical       excision of lipoma, left abd   ??? Hx other surgical  05/29/2013     tennis elbow, tendon release       Family History   Problem Relation Age of Onset   ??? Stroke Mother    ??? Cancer Father    ??? Heart Disease Father    ??? Diabetes Father       Social History     Social History   ??? Marital status: MARRIED     Spouse name: N/A   ??? Number of children: N/A   ??? Years of education: N/A     Social History Main Topics   ??? Smoking status: Never Smoker   ??? Smokeless tobacco: Never Used   ??? Alcohol use No   ??? Drug use: No   ??? Sexual activity: Not Asked     Other Topics Concern   ??? None     Social History Narrative       Review of Systems:  Review of Systems   Constitutional: Positive for malaise/fatigue.   HENT: Positive for tinnitus.    Eyes: Positive for blurred vision.   Respiratory: Negative for cough.    Cardiovascular: Negative for chest pain.   Gastrointestinal: Positive for constipation.   Genitourinary: Negative for dysuria.    Musculoskeletal: Positive for neck pain.   Skin: Negative for rash.   Neurological: Positive for dizziness and headaches.   Endo/Heme/Allergies: Does not bruise/bleed easily.   Psychiatric/Behavioral: Negative for depression.         Objective:     Vitals:    03/12/15 0858   Weight: 82.1 kg (181 lb)   Height:  (1.575 m)   PainSc:   0 - No pain       Physical Exam:  Neurologic Exam     Mental Status   Oriented to person, place, and time.     Cranial Nerves   Cranial nerves II through XII intact.     Motor Exam   Muscle bulk: normal  Overall muscle tone: normal    Strength   Strength 5/5 throughout.     Sensory Exam   Light touch normal.   Vibration normal.     Gait, Coordination, and Reflexes     Gait  Gait: normal    Reflexes   Right brachioradialis: 2+  Left brachioradialis: 2+  Right biceps: 2+  Left biceps: 2+  Right triceps: 2+  Left triceps: 2+  Right patellar: 2+  Left patellar: 2+  Right achilles: 2+  Left achilles: 2+  Right plantar: normal  Left plantar: normal  Right Hoffman: absent  Left Hoffman: absent  Right ankle clonus: absent  Left ankle clonus: absent      Normal heart and respiratory sounds    Impression:     44 year old female with low episodic migraine, recent weight  Lower blood pressure today in the clinic/episodes of very high blood pressure previously    Plan:        1. 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

## 2015-03-12 NOTE — Patient Instructions (Signed)
[facility]  Office working hours are Monday - Thursday 9:00 am to 5:00 pm.  Friday 8 am to 12 noon with limited staff.  Office phone number is [phone] and fax is [phone].  For non-emergent medical care and clinical advice during office hours:   1. Call office or   2.  Send message or request using MyChart  For non-emergent medical care and clinical advice after office hours:  1.  Call [phone] or  2.  Send message or request using MyChart  Emergency care can be obtained at the [facility] ER, Urgent Care or calling 911.    Patient Satisfaction Survey  We appreciate you giving us your e-mail address.  Please watch for our patient satisfaction survey which you will receive by e-mail.  We strive to provide you with the best care possible.  We respect all comments and will take comments into consideration to improve our service.  Thank you for your participation.    As a valued patient, you will be receiving a survey from [employer].  We encourage you to share your thoughts and opinions about the care you received today.  Thank you for choosing [facility].    MyChart Activation    Thank you for requesting access to MyChart. Please follow the instructions below to securely access and download your online medical record. MyChart allows you to send messages to your doctor, view your test results, renew your prescriptions, schedule appointments, and more.    How Do I Sign Up?    1. In your internet browser, go to [email or web address]  2. Click on the First Time User? Click Here link in the Sign In box. You will be redirect to the New Member Sign Up page.  3. Enter your MyChart Access Code exactly as it appears below. You will not need to use this code after you???ve completed the sign-up process. If you do not sign up before the expiration date, you must request a new code.    MyChart Access Code: Activation code not generated   Current MyChart Status: Active (This is the date your MyChart access code will expire)    4. Enter the last four digits of your Social Security Number (xxxx) and Date of Birth (mm/dd/yyyy) as indicated and click Submit. You will be taken to the next sign-up page.  5. Create a MyChart ID. This will be your MyChart login ID and cannot be changed, so think of one that is secure and easy to remember.  6. Create a MyChart password. You can change your password at any time.  7. Enter your Password Reset Question and Answer. This can be used at a later time if you forget your password.   8. Enter your e-mail address. You will receive e-mail notification when new information is available in MyChart.  9. Click Sign Up. You can now view and download portions of your medical record.  10. Click the Download Summary menu link to download a portable copy of your medical information.    Additional Information    If you have questions, please visit the Frequently Asked Questions section of the MyChart website at [email or web address] Remember, MyChart is NOT to be used for urgent needs. For medical emergencies, dial 911.

## 2015-03-12 NOTE — Progress Notes (Signed)
[facility]  Brion Aliment, M.D.  [address]  [place], [place]  [address]  Phone:  [phone]  Fax:  [phone]      Patient:  Jacqueline [name] DOB:  December 11, 1970  MRN:  [id number]  SSN:  [id number]    Encounter Date:  03/12/2015    Primary Care Provider:  Donnella Bi, DO  Referring Provider:  No ref. provider found    Chief Complaint   Patient presents with   ??? New Patient     Patient here for a new patient consult for Asthma. States she is on a CPAP machine.        HPI:     Jacqueline [name] is a 44 y.o. female who presents for evaluation of       1. Cough       Patient diagnosed with asthma as a teen- previously on proair. Patient tried Qvar - led to cough and choking and advair. Patient restarted on singulair last year.    No recent asthma exacerbation but recently treated for sinus infection.  Patient currently using proair almost daily. Asthma has been an issue since she moved to [facility] 2014.     She often awakens with cough, choking spells and  instant sinus pressure.    + Fam h.o - Mother - asthma, constant cough   Siblings died at birth.   Cousin - asthma  + Second hand smoke expossure  - parent smoked when younger    Patient with multiple food allergies and was seen by an allergist  a few years ago in [place]. Patient has allergy shots in the past   Triggers: Grass, tree bark ( rash ) and  cat dander     Pet- dog       2. OSA on CPAP    Patient has study due to frequent wake up, non restorative sleep, snoring and witnessed apnea    Patient believes she sleeps more since using CPAP and awakens more rested.      Occasional dry mouth  DME [facility]   Patient using CPAP at least 2 months  Patient didn't wear CPAP while on vacation    ESS 3   [facility]      Patient has h.o GERD - patient weaned of prilosec recently - still notes some reflux      ROS:     No Hemoptysis,   No  Prior TB ,    Yes Pleurisy,   No Pneumothorax,     Broken ribs - No    Yes Pneumonia - past 5 years,  No  DVT,  No PE ,   No  Prior intubation        CONSTITUTIONAL:  Denies fever, chills  EYES:  Denies photophobia, discharge, icterus  ENT:  Denies sore throat,ear pain or rhinorrhea  CARDIOVASCULAR:  Denies chest pain, palpitations or swelling  GI:  Denies abdominal pain, nausea, vomiting, or diarrhea  ENDOCRINE:  Denies polyuria or polydypsia  MUSCULOSKELETAL:  Denies back pain  SKIN:  No rash  NEUROLOGIC:  Denies headache, focal weakness or sensory changes  LYMPHATIC:  Denies swollen glands  History:     Past Medical History   Diagnosis Date   ??? Asthma    ??? Headache    ??? Hypertension    ??? Infertility, female    ??? Nausea & vomiting    ??? Sickle cell trait ([name])    ??? Unspecified adverse effect of anesthesia  Difficult intubation     Past Surgical History   Procedure Laterality Date   ??? Pr breast surgery procedure unlisted       breast reduction   ??? Hx heent       tonsillectomy   ??? Hx other surgical       excision of lipoma, left abd   ??? Hx other surgical  05/29/2013     tennis elbow, tendon release      Pets:  Yes, dog    Social History     Social History   ??? Marital status: MARRIED     Spouse name: Thereasa Distance   ??? Number of children: 0   ??? 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 Smoking status: Never Smoker   ??? Smokeless tobacco: Never Used   ??? Alcohol use No   ??? Drug use: No   ??? Sexual activity: Not Asked     Other Topics Concern   ??? None     Social History Narrative    Patient does drive        Uses: ProAir inh. And Qvar    Uses: CPAP machine w/ nasal mask        DME: [facility]         Family History   Problem Relation Age of Onset   ??? Stroke Mother    ??? Cancer Father    ??? Heart Disease Father    ??? Diabetes Father      Allergies   Allergen Reactions   ??? Peanut Anaphylaxis   ??? Almond Other (comments)   ??? Pcn [Penicillins] Hives   ??? Sesame Oil Other (comments)     Slight allergy   ??? Sulfa (Sulfonamide Antibiotics) Hives    ??? Ultram [Tramadol] Other (comments)     fainted     Current Outpatient Prescriptions   Medication Sig Dispense Refill   ??? topiramate (TOPAMAX) 50 mg tablet Take 1 Tab by mouth nightly. 60 Tab 3   ??? potassium chloride SR (KLOR-CON 10) 10 mEq tablet Take  by mouth. TAKE 2 TABS DAILY     ??? montelukast (SINGULAIR) 10 mg tablet TAKE 1 TAB BY MOUTH DAILY. 90 Tab 1   ??? albuterol (PROAIR HFA) 90 mcg/actuation inhaler Take 2 Puffs by inhalation every six (6) hours as needed for Wheezing. 1 Inhaler 11   ??? desoximetasone (TOPICORT) 0.25 % topical cream Apply  to affected area two (2) times a day. 100 g 0   ??? tiZANidine (ZANAFLEX) 4 mg capsule TAKE ONE CAPSULE BY MOUTH 3 TIMES A DAY 90 Cap 2   ??? lisinopril (PRINIVIL, ZESTRIL) 5 mg tablet Take 1 Tab by mouth daily. 90 Tab 2   ??? polyethylene glycol (MIRALAX) 17 gram/dose powder Take 17 g by mouth daily. Mix in 1 glass of water and drink for constipation 1530 g 0   ??? triamterene-hydrochlorothiazide (MAXZIDE) 75-50 mg per tablet TAKE 1 TABLET BY MOUTH EVERY DAY 30 Tab 5         Physical Exam:     Visit Vitals   ??? BP 132/88 (BP 1 Location: Left arm, BP Patient Position: Sitting)   ??? Pulse 94   ??? Temp 97.9 ??F (36.6 ??C)   ??? Ht  (1.575 m)   ??? Wt 181 lb (82.1 kg)   ??? SpO2 96%   ??? BMI 33.11 kg/m2  room air    Constitutional: Well developed, Well nourished, No acute distress, Non-toxic appearance.   Neurologic: Alert & oriented x 3,  HEENT: Normocephalic, Atraumatic, Bilateral external ears normal, Oropharynx moist, No oral exudates, Nose normal.   Eyes: PERRL, EOMI, Conjunctiva normal, No discharge.   Neck: Normal range of motion, No tenderness, Supple, No stridor.   Cardiovascular: S1S2+ rrr, no mrg   Thorax & Lungs: Vesicular breath sounds, no wheeze or rales    Abdomen: Bowel sounds normal, Soft, No tenderness, No masses, No pulsatile masses.   Skin: Warm, Dry, No erythema, No rash.   Back: No tenderness, No CVA tenderness.    Musculoskeletal:  Normal musculoskeletal exam, No swelling  Extremities: Intact distal pulses, No tenderness, No cyanosis, No clubbing.    Labs:     Lab Results   Component Value Date/Time    WBC 8.9 12/26/2014 03:10 PM    HGB 14.0 12/26/2014 03:10 PM    HCT 40.6 12/26/2014 03:10 PM    PLATELET 324 12/26/2014 03:10 PM    MCV 82.4 12/26/2014 03:10 PM           Rheum     3/ 2016   Scleroderma-70 Ab <0.2  0.0 - 0.9 AI Final   Comment:   (NOTE)        Sjogren   Component Value Flag Ref Range Units Status   Sjogren's Anti-SS-A <0.2  0.0 - 0.9 AI Final   Sjogren's Anti-SS-B <0.2  0.0 - 0.9 AI Final   Comment:     Component Value Flag Ref Range Units Status   CCP Antibodies IgG/IgA 3         Rheumatoid factor 10.5  0.0 - 13.9 IU/mL Final   Comment:   (NOTE)   Performed At: [facility]   [address] [place], [place] [id number]   Annamarie Major PhD [id number]        Component Value Flag Ref Range Units Status   TSH 2.31  0.35 - 3.74 uIU/mL Final   T4, Total 8.6  4.7 - 13.3 ug/dL Final   T3 Uptake 32  31 - 39 % Final   Free thyroxine index 2.8  1.4 - 5.2 ?? Final   Lab and Collection    03/2014   ANA Direct NEGATIVE   NEGATIVE ?? Final   Comment:   (NOTE)   Performed At: [facility]   [address] [place], [place]            Imaging and Other Studies:     CT Chest 8/ 2016      The heart is normal in size. Thoracic aorta is normal in size and enhances  homogeneously. No abnormal hilar, mediastinal, or axillary adenopathy. Central  airway is maintained throughout.  ??  Normal lung volumes without infiltrates or effusions. Pleural surfaces are  unremarkable. Visualized upper abdomen and osseous structures are unremarkable.  ??  IMPRESSION  IMPRESSION: NO ACUTE PROCESS IDENTIFIED.    PFT   None     OCST   5/ 2016     ahi 5.8/ HR   Snoring   Hypoxemia       Cpap  Titration - 8cmH20 - RDI 2.1   Eppileptiform activity noted        ??CBC 7/ 2016   Wbc 8.9   Eos abs 0.2              Assessment:     Encounter Diagnoses  ICD-10-CM ICD-9-CM   1. Moderate persistent asthma without complication J45.40 493.90       Plan:     Orders Placed This Encounter   ??? ALPHA-1-ANTITRYPSIN, TOTAL/PHENOTYPE   ??? ALLERGEN (26) PANEL   ??? IMMUNOGLOBULIN E, QT   ??? REFERRAL TO ALLERGY   ??? PULMONARY FUNCTION TEST     - Trial of Breo 100/66mcg daily   -  Eval for GERD   -  Neurology follow up -- ? Abnormal EEG on sleep study - possible epileptic activity noted in report    RTC 4 weeks   Electronically signed by: Levon Hedger, MD, 03/12/2015 1:08 PM

## 2015-03-20 ENCOUNTER — Inpatient Hospital Stay: Admit: 2015-03-20 | Payer: PRIVATE HEALTH INSURANCE | Attending: Critical Care Medicine | Primary: Family Medicine

## 2015-03-20 ENCOUNTER — Inpatient Hospital Stay: Payer: PRIVATE HEALTH INSURANCE | Attending: Neurology | Primary: Family Medicine

## 2015-03-20 DIAGNOSIS — J4540 Moderate persistent asthma, uncomplicated: Secondary | ICD-10-CM

## 2015-03-20 MED ORDER — SODIUM CHLORIDE 0.9 % NEB SOLUTION
0.9 % | RESPIRATORY_TRACT | Status: DC | PRN
Start: 2015-03-20 — End: 2015-03-24
  Administered 2015-03-20: 14:00:00 via RESPIRATORY_TRACT

## 2015-03-20 MED ORDER — ALBUTEROL SULFATE 2.5 MG/0.5 ML NEB SOLUTION
2.5 mg/0.5 mL | RESPIRATORY_TRACT | Status: AC
Start: 2015-03-20 — End: 2015-03-20
  Administered 2015-03-20: 14:00:00 via RESPIRATORY_TRACT

## 2015-03-20 MED FILL — ALBUTEROL SULFATE 2.5 MG/0.5 ML NEB SOLUTION: 2.5 mg/0.5 mL | RESPIRATORY_TRACT | Qty: 0.5

## 2015-03-20 MED FILL — SODIUM CHLORIDE 0.9 % NEB SOLUTION: 0.9 % | RESPIRATORY_TRACT | Qty: 3

## 2015-03-28 NOTE — Telephone Encounter (Signed)
Pt called to let you know that she ended up going to [facility] ER yesterday.  They told her she had pleurisy and to fu with her pcp.   She just wanted to keep you updated.

## 2015-04-03 ENCOUNTER — Inpatient Hospital Stay: Admit: 2015-04-03 | Payer: PRIVATE HEALTH INSURANCE | Primary: Family Medicine

## 2015-04-03 DIAGNOSIS — J4540 Moderate persistent asthma, uncomplicated: Secondary | ICD-10-CM

## 2015-04-04 LAB — IMMUNOGLOBULIN E, QT: Immunoglobulin E: 137 IU/mL — ABNORMAL HIGH (ref 0–100)

## 2015-04-05 ENCOUNTER — Ambulatory Visit
Admit: 2015-04-05 | Discharge: 2015-04-05 | Payer: PRIVATE HEALTH INSURANCE | Attending: Family Medicine | Primary: Family Medicine

## 2015-04-05 DIAGNOSIS — R9389 Abnormal findings on diagnostic imaging of other specified body structures: Secondary | ICD-10-CM

## 2015-04-05 LAB — ALPHA-1-ANTITRYPSIN, TOTAL, PHENOTYPE
AlpHa-1 Antitrypsin: 149 mg/dL (ref 90–200)
Alpha-1 Antitrypsin: 149 mg/dL (ref 90–200)

## 2015-04-05 MED ORDER — LOSARTAN 25 MG TAB
25 mg | ORAL_TABLET | Freq: Every day | ORAL | 5 refills | Status: DC
Start: 2015-04-05 — End: 2015-05-06

## 2015-04-05 MED ORDER — RANITIDINE 150 MG TAB
150 mg | ORAL_TABLET | Freq: Every evening | ORAL | 2 refills | Status: DC
Start: 2015-04-05 — End: 2015-05-06

## 2015-04-05 NOTE — Progress Notes (Signed)
HISTORY OF PRESENT ILLNESS  Jacqueline [name] is a 44 y.o. female.     has a past medical history of Asthma; Headache; Hypertension; Infertility, female; Nausea & vomiting; Sickle cell trait ([name]); and Unspecified adverse effect of anesthesia.    Chief Complaint   Patient presents with   ??? Follow-up     ER F/U, URI, PT STATES CAT SCAN ABNORMAL AT [facility]       HPI   The patient is a pleasant 44 year old African-American female who presents to the office today to follow up care.  The patient was seen at [facility] where she was having severe chest pain.  The patient states that she has not had any since.  The patient had CAT Scan which revealed possible residual thymic tissue versus other abnormality.  Reviewed the patient's last CAT Scans with her current Pulmonologist.  The patient has opted not to follow up on this.      The patient states that at times chest pain is substernal and can go into her shoulder blade.  There is some associated epigastric bloating.  She also complains of a daily cough and is currently on Lisinopril.  She denies any radiation to arm or anterior jaw.  There is no shortness of breath on exertion.  She has been followed by both Cardiology and Pulmonology.          Cleon Dew, MD on 04/01/2015 9:34 AM   Study Result   SINUS RHYTHM   NORMAL ECG       Component Results   Component Value Ref Range & Units Status   D-DIMER 417 (HH) 151 - 265 ng/mL Final       03/27/2015 2:52 PM - Soft Lab Res In Edi   Component Results   Component Value Ref Range & Units Status   SODIUM 135 135 - 145 mmol/L Final   POTASSIUM 3.5 (L) 3.6 - 5.0 mmol/L Final   CHLORIDE 103 101 - 111 mmol/L Final   CO2 28 21 - 31 mmol/L Final   ANION GAP 4  Final   GLUCOSE 154 (H) 70 - 110 mg/dL Final   BUN 9 6 - 20 mg/dL Final   CREATININE 0.9 0.4 - 1.0 mg/dL Final   CALCIUM 9.4 8.5 - 10.5 mg/dL Final   OSMOLALITY 098 266 - 309 Final   B/C 10 10 - 20 Final   ESTIMATED GFR 68 mL/min Final      03/27/2015 2:33 PM - Soft Lab Res In Edi   Component Results   Component Value Ref Range & Units Status   WBC 5.1 3.4 - 11.3 10*3/uL Final   RBC 4.42 3.65 - 5.16 10*6/uL Final   HGB 12.1 11.1 - 15.4 g/dL Final   HCT 11.9 14.7 - 45.5 % Final   MCV 84.5 81.0 - 98.2 fL Final   MCH 27.4 27.0 - 31.1 pg Final   MCHC 32.4 (L) 32.6 - 34.9 g/dL Final   RDW 82.9 (H) 56.2 - 14.5 % Final   MPV 7.5 6.9 - 9.9 fL Final   Platelet Cnt 269 146 - 374 10*3/uL Final   Neutrophils 56.2 48.8 - 75.9 % Final   Lymphocytes 33.7 16.3 - 43.9 % Final   Monocytes 9.3 2.1 - 13.3 % Final   Eosinophils 0.5 0.3 - 5.0 % Final   Basophils 0.3 0.0 - 1.1 % Final   Neutrophils Abs 2.9 1.6 - 8.5 10*3/uL Final   Lymphocytes Abs 1.7 0.6 -  4.9 10*3/uL Final   Monocytes Abs 0.5 0.0 - 1.4 10*3/uL Final   Eosinophils Abs 0.0 0.0 - 0.5 10*3/uL Final   Basophils Abs 0.0 0.0 - 0.1 10*3/uL Final       PATIENT NAME: Jacqueline [name], Jacqueline [name] MR#: [id number]   PROCEDURE DATE: 03/27/2015 ACCOUNT#: [id number]   ACCESSION #: [id number] ROOM#: [id number]   ORDERING PHYS: Cleon Dew, MD   Comparison: None   INDICATION: Back pain with inspiration. D-dimer 417. History of   hypertension and asthma.   TECHNIQUE: Multidetector CT of the chest was performed during high rate   injection of intravenous contrast. Multiplanar reconstructions were obtained   from the initial axially acquired data set. Computer aided detection was   utilized. Dose reduction techniques were utilized.   D L P 520.6 mGy/cm.   Findings:   There is normal enhancement of the visualized central and peripheral   pulmonary arteries with no filling defects seen to suggest presence of   pulmonary emboli. The heart is normal in size. There is no pericardial   effusion. There is increased soft tissue opacity in the prevascular space   the margins of the anterior mediastinum however have a normal contour with no   abnormal convexity. Would favor residual thymic tissue over anterior    mediastinal lymphadenopathy or mass. No other significant mediastinal or   hilar lymphadenopathy is seen. There is no axillary lymphadenopathy seen.   The thoracic aorta is normal in course and caliber without evidence of   aneurysm. There is incidental bovine arch configuration   The lung parenchyma is normal appearance. The tracheobronchial tree   visualized airways are normal appearance. No pleural effusions are   identified.   Limited images of the upper abdomen demonstrates some soft tissue stranding   over the left anterior lateral abdominal wall which is nonspecific and may   represent a left anterior abdominal wall muscle injury or contusion if there   is history of recent trauma. Please correlate clinically. This is   asymmetric as compared to the right.   Bone windows demonstrate no significant osseous abnormalities.   Impression:   1. No evidence of pulmonary emboli, aortic aneurysm, or dissection.   2. Increased soft tissue opacity in the prevascular space for which residual   thymic tissue is favored over anterior mediastinal lymphadenopathy or mass.   Would recommend follow-up in the next six to eight weeks.   3. Soft tissue stranding overlying the left anterior lateral abdominal wall.   This could be related to a focal muscular injury or contusion.             02/05/15  Study Result   CT CHEST W CONT  ??  INDICATION: Abnormal echo finding, chest pain and pressure.  ??  COMPARISON: No previous available.  ??  CONTRAST: 70 mL Isovue-370.  ??  FINDINGS:  ??  The heart is normal in size. Thoracic aorta is normal in size and enhances  homogeneously. No abnormal hilar, mediastinal, or axillary adenopathy. Central  airway is maintained throughout.  ??  Normal lung volumes without infiltrates or effusions. Pleural surfaces are  unremarkable. Visualized upper abdomen and osseous structures are unremarkable.  ??  IMPRESSION  IMPRESSION: NO ACUTE PROCESS IDENTIFIED.  ??     PATIENT NAME: Jacqueline [name], Jacqueline [name] MR#: [id number]    PROCEDURE DATE: 03/27/2015 ACCOUNT#: [id number]   ACCESSION #: [id number] ROOM#: [id number]   ORDERING PHYS: Cleon Dew, MD   COMPARISON: None   INDICATION:  Left lower extremity pain edema   FINDINGS: Wallace Cullens scale as well as color Doppler and Doppler waveform   evaluation of the left lower extremity venous system was performed from the   common femoral vein through the upper calf. There was normal compression,   augmentation and color flow identified within the deep veins and greater   saphenous vein without evidence of DVT.   IMPRESSION:   1. Normal left lower extremity venous duplex.     ROS  A thorough 10 point review of systems was done and was unremarkable except for HPI and chronic medical conditions.     Past Medical History   Diagnosis Date   ??? Asthma    ??? Headache    ??? Hypertension    ??? Infertility, female    ??? Nausea & vomiting    ??? Sickle cell trait ([name])    ??? Unspecified adverse effect of anesthesia      Difficult intubation     Past Surgical History   Procedure Laterality Date   ??? Pr breast surgery procedure unlisted       breast reduction   ??? Hx heent       tonsillectomy   ??? Hx other surgical       excision of lipoma, left abd   ??? Hx other surgical  05/29/2013     tennis elbow, tendon release      Family History   Problem Relation Age of Onset   ??? Stroke Mother    ??? Cancer Father    ??? Heart Disease Father    ??? Diabetes Father      Social History     Social History   ??? Marital status: MARRIED     Spouse name: Thereasa Distance   ??? Number of children: 0   ??? 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 Smoking status: Never Smoker   ??? Smokeless tobacco: Never Used   ??? Alcohol use No   ??? Drug use: No   ??? Sexual activity: Not on file     Other Topics Concern   ??? Not on file     Social History Narrative    Patient does drive        Uses: ProAir inh. And Qvar    Uses: CPAP machine w/ nasal mask        DME: [facility]         Allergies   Allergen Reactions    ??? Peanut Anaphylaxis   ??? Almond Other (comments)   ??? Pcn [Penicillins] Hives   ??? Sesame Oil Other (comments)     Slight allergy   ??? Sulfa (Sulfonamide Antibiotics) Hives   ??? Ultram [Tramadol] Other (comments)     fainted     Current Outpatient Prescriptions on File Prior to Visit   Medication Sig Dispense Refill   ??? topiramate (TOPAMAX) 50 mg tablet Take 1 Tab by mouth nightly. 60 Tab 3   ??? potassium chloride SR (KLOR-CON 10) 10 mEq tablet Take  by mouth. TAKE 2 TABS DAILY     ??? montelukast (SINGULAIR) 10 mg tablet TAKE 1 TAB BY MOUTH DAILY. 90 Tab 1   ??? albuterol (PROAIR HFA) 90 mcg/actuation inhaler Take 2 Puffs by inhalation every six (6) hours as needed for Wheezing. 1 Inhaler 11   ??? desoximetasone (TOPICORT) 0.25 % topical cream Apply  to affected  area two (2) times a day. 100 g 0   ??? tiZANidine (ZANAFLEX) 4 mg capsule TAKE ONE CAPSULE BY MOUTH 3 TIMES A DAY 90 Cap 2   ??? polyethylene glycol (MIRALAX) 17 gram/dose powder Take 17 g by mouth daily. Mix in 1 glass of water and drink for constipation 1530 g 0   ??? triamterene-hydrochlorothiazide (MAXZIDE) 75-50 mg per tablet TAKE 1 TABLET BY MOUTH EVERY DAY 30 Tab 5     No current facility-administered medications on file prior to visit.        Vitals:    04/05/15 1253   BP: 124/80   Pulse: 88   Resp: 18   Temp: 98 ??F (36.7 ??C)   TempSrc: Temporal   SpO2: 98%   Weight: 180 lb (81.6 kg)   Height: 5\' 2"  (1.575 m)   .  Body mass index is 32.92 kg/(m^2).        Physical Exam    General: NAD. A x O x 3     Skin: no evidence of rashes, scars, or moles    HEENT: Head: atraumatic normocephalic, Eyes:  PERLA, EOMI Ears: without discharge or pain of external exam Nares: patent Throat: with out erythema     Neck: no thyromegaly    Heart:  RRR, S1S2, no murmur, gallop, or rub    Lungs:   CTA bilaterally     Abdomen:  Soft, nontender, BS x 4    Extremities:  Strength 4/4, no cyanosis, no edema    ASSESSMENT and PLAN\\    ICD-10-CM ICD-9-CM     1. Abnormal CT scan, chest R93.8 793.2 CANCELED: CT CHEST W CONT   2. Gastroesophageal reflux disease without esophagitis K21.9 530.81 ranitidine (ZANTAC) 150 mg tablet   3. Atypical chest pain R07.89 786.59 US ABD LTD      CANCELED: CT CHEST W CONT   4. Cough due to ACE inhibitor R05 786.2 losartan (COZAAR) 25 mg tablet    T46.4X5A E942.6      Orders Placed This Encounter   ??? US ABD LTD   ??? cyclobenzaprine (FLEXERIL) 10 mg tablet   ??? ranitidine (ZANTAC) 150 mg tablet   ??? losartan (COZAAR) 25 mg tablet     Follow-up Disposition: 4 months   lab results and schedule of future lab studies reviewed with patient  reviewed medications and side effects in detail  radiology results and schedule of future radiology studies reviewed with patient    GO TO ED OR CALL 911 if pt  experiences ANY OF THE FOLLOWING:  chest pain, SOB., jaw/neck pain, N/V, diaphoresis, or arm pain     Patient advised if symptoms worsen, if she begins having any signs of respiratory distress.  Call 911/ GO TO ED IMMEDIATELY!      I have discussed the above plan with the patient. Patient has also been given instructions and information on her conditions.  Patient is aware of all possible side effects and agrees with the above mentioned plan.       Donnella BiLauren E Elan Brainerd, DO

## 2015-04-05 NOTE — Patient Instructions (Signed)
[facility]  Office working hours are Monday - Friday 8:00AM-5:00PM.  Office phone number is [phone] and fax is [phone].  For non-emergent medical care and clinical advice during office hours:   1. Call office or   2.  Send message or request using MyChart  For non-emergent medical care and clinical advice after office hours:  1.  Send message or request using MyChart   2.  Call [phone] and leave a message with our answering service or    Emergency care can be obtained at the [facility] ER, Urgent Care or by calling 911.    Patient Satisfaction Survey  As a valued patient, you will be receiving a survey from [employer].  We encourage you to share your thoughts and opinions about the care you received today. Thank you for choosing [facility]           BRING ALL MEDICATION BOTTLES TO EVERY APPOINTMENT    Medication Refills -   * Please do not let your medication run out before calling for a refill, the best practice would be to call when you have 2 weeks of medication left.  * Please allow 48 hours for medications to be called into your pharmacy when you request a medication refill      Have MyChart? Use it to request medication refills, it's quick and easy! If you have questions about MyChart please feel free to ask our staff!

## 2015-04-06 LAB — ALLERGEN (26) PANEL
Alternaria tenuis: 0.73 kU/L — AB
American White Elm: 0.31 kU/L — AB
Aspergillus fumigatus: 0.94 kU/L — AB
Bermuda Grass: 0.12 kU/L — AB
Bluegrass, ~~LOC~~: 0.53 kU/L — AB
Cat Hair/Dander: 0.28 kU/L — AB
Cladosporium herbarum: 0.49 kU/L — AB
Cottonwood Tree: 0.19 kU/L — AB
Cow hair and dander: <0.1 kU/L
D. farinae Mite: <0.1 kU/L
D. pteronyssinus: <0.1 kU/L
Dog Hair/Dander: 0.11 kU/L — AB
Horse Epithelia/Dander: <0.1 kU/L
House Dust (Greer Lab): <0.1 kU/L
Immunoglobulin E: 136 IU/mL — ABNORMAL HIGH (ref 0–100)
Johnson Grass: 0.11 kU/L — AB
Lamb's Quarter: <0.1 kU/L
Mesquite: <0.1 kU/L
Mountain Cedar: 0.14 kU/L — AB
Mugwort: <0.1 kU/L
Olive Tree: 0.15 kU/L — AB
Penicillium notatum: 0.11 kU/L — AB
Perennial Rye: 0.29 kU/L — AB
Plantain, English: 0.24 kU/L — AB
Ragweed, Short/Common: 0.13 kU/L — AB
Thistle, Russian: 0.2 kU/L — AB
White Oak: 0.18 kU/L — AB

## 2015-04-16 ENCOUNTER — Encounter

## 2015-04-16 ENCOUNTER — Ambulatory Visit
Admit: 2015-04-16 | Discharge: 2015-04-16 | Payer: PRIVATE HEALTH INSURANCE | Attending: Family | Primary: Family Medicine

## 2015-04-16 DIAGNOSIS — R002 Palpitations: Secondary | ICD-10-CM

## 2015-04-16 NOTE — Progress Notes (Signed)
Patient: Jacqueline [name]               Sex: female          Enc Date:  04/16/2015        Date of Birth:  11-11-1970      Age:  44 y.o.           HPI:     Jacqueline [name] is a 44 y.o. female who presents with follow up of palpitations.  She has been in zantac which helped her reflux.  She feels occasional fluttering with activity and stress related palpitations.      Chief Complaint   Patient presents with   ??? Irregular Heart Beat     palpitations, 3 month follow up.   ??? Hypertension   ??? Chest Pain     hx of cp     Hypertension  Diet and Lifestyle: generally follows a low fat low cholesterol diet, generally follows a low sodium diet, exercises sporadically, nonsmoker, caffeine intake some  Home BP Monitoring: is not measured at home.  Pertinent ROS: taking medications as instructed, no medication side effects noted, no TIA's, no chest pain on exertion, no dyspnea on exertion, no swelling of ankles.       Vitals:    04/16/15 1433 04/16/15 1601   BP: (!) 127/92 120/78   Pulse: 89    SpO2: 100%    Weight: 177 lb 4.8 oz (80.4 kg)      Past Medical History   Diagnosis Date   ??? Asthma    ??? Headache    ??? Hypertension    ??? Infertility, female    ??? Nausea & vomiting    ??? Sickle cell trait ([name])    ??? Unspecified adverse effect of anesthesia      Difficult intubation       Past Surgical History   Procedure Laterality Date   ??? Pr breast surgery procedure unlisted       breast reduction   ??? Hx heent       tonsillectomy   ??? Hx other surgical       excision of lipoma, left abd   ??? Hx other surgical  05/29/2013     tennis elbow, tendon release        Family History   Problem Relation Age of Onset   ??? Stroke Mother    ??? Cancer Father    ??? Heart Disease Father    ??? Diabetes Father        Social History     Social History   ??? Marital status: MARRIED     Spouse name: Thereasa Distance   ??? Number of children: 0   ??? 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 Smoking status: Never Smoker   ??? Smokeless tobacco: Never Used   ??? Alcohol use No   ??? Drug use: No   ??? Sexual activity: Not Asked     Other Topics Concern   ??? None     Social History Narrative    Patient does drive        Uses: ProAir inh. And Qvar    Uses: CPAP machine w/ nasal mask        DME: [facility]           Current Outpatient Prescriptions   Medication Sig Dispense Refill   ???  cyclobenzaprine (FLEXERIL) 10 mg tablet Take 10 mg by mouth.     ??? ranitidine (ZANTAC) 150 mg tablet Take 2 Tabs by mouth nightly. 60 Tab 2   ??? losartan (COZAAR) 25 mg tablet Take 1 Tab by mouth daily. For blood pressure and renal protection 30 Tab 5   ??? topiramate (TOPAMAX) 50 mg tablet Take 1 Tab by mouth nightly. 60 Tab 3   ??? potassium chloride SR (KLOR-CON 10) 10 mEq tablet Take  by mouth. TAKE 2 TABS DAILY     ??? montelukast (SINGULAIR) 10 mg tablet TAKE 1 TAB BY MOUTH DAILY. 90 Tab 1   ??? albuterol (PROAIR HFA) 90 mcg/actuation inhaler Take 2 Puffs by inhalation every six (6) hours as needed for Wheezing. 1 Inhaler 11   ??? desoximetasone (TOPICORT) 0.25 % topical cream Apply  to affected area two (2) times a day. 100 g 0   ??? tiZANidine (ZANAFLEX) 4 mg capsule TAKE ONE CAPSULE BY MOUTH 3 TIMES A DAY 90 Cap 2   ??? polyethylene glycol (MIRALAX) 17 gram/dose powder Take 17 g by mouth daily. Mix in 1 glass of water and drink for constipation 1530 g 0   ??? triamterene-hydrochlorothiazide (MAXZIDE) 75-50 mg per tablet TAKE 1 TABLET BY MOUTH EVERY DAY 30 Tab 5        Allergies   Allergen Reactions   ??? Peanut Anaphylaxis   ??? Almond Other (comments)   ??? Lisinopril Cough   ??? Pcn [Penicillins] Hives   ??? Sesame Oil Other (comments)     Slight allergy   ??? Sulfa (Sulfonamide Antibiotics) Hives   ??? Ultram [Tramadol] Other (comments)     fainted       Review of Systems  Review of Systems   Constitutional: Negative.    Eyes: Negative.    Respiratory:        She has persistent asthma, doing well on Singulair and Breo daily and albuterol inhaler as needed    Cardiovascular: Negative.         Occasional fluttering when she does to much and possibly during stress. No fluttering during sex   Gastrointestinal: Positive for heartburn.        Zantac relieved her acid reflux   Genitourinary: Negative.    Musculoskeletal: Negative.    Skin: Negative.    Psychiatric/Behavioral: Negative.           Physical Exam:      Vitals:    04/16/15 1601   BP: 120/78   Pulse:    SpO2:    Weight:       Body mass index is 32.43 kg/(m^2).    Physical Exam   Constitutional: She is oriented to person, place, and time. She appears well-developed and well-nourished.   Cardiovascular: Normal rate and regular rhythm.    No murmur heard.  Pulmonary/Chest: Effort normal and breath sounds normal. No respiratory distress. She has no wheezes. She has no rales.   Abdominal: Soft. Bowel sounds are normal.   Musculoskeletal: Normal range of motion. She exhibits edema.   Neurological: She is alert and oriented to person, place, and time.   Skin: Skin is warm and dry.   Nursing note and vitals reviewed.        Labs Reviewed:  Hospital Outpatient Visit on 04/03/2015   Component Date Value Ref Range Status   ??? Alpha-1 Antitrypsin 04/03/2015 149  90 - 200 mg/dL Final    Comment: (NOTE)  Performed At: [facility]  [phone]  [address] [place], [place] [id number]  Annamarie Major PhD WU:[id number]     ??? Phenotype (PI) 04/03/2015 MM   Final    Comment: (NOTE)        Phenotype   Population      A-1-AT Concentration                    Incidence %      Reference Interval        MM            86.5%                96 - 189        MS             8.0%                83 - 161        MZ             3.9%                60 - 111        FM             0.4%                93 - 191        SZ             0.3%                42 -  75        SS             0.1%                62 - 119        ZZ             0.05%               16 -  38        FS             0.05%               70 - 128        FZ            Unknown              44 -  88         FF            Unknown              Unknown  Performed At: [facility]  [address] [place], [place] [id number]  Mila Homer MD VH:[id number]     ??? Immunoglobulin E 04/03/2015 136* 0 - 100 IU/mL Final   ??? D. pteronyssinus 04/03/2015 <0.10  Class 0 kU/L Final   ??? D. farinae Mite 04/03/2015 <0.10  Class 0 kU/L Final   ??? Cat Hair/Dander 04/03/2015 0.28* Class 0/I kU/L Final   ??? Horse Epithelia/Dander 04/03/2015 <0.10  Class 0 kU/L Final   ??? Cow hair and dander 04/03/2015 <0.10  Class 0 kU/L Final   ??? Dog Hair/Dander 04/03/2015 0.11* Class 0/I kU/L Final   ??? [place] Grass 04/03/2015 0.12* Class 0/I kU/L Final   ??? Perennial Rye 04/03/2015 0.29* Class 0/I kU/L Final   ??? [place], [place] 04/03/2015 0.53* Class I kU/L Final   ??? Corlis Leak  04/03/2015 0.11* Class 0/I kU/L Final   ??? House Dust [employer]) 04/03/2015 <0.10  Class 0 kU/L Final   ??? Penicillium notatum 04/03/2015 0.11* Class 0/I kU/L Final   ??? Cladosporium herbarum 04/03/2015 0.49* Class I kU/L Final   ??? Aspergillus fumigatus 04/03/2015 0.94* Class II kU/L Final   ??? Alternaria tenuis 04/03/2015 0.73* Class II kU/L Final   ??? [facility] 04/03/2015 0.14* Class 0/I kU/L Final   ??? White [place] 04/03/2015 0.18* Class 0/I kU/L Final   ??? American White [place]m 04/03/2015 0.31* Class 0/I kU/L Final   ??? Olive Tree 04/03/2015 0.15* Class 0/I kU/L Final   ??? [place] Tree 04/03/2015 0.19* Class 0/I kU/L Final   ??? Mesquite 04/03/2015 <0.10  Class 0 kU/L Final   ??? Ragweed, Short/Common 04/03/2015 0.13* Class 0/I kU/L Final   ??? Mugwort 04/03/2015 <0.10  Class 0 kU/L Final   ??? Plantain, English 04/03/2015 0.24* Class 0/I kU/L Final   ??? Lamb's Quarter 04/03/2015 <0.10  Class 0 kU/L Final   ??? Thistle, [place] 04/03/2015 0.20* Class 0/I kU/L Final    Comment: (NOTE)  Performed At: [facility] [place]  [address] [place], [place] [id number]  Mila HomerHancock William F MD WU:[id number]:[phone]  Performed At: [facility] [place]  [address] [place], [place] [id number]   Annamarie MajorRicchiuti Vincent PhD VH:[phone]     ??? Class description 04/03/2015 Comment    Final    Comment: (NOTE)     Levels of Specific IgE       Class  Description of Class     ---------------------------  -----  --------------------                    < 0.10         0         Negative            0.10 -    0.31         0/I       Equivocal/Low            0.32 -    0.55         I         Low            0.56 -    1.40         II        Moderate            1.41 -    3.90         III       High            3.91 -   19.00         IV        Very High           19.01 -  100.00         V         Very High                   >100.00         VI        Very High     ??? Immunoglobulin E 04/03/2015 137* 0 - 100 IU/mL Final    Comment: (NOTE)  Performed At: [facility]  [address] [place], [place] [id number]  Annamarie Majoricchiuti Vincent PhD [phone]     Hospital Outpatient Visit on 02/01/2015  Component Date Value Ref Range Status   ??? TSH 02/01/2015 2.47  0.35 - 3.74 uIU/mL Final   ??? T4, Total 02/01/2015 10.4  4.7 - 13.3 ug/dL Final   ??? T3 Uptake 16/03/9603 34  31 - 39 % Final   ??? Free thyroxine index 02/01/2015 3.5  1.4 - 5.2   Final   ??? Magnesium 02/01/2015 2.0  1.8 - 2.4 mg/dL Final   ??? Vitamin D [id number] 02/01/2015 36.3  30 - 80 ng/mL Final    Comment: (NOTE)  Deficiency               <20 ng/mL  Insufficiency          20-30 ng/mL  Sufficient            30-100 ng/mL  Possible toxicity       >100 ng/mL    The Method used is Siemens Advia Centaur currently standardized to a   Center of Disease Control and Prevention (CDC) certified reference   method. Samples containing fluorescein dye can produce falsely   elevated values when tested with the ADVIA Centaur Vitamin D Assay.   It is recommended that results in the toxic range, >100 ng/mL, be   retested 72 hours post fluorescein exposure.         Imaging and Procedure:  No results found for this or any previous visit.    Cardiac Studies  Last Stress Test      ????   NUCLEAR MEDICINE MYOCARDIAL PERFUSION SCAN:?? ??  ????  REASON FOR STUDY:???? Acute chest pain  ????  PROCEDURE:???? After informed consent the patient had treadmill stress test done. ??  The patient exercised for 4.00 minutes into Bruce protocol achieving maximum  heart rate of 165 BPM that is more than [85%] of MPHR.??? At the peak heart rate  the patient was given 30.3 mCi of Myoview.???? The patient had rest imaging done  with 11.22 mCi of Myoview.???? Nuclear images were obtained as per protocol.  ????  REPORT:?? ??  ????  Review of rest and stress images reveal normal homogeneous uptake noted in both  rest and stress images..?? No definite reversibility is seen.?? ??  ????  Review of gated SPECT images reveal preserved LV systolic function with  calculated EF 77%. ??  ????  IMPRESSION: ??  ????  1.?? NORMAL MYOCARDIAL PERFUSION SCAN AT LOW TO MODERATE LEVEL OF EXERCISE AND  MORE THAN 85% OF MAXIMUM PREDICTED HEART RATE. NO EVIDENCE OF REVERSIBLE  ISCHEMIA OR PRIOR INFARCT NOTED.  ????  2.?? PRESERVED LV SYSTOLIC FUNCTION WITH CALCULATED EF OF 77%   ??  Event Monitor  Sinus rhythm on baseline   sinus rhythm on all events    Last Echo   EF 65-70%  Mild MR  Trace TR    Last ECG results 12/2014  Sinus  Rhythm   Low voltage in precordial leads.     ABNORMAL       Assessment/Plan     Patient Active Problem List   Diagnosis Code   ??? Chest pain R07.9   ??? Heart palpitations R00.2   ??? Peripheral edema R60.9   ??? Muscle spasms of lower extremity M62.838   ??? Sickle cell trait ([name]) D57.3   ??? HTN (hypertension) I10   ??? Asthma J45.909   ??? OSA on CPAP G47.33          ICD-10-CM ICD-9-CM    1. Heart palpitations R00.2 785.1      Encounter  Diagnoses   Name Primary?   ??? Heart palpitations Yes     No orders of the defined types were placed in this encounter.    Jacqueline [name] was seen today for irregular heart beat, hypertension and chest pain.    Diagnoses and all orders for this visit:    Heart palpitations      Follow-up Disposition:   Return in about 1 year (around 04/15/2016), or if symptoms worsen or fail to improve, for hypertension, fluttering.  current treatment plan is effective, no change in therapy  She is relieved with results of normal stress test and normal heart function. She does not want to treat occasional fluttering since heart function is normal and stress test is normal. States she is feeling better as well and thinks this was stress related  reviewed diet, exercise and weight control. She states these are not bothersome to her.  We discussed regular exercise which she has been wanting to increase exercise of weight control.  cardiovascular risk and specific lipid/LDL goals reviewed  reviewed medications and side effects in detail      Rhys Martini, APRN 04/16/2015 3:30 PM

## 2015-04-16 NOTE — Patient Instructions (Signed)
MyChart Activation    Thank you for requesting access to MyChart. Please follow the instructions below to securely access and download your online medical record. MyChart allows you to send messages to your doctor, view your test results, renew your prescriptions, schedule appointments, and more.    How Do I Sign Up?    1. In your internet browser, go to [email or web address]  2. Click on the First Time User? Click Here link in the Sign In box. You will be redirect to the New Member Sign Up page.  3. Enter your MyChart Access Code exactly as it appears below. You will not need to use this code after you???ve completed the sign-up process. If you do not sign up before the expiration date, you must request a new code.    MyChart Access Code: Activation code not generated  Current MyChart Status: Active (This is the date your MyChart access code will expire)    4. Enter the last four digits of your Social Security Number (xxxx) and Date of Birth (mm/dd/yyyy) as indicated and click Submit. You will be taken to the next sign-up page.  5. Create a MyChart ID. This will be your MyChart login ID and cannot be changed, so think of one that is secure and easy to remember.  6. Create a MyChart password. You can change your password at any time.  7. Enter your Password Reset Question and Answer. This can be used at a later time if you forget your password.   8. Enter your e-mail address. You will receive e-mail notification when new information is available in MyChart.  9. Click Sign Up. You can now view and download portions of your medical record.  10. Click the Download Summary menu link to download a portable copy of your medical information.    Additional Information    If you have questions, please visit the Frequently Asked Questions section of the MyChart website at [email or web address] Remember, MyChart is NOT to be used for urgent needs. For medical emergencies, dial 911.

## 2015-04-17 ENCOUNTER — Inpatient Hospital Stay: Admit: 2015-04-17 | Payer: PRIVATE HEALTH INSURANCE | Attending: Family Medicine | Primary: Family Medicine

## 2015-04-17 ENCOUNTER — Encounter: Attending: Interventional Cardiology | Primary: Family Medicine

## 2015-04-17 DIAGNOSIS — R0789 Other chest pain: Secondary | ICD-10-CM

## 2015-04-17 NOTE — Progress Notes (Signed)
Done.

## 2015-04-17 NOTE — Progress Notes (Signed)
US GB normal  Donnella BiLauren E Charlotta Lapaglia, DO

## 2015-04-18 ENCOUNTER — Ambulatory Visit
Admit: 2015-04-18 | Discharge: 2015-04-18 | Payer: PRIVATE HEALTH INSURANCE | Attending: Critical Care Medicine | Primary: Family Medicine

## 2015-04-18 DIAGNOSIS — J4530 Mild persistent asthma, uncomplicated: Secondary | ICD-10-CM

## 2015-04-18 MED ORDER — FLUTICASONE 100 MCG-VILANTEROL 25 MCG/DOSE BREATH ACTIVATED INHALER
100-25 mcg/dose | Freq: Every day | RESPIRATORY_TRACT | 6 refills | Status: AC
Start: 2015-04-18 — End: ?

## 2015-04-18 NOTE — Progress Notes (Signed)
[facility]  Brion AlimentSabrina Bode Pieper, M.D.  [address]  [place], [place]  [id number]  Phone:  [phone]  Fax:  [phone]      Patient Name:  Jacqueline LothLakesha Shonta [name]  Date of Birth:  28-Jan-1971    Office Visit 04/18/2015      FOLLOW UP    CHIEF COMPLAINT:    Chief Complaint   Patient presents with   ??? Follow-up     Patient here for a 5 week fu appt. states she is feeling better. No new concerns.   ??? Asthma       HISTORY OF PRESENT ILLNESS:      Patient seen in ER for sudden pleuritic pain lasted 2 weeks - had CT and duplex done at the time. Patient treated with NSAID. Patient tried Virgel BouquetBreo was helpful. Patient seen by Dr. Andrey CampanileWilson - also [facility] Ell[facility].     Patient had allergy testing - started on shots weekly. She continues with singulair.     Feels much better     Results reviewed    Due to see neuro in Dec    OSA on CPAP  Continues to use wo issue   Download reviewed    Due for EEG       REVIEW OF SYSTEMS:   CONSTITUTIONAL:    There is no history of fever, chills, night sweats, weight loss, weight gain, persistent fatigue, or lethargy/hypersomnolence.   CARDIAC:   No chest pain, pressure, discomfort, palpitations, orthopnea, murmurs, or edema.    Past Medical History   Diagnosis Date   ??? Allergic    ??? Asthma    ??? Headache    ??? Hypertension    ??? Infertility, female    ??? Nausea & vomiting    ??? Sickle cell trait ([name])    ??? Unspecified adverse effect of anesthesia      Difficult intubation         Problem List  Date Reviewed: 04/16/2015          Codes Class Noted    OSA on CPAP ICD-10-CM: G47.33  ICD-9-CM: 327.23  03/15/2015        Chest pain ICD-10-CM: R07.9  ICD-9-CM: 786.50  09/06/2012        Heart palpitations ICD-10-CM: R00.2  ICD-9-CM: 785.1  09/06/2012        Peripheral edema ICD-10-CM: R60.9  ICD-9-CM: 782.3  09/06/2012        Muscle spasms of lower extremity ICD-10-CM: V78.46962.838  ICD-9-CM: 728.85  09/06/2012        Sickle cell trait ([name]) ICD-10-CM: D57.3  ICD-9-CM: 282.5  09/06/2012         HTN (hypertension) ICD-10-CM: I10  ICD-9-CM: 401.9  09/06/2012        Asthma ICD-10-CM: J45.909  ICD-9-CM: 493.90  09/06/2012                Past Surgical History   Procedure Laterality Date   ??? Pr breast surgery procedure unlisted       breast reduction   ??? Hx heent       tonsillectomy   ??? Hx other surgical       excision of lipoma, left abd   ??? Hx other surgical  05/29/2013     tennis elbow, tendon release        No flowsheet data found.            Social History     Social History   ???  Marital status: MARRIED     Spouse name: Jacqueline [name]   ??? Number of children: 0   ??? 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 Smoking status: Never Smoker   ??? Smokeless tobacco: Never Used   ??? Alcohol use No   ??? Drug use: No   ??? Sexual activity: Not on file     Other Topics Concern   ??? Not on file     Social History Narrative    Patient does drive        Uses: ProAir inh. And Breo    Uses: CPAP machine w/ nasal mask        DME: [facility]             Family History   Problem Relation Age of Onset   ??? Stroke Mother    ??? Cancer Father    ??? Heart Disease Father    ??? Diabetes Father          Allergies   Allergen Reactions   ??? Peanut Anaphylaxis   ??? Almond Other (comments)   ??? Lisinopril Cough   ??? Pcn [Penicillins] Hives   ??? Sesame Oil Other (comments)     Slight allergy   ??? Sulfa (Sulfonamide Antibiotics) Hives   ??? Ultram [Tramadol] Other (comments)     fainted         Current Outpatient Prescriptions   Medication Sig   ??? fluticasone-vilanterol (BREO ELLIPTA) 100-25 mcg/dose inhaler Take 1 Puff by inhalation daily.   ??? fluticasone-vilanterol (BREO ELLIPTA) 100-25 mcg/dose inhaler Take 1 Puff by inhalation daily.   ??? cyclobenzaprine (FLEXERIL) 10 mg tablet Take 10 mg by mouth.   ??? ranitidine (ZANTAC) 150 mg tablet Take 2 Tabs by mouth nightly.   ??? losartan (COZAAR) 25 mg tablet Take 1 Tab by mouth daily. For blood pressure and renal protection    ??? topiramate (TOPAMAX) 50 mg tablet Take 1 Tab by mouth nightly.   ??? potassium chloride SR (KLOR-CON 10) 10 mEq tablet Take  by mouth. TAKE 2 TABS DAILY   ??? montelukast (SINGULAIR) 10 mg tablet TAKE 1 TAB BY MOUTH DAILY.   ??? albuterol (PROAIR HFA) 90 mcg/actuation inhaler Take 2 Puffs by inhalation every six (6) hours as needed for Wheezing.   ??? desoximetasone (TOPICORT) 0.25 % topical cream Apply  to affected area two (2) times a day.   ??? tiZANidine (ZANAFLEX) 4 mg capsule TAKE ONE CAPSULE BY MOUTH 3 TIMES A DAY   ??? polyethylene glycol (MIRALAX) 17 gram/dose powder Take 17 g by mouth daily. Mix in 1 glass of water and drink for constipation   ??? triamterene-hydrochlorothiazide (MAXZIDE) 75-50 mg per tablet TAKE 1 TABLET BY MOUTH EVERY DAY     No current facility-administered medications for this visit.         PHYSICAL EXAM:    Vitals:    04/18/15 1035   BP: 124/76   BP 1 Location: Left arm   BP Patient Position: Sitting   Pulse: 90   Temp: 98.4 ??F (36.9 ??C)   SpO2: 97%   Weight: 178 lb (80.7 kg)   Height: 5\' 2"  (1.575 m)          GENERAL APPEARANCE:   The patient is normal weight and in no respiratory distress.   HEENT:   PERRL.  Conjunctivae unremarkable.    LUNGS:  Normal respiratory effort with symmetrical lung expansion.   Breath sounds vesicular, no wheeze or rales .   HEART:   There is a regular rate and rhythm.  No murmur, rub, or gallop.  There is no edema in lower extremities.    NEURO:   The patient is alert and oriented to person, place, and time.  Memory appears intact and mood is normal.  No gross sensorimotor deficits are present.      DIAGNOSTIC TESTS:    CPAP download  03/18/2015 to 04/16/2015    Use more than 4 hours 93.3%  average apnea-hypopnea index 1.2  CPAP pressure 8 cmH20    03/2015   PFT Normal     Component Results    Immunoglobulin E 137 (H) 0 - 100 IU/mL Final   Comment:   (NOTE)   Performed At: [facility]   [address] [place], [place] [id number]    Annamarie Major PhD [id number]             10/ 2016       Component Value Flag Ref Range Units Status   Alpha-1 Antitrypsin 149  90 - 200 mg/dL Final   Comment:   (NOTE)   Performed At: [facility]   [address] [place], [place] [id number]   Ricchiuti Oswaldo Done PhD [id number]      Phenotype (PI) MM             Component Value Flag Ref Range Units Status   Immunoglobulin E 136 (H) 0 - 100 IU/mL Final   D. pteronyssinus <0.10  Class 0 kU/L Final   D. farinae Mite <0.10  Class 0 kU/L Final   Cat Hair/Dander 0.28 (A) Class 0/I kU/L Final   Horse Epithelia/Dander <0.10  Class 0 kU/L Final   Cow hair and dander <0.10  Class 0 kU/L Final   Dog Hair/Dander 0.11 (A) Class 0/I kU/L Final   [place] Grass 0.12 (A) Class 0/I kU/L Final   Perennial Rye 0.29 (A) Class 0/I kU/L Final   Bluegrass, [place] 0.53 (A) Class I kU/L Final   [place] Grass 0.11 (A) Class 0/I kU/L Final   House Dust ([facility]) <0.10  Class 0 kU/L Final   Penicillium notatum 0.11 (A) Class 0/I kU/L Final   Cladosporium herbarum 0.49 (A) Class I kU/L Final   Aspergillus fumigatus 0.94 (A) Class II kU/L Final   Alternaria tenuis 0.73 (A) Class II kU/L Final   [facility] 0.14 (A) Class 0/I kU/L Final   White Oak 0.18 (A) Class 0/I kU/L Final   American White Elm 0.31 (A) Class 0/I kU/L Final   Olive Tree 0.15 (A) Class 0/I kU/L Final   [place] Tree 0.19 (A) Class 0/I kU/L Final   [place] <0.10  Class 0 kU/L Final   Ragweed, Short/Common 0.13 (A) Class 0/I kU/L Final   Mugwort <0.10  Class 0 kU/L Final   Plantain, English 0.24 (A) Class 0/I kU/L Final   Lamb's Quarter <0.10  Class 0 kU/L Final   Thistle, Russian 0.20 (A) Class 0/I kU/L Final   Comment:   (NOTE)              Lab Results   Component Value Date/Time   ?? WBC 8.9 12/26/2014 03:10 PM   ?? HGB 14.0 12/26/2014 03:10 PM   ?? HCT 40.6 12/26/2014 03:10 PM   ?? PLATELET 324 12/26/2014 03:10 PM   ?? MCV 82.4 12/26/2014 03:10 PM   ??  ??  ??  ??  Rheum   ??  3/ 2016    Scleroderma-70 Ab <0.2 ?? 0.0 - 0.9 AI Final   Comment:   (NOTE)     ??  Sjogren   Component Value Flag Ref Range Units Status   Sjogren's Anti-SS-A <0.2 ?? 0.0 - 0.9 AI Final   Sjogren's Anti-SS-B <0.2 ?? 0.0 - 0.9 AI Final   Comment:   ??  Component Value Flag Ref Range Units Status   CCP Antibodies IgG/IgA 3 ?? ?? ?? ??   ??  Rheumatoid factor 10.5 ?? 0.0 - 13.9 IU/mL Final   Comment:   (NOTE)   Performed At: [facility]   [address] [place], [place] [id number]   Annamarie Major PhD [id number]     ??  Component Value Flag Ref Range Units Status   TSH 2.31 ?? 0.35 - 3.74 uIU/mL Final   T4, Total 8.6 ?? 4.7 - 13.3 ug/dL Final   T3 Uptake 32 ?? 31 - 39 % Final   Free thyroxine index 2.8 ?? 1.4 - 5.2 ???? Final   Lab and Collection  ??  03/2014   ANA Direct NEGATIVE  ?? NEGATIVE ???? Final   Comment:   (NOTE)   Performed At: [facility]   [address] [place], [place]    ??  ??  ??  ??  Imaging and Other Studies:       CT Chest 10/ 2016 [facility]       Impression:  1.????No evidence of pulmonary emboli, aortic aneurysm, or dissection.  2.????Increased soft tissue opacity in the prevascular space for which residual  thymic tissue is favored over anterior mediastinal lymphadenopathy or mass. ---> discussed with rad - present on film at [facility]   Would recommend follow-up in the next six to eight weeks.  3.????Soft tissue stranding overlying the left anterior lateral abdominal wall.  This could be related to a focal muscular injury or contusion.    CT Chest 8/ 2016   ??  The heart is normal in size. Thoracic aorta is normal in size and enhances  homogeneously. No abnormal hilar, mediastinal, or axillary adenopathy. Central  airway is maintained throughout.  ????  Normal lung volumes without infiltrates or effusions. Pleural surfaces are  unremarkable. Visualized upper abdomen and osseous structures are unremarkable.  ????  IMPRESSION  IMPRESSION: NO ACUTE PROCESS IDENTIFIED.  ??  ??  OCST   5/ 2016   ??  ahi 5.8/ HR   Snoring   Hypoxemia   ??  ??  Cpap   Titration - 8cmH20 - RDI 2.1   Eppileptiform activity noted   ??  ??  CBC 7/ 2016   Wbc 8.9   Eos abs 0.2  ??  ASSESSMENT:  (Medical Decision Making)       ICD-10-CM ICD-9-CM    1. Mild persistent asthma without complication J45.30 493.90 fluticasone-vilanterol (BREO ELLIPTA) 100-25 mcg/dose inhaler   2. OSA on CPAP G47.33 327.23           PLAN:     Orders Placed This Encounter   ??? fluticasone-vilanterol (BREO ELLIPTA) 100-25 mcg/dose inhaler      Continue with CPAP nightly  Advised against driving if sleepy  RTC - 3 months    Levon Hedger, MD  Electronically signed

## 2015-05-06 ENCOUNTER — Encounter

## 2015-05-06 NOTE — Telephone Encounter (Signed)
Patient wants refills on Losartan and Ranitidine. Last seen on

## 2015-05-08 MED ORDER — RANITIDINE 150 MG TAB
150 mg | ORAL_TABLET | Freq: Every evening | ORAL | 2 refills | Status: DC
Start: 2015-05-08 — End: 2015-05-09

## 2015-05-08 MED ORDER — LOSARTAN 25 MG TAB
25 mg | ORAL_TABLET | Freq: Every day | ORAL | 5 refills | Status: DC
Start: 2015-05-08 — End: 2015-05-09

## 2015-05-09 ENCOUNTER — Encounter

## 2015-05-09 MED ORDER — RANITIDINE 150 MG TAB
150 mg | ORAL_TABLET | Freq: Every evening | ORAL | 1 refills | Status: DC
Start: 2015-05-09 — End: 2015-09-10

## 2015-05-09 MED ORDER — LOSARTAN 25 MG TAB
25 mg | ORAL_TABLET | Freq: Every day | ORAL | 1 refills | Status: DC
Start: 2015-05-09 — End: 2015-08-21

## 2015-05-09 NOTE — Telephone Encounter (Signed)
[facility] called.  They want a 90 day prescription for the Losartan and Ranitidine instead of the 30 days prescription.

## 2015-05-15 ENCOUNTER — Inpatient Hospital Stay: Admit: 2015-05-15 | Payer: PRIVATE HEALTH INSURANCE | Attending: Neurology | Primary: Family Medicine

## 2015-05-15 DIAGNOSIS — R6889 Other general symptoms and signs: Secondary | ICD-10-CM

## 2015-05-15 NOTE — Procedures (Signed)
[facility]  ELECTROENCEPHALOGRAM    Name:  Nater, Korrine S  MR #:  [id number]  Account #:  [id number]  DOB:  01/27/1971  Age:  44Y  Ref MD:  Ottie Tillery      Location:   OP   Date of Service:  05/15/2015    ELECTROENCEPHALOGRAM REPORT    INDICATION FOR STUDY:  This is a 44 year old female who has a history of migraine with  confusional spells.      EEG DESCRIPTION:   When maximally awake, posterior dominant rhythm consisted of 9-10  Hertz alpha rhythm.  Drowsiness is characterized by diffuse theta  and delta slowing.  Photic stimulation produced a driving response.  Throughout the recording there was increasing pretense of the  electrode 01 producing electrode pop artifacts and slowing artifact.    IMPRESSION:   This is a normal awake and asleep EEG.      CONCLUSION:   There were no epileptiform discharges or electrographic seizures  noted.                  ___________________________________  Zaylee Cornia, M.D., Clinical Neurophysiology    SK:acw  DD:05/18/2015 14:12:28   DT: 05/19/2015 10:16:10   Job ID:  [id number]  CC:

## 2015-05-18 NOTE — Procedures (Signed)
[facility]  ELECTROENCEPHALOGRAM    Name:  Jacqueline [name], [name]  MR #:  [id number]  Account #:  [id number]  DOB:  [birth year]  Age:  44Y  Ref MD:  Chauncy LeanKONERU, Ksenia Kunz      Location:   OP   Date of Service:  05/15/2015    ELECTROENCEPHALOGRAM REPORT    INDICATION FOR STUDY:  This is a 44 year old female who has a history of migraine with  confusional spells.      EEG DESCRIPTION:   When maximally awake, posterior dominant rhythm consisted of 9-10  Hertz alpha rhythm.  Drowsiness is characterized by diffuse theta  and delta slowing.  Photic stimulation produced a driving response.  Throughout the recording there was increasing pretense of the  electrode 01 producing electrode pop artifacts and slowing artifact.    IMPRESSION:   This is a normal awake and asleep EEG.      CONCLUSION:   There were no epileptiform discharges or electrographic seizures  noted.                  ___________________________________  Chauncy LeanSreekanth Walker Sitar, M.D., Clinical Neurophysiology    SK:acw  DD:05/18/2015 14:12:28   DT: 05/19/2015 10:16:10   Job ID:  [id number]  CC:

## 2015-05-23 ENCOUNTER — Encounter

## 2015-05-23 NOTE — Telephone Encounter (Signed)
Patient wants refills on Topicort cream and Maxzide. Last seen on 04/05/2015

## 2015-05-24 MED ORDER — DESOXIMETASONE 0.25 % TOPICAL CREAM
0.25 % | Freq: Two times a day (BID) | CUTANEOUS | 0 refills | Status: AC
Start: 2015-05-24 — End: ?

## 2015-05-24 MED ORDER — TRIAMTERENE-HYDROCHLOROTHIAZIDE 75 MG-50 MG TAB
75-50 mg | ORAL_TABLET | ORAL | 5 refills | Status: AC
Start: 2015-05-24 — End: ?

## 2015-06-11 ENCOUNTER — Ambulatory Visit
Admit: 2015-06-11 | Discharge: 2015-06-11 | Payer: PRIVATE HEALTH INSURANCE | Attending: Neurology | Primary: Family Medicine

## 2015-06-11 DIAGNOSIS — G43109 Migraine with aura, not intractable, without status migrainosus: Secondary | ICD-10-CM

## 2015-06-11 NOTE — Progress Notes (Signed)
Chauncy Lean, MD   [address], [address] [place], [place] [address]  Phone:  [phone]  Fax:  [phone]      Patient ID  Name:  Jacqueline [name]  DOB:  Jun 17, 1971  MRN:  [id number]  Age:  44 y.o.  PCP:  Donnella Bi, DO    Subjective:     Encounter Date:  06/11/2015    Referring Physician: No ref. provider found    Chief Complaint   Patient presents with   ??? Follow-up     This patient is here today for a three month follow up. Patient states " My headaches are much better and since my allergist has been treating me , Im doing great"       History of Present Illness:   Headaches better after immunotherapy  Could not tolerate topirmate    Current Outpatient Prescriptions on File Prior to Visit   Medication Sig Dispense Refill   ??? desoximetasone (TOPICORT) 0.25 % topical cream Apply  to affected area two (2) times a day. 100 g 0   ??? triamterene-hydroCHLOROthiazide (MAXZIDE) 75-50 mg per tablet TAKE 1 TABLET BY MOUTH EVERY DAY 30 Tab 5   ??? raNITIdine (ZANTAC) 150 mg tablet Take 2 Tabs by mouth nightly. 180 Tab 1   ??? losartan (COZAAR) 25 mg tablet Take 1 Tab by mouth daily. For blood pressure and renal protection 90 Tab 1   ??? fluticasone-vilanterol (BREO ELLIPTA) 100-25 mcg/dose inhaler Take 1 Puff by inhalation daily.     ??? fluticasone-vilanterol (BREO ELLIPTA) 100-25 mcg/dose inhaler Take 1 Puff by inhalation daily. 1 Inhaler 6   ??? cyclobenzaprine (FLEXERIL) 10 mg tablet Take 10 mg by mouth.     ??? topiramate (TOPAMAX) 50 mg tablet Take 1 Tab by mouth nightly. 60 Tab 3   ??? potassium chloride SR (KLOR-CON 10) 10 mEq tablet Take  by mouth. TAKE 2 TABS DAILY     ??? montelukast (SINGULAIR) 10 mg tablet TAKE 1 TAB BY MOUTH DAILY. 90 Tab 1   ??? albuterol (PROAIR HFA) 90 mcg/actuation inhaler Take 2 Puffs by inhalation every six (6) hours as needed for Wheezing. 1 Inhaler 11   ??? tiZANidine (ZANAFLEX) 4 mg capsule TAKE ONE CAPSULE BY MOUTH 3 TIMES A DAY 90 Cap 2    ??? polyethylene glycol (MIRALAX) 17 gram/dose powder Take 17 g by mouth daily. Mix in 1 glass of water and drink for constipation 1530 g 0     No current facility-administered medications on file prior to visit.       Allergies   Allergen Reactions   ??? Peanut Anaphylaxis   ??? Almond Other (comments)   ??? Lisinopril Cough   ??? Pcn [Penicillins] Hives   ??? Sesame Oil Other (comments)     Slight allergy   ??? Sulfa (Sulfonamide Antibiotics) Hives   ??? Ultram [Tramadol] Other (comments)     fainted     Patient Active Problem List   Diagnosis Code   ??? Chest pain R07.9   ??? Heart palpitations R00.2   ??? Peripheral edema R60.9   ??? Muscle spasms of lower extremity M62.838   ??? Sickle cell trait ([name]) D57.3   ??? HTN (hypertension) I10   ??? Asthma J45.909   ??? OSA on CPAP G47.33     Past Medical History   Diagnosis Date   ??? Allergic    ??? Asthma    ??? Headache    ??? Hypertension    ???  Infertility, female    ??? Nausea & vomiting    ??? Sickle cell trait ([name])    ??? Unspecified adverse effect of anesthesia      Difficult intubation      Past Surgical History   Procedure Laterality Date   ??? Pr breast surgery procedure unlisted       breast reduction   ??? Hx heent       tonsillectomy   ??? Hx other surgical       excision of lipoma, left abd   ??? Hx other surgical  05/29/2013     tennis elbow, tendon release       Family History   Problem Relation Age of Onset   ??? Stroke Mother    ??? Cancer Father    ??? Heart Disease Father    ??? Diabetes Father       Social History     Social History   ??? Marital status: MARRIED     Spouse name: Thereasa DistanceRodney   ??? Number of children: 0   ??? 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 Smoking status: Never Smoker   ??? Smokeless tobacco: Never Used   ??? Alcohol use No   ??? Drug use: No   ??? Sexual activity: Not Asked     Other Topics Concern   ??? None     Social History Narrative    Patient does drive        Uses: ProAir inh. And Breo    Uses: CPAP machine w/ nasal mask         DME: [facility]           Review of Systems:  Review of Systems   Constitutional: Negative for fever.   Eyes: Negative for blurred vision.   Respiratory: Negative for cough.    Cardiovascular: Negative for chest pain.   Gastrointestinal: Negative for heartburn.   Genitourinary: Negative for dysuria.   Musculoskeletal: Negative for myalgias.   Skin: Negative for rash.   Neurological: Negative for dizziness and headaches.   Endo/Heme/Allergies: Does not bruise/bleed easily.   Psychiatric/Behavioral: Negative for depression.         Objective:     Vitals:    06/11/15 1046   BP: 118/82   Pulse: 69   Weight: 82.6 kg (182 lb)   Height: 5\' 2"  (1.575 m)       Physical Exam:  Neurologic Exam     Mental Status   Oriented to person, place, and time.     Cranial Nerves   Cranial nerves II through XII intact.     Motor Exam   Muscle bulk: normal  Overall muscle tone: normal    Strength   Strength 5/5 throughout.     Sensory Exam   Light touch normal.     Gait, Coordination, and Reflexes     Gait  Gait: normal          Impression:   44 year old female with HA better after immunotherapy for HA  Plan:   1) She can cut down on topirmate, if HA comes back she can f/u with me    2) will see her as needed.    Follow-up Disposition:  Return if symptoms worsen or fail to improve.  Signed By:  Chauncy LeanSreekanth Tiras Bianchini, MD     06/11/2015

## 2015-07-16 ENCOUNTER — Encounter: Attending: Family Medicine | Primary: Family Medicine

## 2015-07-16 ENCOUNTER — Ambulatory Visit
Admit: 2015-07-16 | Discharge: 2015-07-16 | Payer: PRIVATE HEALTH INSURANCE | Attending: Family Medicine | Primary: Family Medicine

## 2015-07-16 DIAGNOSIS — I10 Essential (primary) hypertension: Secondary | ICD-10-CM

## 2015-07-16 NOTE — Progress Notes (Signed)
HISTORY OF PRESENT ILLNESS  Jacqueline [name] is a 45 y.o. female.    has a past medical history of Allergic; Asthma; Headache; Hypertension; Infertility, female; Nausea & vomiting; Sickle cell trait ([name]); and Unspecified adverse effect of anesthesia.    Chief Complaint   Patient presents with   ??? Follow Up Chronic Condition       HPI        Patient is here for follow-up of hypertension.  She indicates that she is not having any symptoms referable to her hypertension. Denies headaches, chest pain, neurological deficits, fatigue, SOBOE.  She is not exercising and tries to be adherent to low salt diet.  Blood pressure is being checked and is well controlled at home.     BP Readings from Last 3 Encounters:   07/16/15 110/60   06/11/15 118/82   04/18/15 124/76      Seeing Allergies and pulmonologist- will defer care to her specialist for her Asthma- states her breathing is much better  ROS  A thorough 10 point review of systems was done and was unremarkable except for HPI and chronic medical conditions.     Past Medical History   Diagnosis Date   ??? Allergic    ??? Asthma    ??? Headache    ??? Hypertension    ??? Infertility, female    ??? Nausea & vomiting    ??? Sickle cell trait ([name])    ??? Unspecified adverse effect of anesthesia      Difficult intubation     Past Surgical History   Procedure Laterality Date   ??? Pr breast surgery procedure unlisted       breast reduction   ??? Hx heent       tonsillectomy   ??? Hx other surgical       excision of lipoma, left abd   ??? Hx other surgical  05/29/2013     tennis elbow, tendon release      Family History   Problem Relation Age of Onset   ??? Stroke Mother    ??? Cancer Father    ??? Heart Disease Father    ??? Diabetes Father      Social History     Social History   ??? Marital status: MARRIED     Spouse name: Thereasa Distance   ??? Number of children: 0   ??? 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 Smoking status: Never Smoker    ??? Smokeless tobacco: Never Used   ??? Alcohol use No   ??? Drug use: No   ??? Sexual activity: Not on file     Other Topics Concern   ??? Not on file     Social History Narrative    Patient does drive        Uses: ProAir inh. And Breo    Uses: CPAP machine w/ nasal mask        DME: [facility]         Allergies   Allergen Reactions   ??? Peanut Anaphylaxis   ??? Almond Other (comments)   ??? Lisinopril Cough   ??? Pcn [Penicillins] Hives   ??? Sesame Oil Other (comments)     Slight allergy   ??? Sulfa (Sulfonamide Antibiotics) Hives   ??? Ultram [Tramadol] Other (comments)     fainted     Current Outpatient Prescriptions on File  Prior to Visit   Medication Sig Dispense Refill   ??? desoximetasone (TOPICORT) 0.25 % topical cream Apply  to affected area two (2) times a day. 100 g 0   ??? triamterene-hydroCHLOROthiazide (MAXZIDE) 75-50 mg per tablet TAKE 1 TABLET BY MOUTH EVERY DAY 30 Tab 5   ??? raNITIdine (ZANTAC) 150 mg tablet Take 2 Tabs by mouth nightly. 180 Tab 1   ??? losartan (COZAAR) 25 mg tablet Take 1 Tab by mouth daily. For blood pressure and renal protection 90 Tab 1   ??? fluticasone-vilanterol (BREO ELLIPTA) 100-25 mcg/dose inhaler Take 1 Puff by inhalation daily.     ??? fluticasone-vilanterol (BREO ELLIPTA) 100-25 mcg/dose inhaler Take 1 Puff by inhalation daily. 1 Inhaler 6   ??? cyclobenzaprine (FLEXERIL) 10 mg tablet Take 10 mg by mouth.     ??? topiramate (TOPAMAX) 50 mg tablet Take 1 Tab by mouth nightly. 60 Tab 3   ??? potassium chloride SR (KLOR-CON 10) 10 mEq tablet Take  by mouth. TAKE 2 TABS DAILY     ??? montelukast (SINGULAIR) 10 mg tablet TAKE 1 TAB BY MOUTH DAILY. 90 Tab 1   ??? albuterol (PROAIR HFA) 90 mcg/actuation inhaler Take 2 Puffs by inhalation every six (6) hours as needed for Wheezing. 1 Inhaler 11   ??? tiZANidine (ZANAFLEX) 4 mg capsule TAKE ONE CAPSULE BY MOUTH 3 TIMES A DAY 90 Cap 2   ??? polyethylene glycol (MIRALAX) 17 gram/dose powder Take 17 g by mouth daily. Mix in 1 glass of water and drink for constipation 1530 g 0      No current facility-administered medications on file prior to visit.        Vitals:    07/16/15 0908   BP: 110/60   Pulse: 78   Resp: 18   Temp: 98 ??F (36.7 ??C)   TempSrc: Temporal   SpO2: 98%   Weight: 187 lb (84.8 kg)   Height:  (1.575 m)   .  Body mass index is 34.2 kg/(m^2).        Physical Exam    General: NAD. A x O x 3     Skin: no evidence of rashes, scars, or moles    HEENT: Head: atraumatic normocephalic, Eyes:  PERLA, EOMI Ears: without discharge or pain of external exam Nares: patent Throat: with out erythema     Neck: no thyromegaly    Heart:  RRR, S1S2, no murmur, gallop, or rub    Lungs:   CTA bilaterally     Abdomen:  Soft, nontender, BS x 4    Extremities:  Strength 4/4, no cyanosis, no edema      ASSESSMENT and PLAN    ICD-10-CM ICD-9-CM    1. Mild persistent asthma without complication J45.30 493.90 CBC WITH AUTOMATED DIFF      METABOLIC PANEL, COMPREHENSIVE      LIPID PANEL   2. Essential hypertension I10 401.9 CBC WITH AUTOMATED DIFF      METABOLIC PANEL, COMPREHENSIVE      LIPID PANEL     Follow-up Disposition:  Return in about 6 months (around 01/13/2016).  lab results and schedule of future lab studies reviewed with patient    I have discussed the above plan with the patient. Patient has also been given instructions and information on her conditions.  Patient is aware of all possible side effects and agrees with the above mentioned plan.       Donnella Bi, DO

## 2015-07-16 NOTE — Patient Instructions (Signed)
[facility]  Office working hours are Monday - Friday 8:00AM-5:00PM.  Office phone number is [phone] and fax is [phone]  For non-emergent medical care and clinical advice during office hours:   1. Call office or   2.  Send message or request using MyChart  For non-emergent medical care and clinical advice after office hours:  1.  Send message or request using MyChart   2.  Call [phone] and leave a message with our answering service or    Emergency care can be obtained at the [facility] ER, Urgent Care or by calling 911.    Patient Satisfaction Survey  As a valued patient, you will be receiving a survey from [employer].  We encourage you to share your thoughts and opinions about the care you received today. Thank you for choosing [facility]           BRING ALL MEDICATION BOTTLES TO EVERY APPOINTMENT    Medication Refills -   * Please do not let your medication run out before calling for a refill, the best practice would be to call when you have 2 weeks of medication left.  * Please allow 48 hours for medications to be called into your pharmacy when you request a medication refill      Have MyChart? Use it to request medication refills, it's quick and easy! If you have questions about MyChart please feel free to ask our staff!

## 2015-07-23 ENCOUNTER — Encounter: Attending: Critical Care Medicine | Primary: Family Medicine

## 2015-08-13 ENCOUNTER — Encounter: Attending: Family Medicine | Primary: Family Medicine

## 2015-08-21 ENCOUNTER — Encounter

## 2015-08-21 MED ORDER — LOSARTAN 25 MG TAB
25 mg | ORAL_TABLET | Freq: Every day | ORAL | 1 refills | Status: AC
Start: 2015-08-21 — End: ?

## 2015-09-10 ENCOUNTER — Encounter

## 2015-09-10 MED ORDER — RANITIDINE 150 MG TAB
150 mg | ORAL_TABLET | Freq: Every evening | ORAL | 1 refills | Status: AC
Start: 2015-09-10 — End: ?

## 2015-09-10 MED ORDER — MONTELUKAST 10 MG TAB
10 mg | ORAL_TABLET | ORAL | 1 refills | Status: AC
Start: 2015-09-10 — End: ?

## 2015-09-10 NOTE — Telephone Encounter (Signed)
Patient wants refills on Singulair and Ranitidine.  [place] moving to [place] at end of month

## 2015-09-11 ENCOUNTER — Inpatient Hospital Stay: Admit: 2015-09-11 | Payer: PRIVATE HEALTH INSURANCE | Primary: Family Medicine

## 2015-09-11 ENCOUNTER — Institutional Professional Consult (permissible substitution): Admit: 2015-09-11 | Discharge: 2015-09-11 | Payer: PRIVATE HEALTH INSURANCE | Primary: Family Medicine

## 2015-09-11 DIAGNOSIS — I10 Essential (primary) hypertension: Secondary | ICD-10-CM

## 2015-09-11 DIAGNOSIS — J4530 Mild persistent asthma, uncomplicated: Secondary | ICD-10-CM

## 2015-09-11 NOTE — Patient Instructions (Signed)
[facility]  Office working hours are Monday - Friday 8:00AM-5:00PM.  Office phone number is [phone] and fax is [phone]  For non-emergent medical care and clinical advice during office hours:   1. Call office or   2.  Send message or request using MyChart  For non-emergent medical care and clinical advice after office hours:  1.  Send message or request using MyChart   2.  Call [phone] and leave a message with our answering service or    Emergency care can be obtained at the [facility] ER, Urgent Care or by calling 911.    Patient Satisfaction Survey  As a valued patient, you will be receiving a survey from [employer].  We encourage you to share your thoughts and opinions about the care you received today. Thank you for choosing [facility]           BRING ALL MEDICATION BOTTLES TO EVERY APPOINTMENT    Medication Refills -   * Please do not let your medication run out before calling for a refill, the best practice would be to call when you have 2 weeks of medication left.  * Please allow 48 hours for medications to be called into your pharmacy when you request a medication refill      Have MyChart? Use it to request medication refills, it's quick and easy! If you have questions about MyChart please feel free to ask our staff!

## 2015-09-12 LAB — METABOLIC PANEL, COMPREHENSIVE
A-G Ratio: 1 — ABNORMAL LOW (ref 1.2–2.2)
ALT (SGPT): 20 U/L (ref 12–78)
AST (SGOT): 16 U/L (ref 15–37)
Albumin: 3.8 g/dL (ref 3.4–5.0)
Alk. phosphatase: 66 U/L (ref 45–117)
Anion gap: 7 mmol/L (ref 6–15)
BUN/Creatinine ratio: 11 (ref 7–25)
BUN: 9 MG/DL (ref 7–18)
Bilirubin, total: 0.4 MG/DL (ref <1.1)
CO2: 28 mmol/L (ref 21–32)
Calcium: 9 MG/DL (ref 8.5–10.1)
Chloride: 106 mmol/L (ref 98–107)
Creatinine: 0.82 MG/DL (ref 0.60–1.30)
GFR est AA: >60 mL/min/{1.73_m2} (ref >60)
GFR est non-AA: >60 mL/min/{1.73_m2} (ref >60)
Globulin: 3.7 g/dL — ABNORMAL HIGH (ref 2.4–3.5)
Glucose: 96 mg/dL (ref 70–110)
Potassium: 3.9 mmol/L (ref 3.5–5.3)
Protein, total: 7.5 g/dL (ref 6.4–8.2)
Sodium: 141 mmol/L (ref 136–145)

## 2015-09-12 LAB — LIPID PANEL
Cholesterol, total: 171 MG/DL (ref <200)
HDL Cholesterol: 57 MG/DL (ref 32–96)
LDL, calculated: 105.04 MG/DL (ref <130)
Triglyceride: 56 MG/DL (ref <150)
VLDL, calculated: 11.2 MG/DL (ref 5–32)

## 2015-09-12 LAB — CBC WITH AUTOMATED DIFF
ABS. BASOPHILS: 0 10*3/uL (ref 0.0–0.1)
ABS. EOSINOPHILS: 0.2 10*3/uL (ref 0.0–0.5)
ABS. LYMPHOCYTES: 2 10*3/uL (ref 0.8–3.5)
ABS. MONOCYTES: 0.5 10*3/uL — ABNORMAL LOW (ref 2.0–8.0)
ABS. NEUTROPHILS: 3.2 10*3/uL (ref 1.5–8.0)
BASOPHILS: 0 % (ref 0–2)
EOSINOPHILS: 3 % (ref 0–5)
HCT: 39 % — ABNORMAL LOW (ref 41–53)
HGB: 12.5 g/dL (ref 12.0–16.0)
LYMPHOCYTES: 34 % (ref 19–48)
MCH: 28.1 PG (ref 27–31)
MCHC: 32.1 g/dL (ref 31–37)
MCV: 87.6 FL (ref 80–100)
MONOCYTES: 9 % (ref 3–9)
MPV: 11 FL — ABNORMAL HIGH (ref 5.9–10.3)
NEUTROPHILS: 54 % (ref 40–74)
PLATELET: 250 10*3/uL (ref 130–400)
RBC: 4.45 M/uL (ref 4.2–5.4)
RDW: 15.3 % — ABNORMAL HIGH (ref 11.5–14.5)
WBC: 5.9 10*3/uL (ref 4.5–10.8)

## 2015-09-12 NOTE — Progress Notes (Signed)
Overall labs work looks good; there are a few labs that are outside the standard normal range, clinical significance doubtful

## 2015-09-12 NOTE — Progress Notes (Signed)
Mailed letter to patient with results.

## 2015-09-17 ENCOUNTER — Inpatient Hospital Stay: Admit: 2015-09-17 | Payer: PRIVATE HEALTH INSURANCE | Attending: Family Medicine | Primary: Family Medicine

## 2015-09-17 DIAGNOSIS — Z1231 Encounter for screening mammogram for malignant neoplasm of breast: Secondary | ICD-10-CM

## 2015-10-15 ENCOUNTER — Encounter: Attending: Family Medicine | Primary: Family Medicine

## 2015-11-26 ENCOUNTER — Encounter (HOSPITAL_COMMUNITY): Payer: Self-pay | Admitting: *Deleted

## 2015-11-26 ENCOUNTER — Ambulatory Visit (HOSPITAL_COMMUNITY): Payer: 59

## 2015-11-26 ENCOUNTER — Ambulatory Visit (HOSPITAL_COMMUNITY)
Admission: EM | Admit: 2015-11-26 | Discharge: 2015-11-26 | Disposition: A | Discharge status: Home / Self Care | Payer: 59 | Attending: Emergency Medicine | Admitting: Emergency Medicine

## 2015-11-26 DIAGNOSIS — Z8679 Personal history of other diseases of the circulatory system: Secondary | ICD-10-CM

## 2015-11-26 DIAGNOSIS — R51 Headache: Secondary | ICD-10-CM

## 2015-11-26 DIAGNOSIS — M25521 Pain in right elbow: Secondary | ICD-10-CM | POA: Diagnosis not present

## 2015-11-26 DIAGNOSIS — W228XXA Striking against or struck by other objects, initial encounter: Secondary | ICD-10-CM | POA: Insufficient documentation

## 2015-11-26 DIAGNOSIS — R200 Anesthesia of skin: Secondary | ICD-10-CM | POA: Diagnosis not present

## 2015-11-26 DIAGNOSIS — R202 Paresthesia of skin: Secondary | ICD-10-CM

## 2015-11-26 DIAGNOSIS — R519 Headache, unspecified: Secondary | ICD-10-CM

## 2015-11-26 HISTORY — DX: Essential (primary) hypertension: I10

## 2015-11-26 HISTORY — DX: Unspecified asthma, uncomplicated: J45.909

## 2015-11-26 NOTE — ED Notes (Signed)
PT REPORTS  EPISODE  OF  TINGLING   A ND  PAIN R  ARM     X  3-4   WEEKS     PT  STATES  SHE  BUMPED  HER  ARM     ABOUT  THAT  TIME  AND  HAS  HAD  THE  SYMPTOMS  SINCE THEN       SHE REPORTS  A  HISTORY  OF TENDONITIS  AS  WELL    She  Reports  Headache       But  Did  Not take her  bp  meds  Today

## 2015-11-26 NOTE — ED Notes (Signed)
Patient stated that she was here with right arm numbness and tingling in the right fingers. Reported statement to nurse Nira Connony Bowles, patient brought back

## 2015-11-26 NOTE — ED Provider Notes (Signed)
CSN: [id number]     Arrival date & time 11/26/15  1320 History   First MD Initiated Contact with Patient 11/26/15 1508     Chief Complaint  Patient presents with  . Arm Problem   (Consider location/radiation/quality/duration/timing/severity/associated sxs/prior Treatment) HPI The pt is a 45yo [facility]/o intermittent Right arm numbness and tingling with soreness starting at her Right elbow for 3-4 weeks. She reports having tendon release surgery on it about 3 years ago in [place] and started noticing current symptoms after accidentally bumping it on the side mirror of a car. She has been taking Tylenol occasionally w/o relief of symptoms. She has not used ice or heat on her elbow. Denies pain at this time. Denies weakness in her hand. She is Right hand dominant.    Pt also c/o mild generalized headache but notes she did not take her BP medication today as she did not plan on being in UC.  Denies dizziness, nausea, vomiting, chest pain or SOB.    Past Medical History  Diagnosis Date  . Hypertension   . Asthma    Past Surgical History  Procedure Laterality Date  . Breast surgery     History reviewed. No pertinent family history. Social History  Substance Use Topics  . Smoking status: Never Smoker   . Smokeless tobacco: None  . Alcohol Use: Yes   OB History    No data available     Review of Systems  Constitutional: Negative for fever and chills.  Musculoskeletal: Positive for myalgias and arthralgias. Negative for back pain, joint swelling, gait problem, neck pain and neck stiffness.       Right elbow  Skin: Negative for color change and wound.  Neurological: Positive for numbness. Negative for weakness.    Allergies  Review of patient's allergies indicates no known allergies.  Home Medications   Prior to Admission medications   Medication Sig Start Date End Date Taking? Authorizing Provider  Fluticasone Furoate-Vilanterol (BREO ELLIPTA IN) Inhale  into the lungs.   Yes Historical Provider, MD  Loratadine (CLARITIN PO) Take by mouth.   Yes Historical Provider, MD  Montelukast Sodium (SINGULAIR PO) Take by mouth.   Yes Historical Provider, MD  Potassium Chloride (KLOR-CON PO) Take by mouth.   Yes Historical Provider, MD   Meds Ordered and Administered this Visit  Medications - No data to display  BP 146/92 mmHg  Pulse 87  Temp(Src) 99 F (37.2 C) (Oral)  Resp 16  SpO2 100%  LMP 11/26/2015 No data found.   Physical Exam  Constitutional: She is oriented to person, place, and time. She appears well-developed and well-nourished.  HENT:  Head: Normocephalic and atraumatic.  Eyes: EOM are normal. Pupils are equal, round, and reactive to light.  Neck: Normal range of motion. Neck supple.  No midline bone tenderness, no crepitus or step-offs.   Cardiovascular: Normal rate.   Pulses:      Radial pulses are 2+ on the right side.  Pulmonary/Chest: Effort normal. No respiratory distress.  Musculoskeletal: Normal range of motion. She exhibits tenderness. She exhibits no edema.  Right elbow: no deformity or edema. Full ROM. Mild tenderness over lateral epicondyle.  Right shoulder: full ROM w/o tenderness.  Right wrist: full ROM w/o tenderness. 5/5 grip strength bilaterally.   Neurological: She is alert and oriented to person, place, and time.  Right arm- normal sensation  Skin: Skin is warm and dry.  Right elbow: well healed surgical scar over lateral  epicondyl.  Skin in tact. No ecchymosis or erythema.   Psychiatric: She has a normal mood and affect. Her behavior is normal.  Nursing note and vitals reviewed.   ED Course  Procedures (including critical care time)  Labs Review Labs Reviewed - No data to display  Imaging Review No results found.    MDM   1. Right elbow pain   2. Numbness and tingling of right arm    Right elbow pain with intermittent numbness and tingling. PMS in tact on Right arm. No signs of  underlying infection. Elbow sleeve applied to Right elbow. Home care instructions provided.  Pt also c/o mild generalized headache. No red flag symptoms. Doubt SAH or other emergent cause of HA.  BP mildly elevated at 146/92. Encouraged to take BP medications at same time every day. Encouraged fluids and rest.  F/u with PCP as well as use resources provided for Sports Medicine or Orthopedist to further evaluate and treat if Right arm symptoms not improving in 1-2 weeks.  Patient verbalized understanding and agreement with treatment plan.     Junius Finner, PA-C 11/26/15 1614

## 2015-11-26 NOTE — ED Notes (Signed)
We  Do  Not  Have   Any  Elbow  Sleeves    Pt  Will  Pick  Up   One   At  The  Pharmacy

## 2015-11-26 NOTE — Discharge Instructions (Signed)
Please use the resources for Orthopedics and Sports Medicine to establish care with a specialist to evaluate your elbow if symptoms not improving with home care as you may need additional imaging and/or procedures.  For your headache, be sure to stay well hydrated, especially in the summer heat, and take medication as prescribed.

## 2016-01-07 ENCOUNTER — Encounter: Attending: Family Medicine | Primary: Family Medicine

## 2016-01-14 ENCOUNTER — Encounter: Attending: Family Medicine | Primary: Family Medicine

## 2016-05-18 ENCOUNTER — Ambulatory Visit: Payer: Self-pay | Admitting: Family Medicine

## 2016-05-21 ENCOUNTER — Ambulatory Visit: Payer: 59 | Admitting: Family Medicine

## 2016-06-05 ENCOUNTER — Encounter: Payer: Self-pay | Admitting: Family Medicine

## 2016-06-05 ENCOUNTER — Ambulatory Visit (INDEPENDENT_AMBULATORY_CARE_PROVIDER_SITE_OTHER): Payer: 59 | Admitting: Family Medicine

## 2016-06-05 VITALS — BP 143/84 | HR 85 | Ht 61.5 in | Wt 183.0 lb

## 2016-06-05 DIAGNOSIS — K219 Gastro-esophageal reflux disease without esophagitis: Secondary | ICD-10-CM | POA: Insufficient documentation

## 2016-06-05 DIAGNOSIS — J4520 Mild intermittent asthma, uncomplicated: Secondary | ICD-10-CM | POA: Diagnosis not present

## 2016-06-05 DIAGNOSIS — E669 Obesity, unspecified: Secondary | ICD-10-CM

## 2016-06-05 DIAGNOSIS — J45991 Cough variant asthma: Secondary | ICD-10-CM | POA: Insufficient documentation

## 2016-06-05 DIAGNOSIS — Z0000 Encounter for general adult medical examination without abnormal findings: Secondary | ICD-10-CM | POA: Diagnosis not present

## 2016-06-05 DIAGNOSIS — I10 Essential (primary) hypertension: Secondary | ICD-10-CM

## 2016-06-05 DIAGNOSIS — J3089 Other allergic rhinitis: Secondary | ICD-10-CM

## 2016-06-05 DIAGNOSIS — R011 Cardiac murmur, unspecified: Secondary | ICD-10-CM | POA: Insufficient documentation

## 2016-06-05 DIAGNOSIS — G4730 Sleep apnea, unspecified: Secondary | ICD-10-CM | POA: Diagnosis not present

## 2016-06-05 NOTE — Assessment & Plan Note (Addendum)
-   Had many tests by Cards---> all N per patient.  Obtain her [place] cardiologist office visit notes near future.

## 2016-06-05 NOTE — Assessment & Plan Note (Addendum)
Well controlled - once monthly albuterol use if NOT sick.   Continue Singulair and Clarinex

## 2016-06-05 NOTE — Patient Instructions (Addendum)
Cont to walk daily - about 30-4860min but inc intensity  Bring in Bp log next OV ( goal Bp 120's/70's)  and also, prior, come in for FBW  Please realize, EXERCISE IS MEDICINE!  -  American Heart Association [facility]( AHA) guidelines for exercise : If you are in good health, without any medical conditions, you should engage in 150 minutes of moderate intensity aerobic activity per week.  This means you should be huffing and puffing throughout your workout.   Engaging in regular exercise will improve brain function and memory, as well as improve mood, boost immune system and help with weight management.  As well as the other, more well-known effects of exercise such as decreasing blood sugar levels, decreasing blood pressure,  and decreasing bad cholesterol levels/ increasing good cholesterol levels.     -  The AHA strongly endorses consumption of a diet that contains a variety of foods from all the food categories with an emphasis on fruits and vegetables; fat-free and low-fat dairy products; cereal and grain products; legumes and nuts; and fish, poultry, and/or extra lean meats.    Excessive food intake, especially of foods high in saturated and trans fats, sugar, and salt, should be avoided.    Adequate water intake of roughly 1/2 of your weight in pounds, should equal the ounces of water per day you should drink.  So for instance, if you're 200 pounds, that would be 100 ounces of water per day.         Mediterranean Diet  Why follow it? Research shows. . Those who follow the Mediterranean diet have a reduced risk of heart disease  . The diet is associated with a reduced incidence of Parkinson's and Alzheimer's diseases . People following the diet may have longer life expectancies and lower rates of chronic diseases  . The Dietary Guidelines for Americans recommends the Mediterranean diet as an eating plan to promote health and prevent disease  What Is the Mediterranean Diet?  . Healthy eating plan  based on typical foods and recipes of Mediterranean-style cooking . The diet is primarily a plant based diet; these foods should make up a majority of meals   Starches - Plant based foods should make up a majority of meals - They are an important sources of vitamins, minerals, energy, antioxidants, and fiber - Choose whole grains, foods high in fiber and minimally processed items  - Typical grain sources include wheat, oats, barley, corn, brown rice, bulgar, farro, millet, polenta, couscous  - Various types of beans include chickpeas, lentils, fava beans, black beans, white beans   Fruits  Veggies - Large quantities of antioxidant rich fruits & veggies; 6 or more servings  - Vegetables can be eaten raw or lightly drizzled with oil and cooked  - Vegetables common to the traditional Mediterranean Diet include: artichokes, arugula, beets, broccoli, brussel sprouts, cabbage, carrots, celery, collard greens, cucumbers, eggplant, kale, leeks, lemons, lettuce, mushrooms, okra, onions, peas, peppers, potatoes, pumpkin, radishes, rutabaga, shallots, spinach, sweet potatoes, turnips, zucchini - Fruits common to the Mediterranean Diet include: apples, apricots, avocados, cherries, clementines, dates, figs, grapefruits, grapes, melons, nectarines, oranges, peaches, pears, pomegranates, strawberries, tangerines  Fats - Replace butter and margarine with healthy oils, such as olive oil, canola oil, and tahini  - Limit nuts to no more than a handful a day  - Nuts include walnuts, almonds, pecans, pistachios, pine nuts  - Limit or avoid candied, honey roasted or heavily salted nuts - Olives are central to the  Mediterranean diet - can be eaten whole or used in a variety of dishes   Meats Protein - Limiting red meat: no more than a few times a month - When eating red meat: choose lean cuts and keep the portion to the size of deck of cards - Eggs: approx. 0 to 4 times a week  - Fish and lean poultry: at least 2 a  week  - Healthy protein sources include, chicken, [place], lean beef, lamb - Increase intake of seafood such as tuna, salmon, trout, mackerel, shrimp, scallops - Avoid or limit high fat processed meats such as sausage and bacon  Dairy - Include moderate amounts of low fat dairy products  - Focus on healthy dairy such as fat free yogurt, skim milk, low or reduced fat cheese - Limit dairy products higher in fat such as whole or 2% milk, cheese, ice cream  Alcohol - Moderate amounts of red wine is ok  - No more than 5 oz daily for women (all ages) and men older than age 865  - No more than 10 oz of wine daily for men younger than 9765  Other - Limit sweets and other desserts  - Use herbs and spices instead of salt to flavor foods  - Herbs and spices common to the traditional Mediterranean Diet include: basil, bay leaves, chives, cloves, cumin, fennel, garlic, lavender, marjoram, mint, oregano, parsley, pepper, rosemary, sage, savory, sumac, tarragon, thyme   It's not just a diet, it's a lifestyle:  . The Mediterranean diet includes lifestyle factors typical of those in the region  . Foods, drinks and meals are best eaten with others and savored . Daily physical activity is important for overall good health . This could be strenuous exercise like running and aerobics . This could also be more leisurely activities such as walking, housework, yard-work, or taking the stairs . Moderation is the key; a balanced and healthy diet accommodates most foods and drinks . Consider portion sizes and frequency of consumption of certain foods   Meal Ideas & Options:  . Breakfast:  o Whole wheat toast or whole wheat English muffins with peanut butter & hard boiled egg o Steel cut oats topped with apples & cinnamon and skim milk  o Fresh fruit: banana, strawberries, melon, berries, peaches  o Smoothies: strawberries, bananas, greek yogurt, peanut butter o Low fat greek yogurt with blueberries and granola  o Egg  white omelet with spinach and mushrooms o Breakfast couscous: whole wheat couscous, apricots, skim milk, cranberries  . Sandwiches:  o Hummus and grilled vegetables (peppers, zucchini, squash) on whole wheat bread   o Grilled chicken on whole wheat pita with lettuce, tomatoes, cucumbers or tzatziki  o Tuna salad on whole wheat bread: tuna salad made with greek yogurt, olives, red peppers, capers, green onions o Garlic rosemary lamb pita: lamb sauted with garlic, rosemary, salt & pepper; add lettuce, cucumber, greek yogurt to pita - flavor with lemon juice and black pepper  . Seafood:  o Mediterranean grilled salmon, seasoned with garlic, basil, parsley, lemon juice and black pepper o Shrimp, lemon, and spinach whole-grain pasta salad made with low fat greek yogurt  o Seared scallops with lemon orzo  o Seared tuna steaks seasoned salt, pepper, coriander topped with tomato mixture of olives, tomatoes, olive oil, minced garlic, parsley, green onions and cappers  . Meats:  o Herbed greek chicken salad with kalamata olives, cucumber, feta  o Red bell peppers stuffed with spinach, bulgur, lean ground  beef (or lentils) & topped with feta   o Kebabs: skewers of chicken, tomatoes, onions, zucchini, squash  o [place] burgers: made with red onions, mint, dill, lemon juice, feta cheese topped with roasted red peppers . Vegetarian o Cucumber salad: cucumbers, artichoke hearts, celery, red onion, feta cheese, tossed in olive oil & lemon juice  o Hummus and whole grain pita points with a greek salad (lettuce, tomato, feta, olives, cucumbers, red onion) o Lentil soup with celery, carrots made with vegetable broth, garlic, salt and pepper  o Tabouli salad: parsley, bulgur, mint, scallions, cucumbers, tomato, radishes, lemon juice, olive oil, salt and pepper.

## 2016-06-05 NOTE — Progress Notes (Signed)
New patient office visit note:  Impression and Recommendations:    1. Encounter for wellness examination   2. Essential hypertension   3. Mild intermittent asthma without complication   4. Sleep apnea, unspecified type   5. Heart murmur   6. Gastroesophageal reflux disease, esophagitis presence not specified   7. Environmental and seasonal allergies   8. Obesity, Class I, BMI 30-34.9     Sleep apnea Continue cpap daily at bedtime  Obtain pulmonary records from [place].    Hypertension HTN dx 8-10 yrs ago. Strong fam hx.   ----> Discussed with patient goal blood pressure less than 130/80 on a regular basis.    Continue current medications for now.  Keep home blood pressure monitoring log and bring in next office visit.  Patient understands it still remains elevated, she will need medication adjustments.    Come in for fasting blood work between now and follow-up office visit so we can check kidney function, electrolytes etc prior to making any medication changes.    Heart murmur - Had many tests by Cards---> all N per patient.  Obtain her [place] cardiologist office visit notes near future.    Environmental and seasonal allergies Used to take allergy shots, but says currently under good control with Clarinex and singulair  - sinus rinses  - Humidifier by bedside at night    Asthma Well controlled - once monthly albuterol use if NOT sick.   Continue Singulair and Clarinex    GERD (gastroesophageal reflux disease) Continue on ranitidine as symptoms well-controlled on this.  Diet modifications discussed.  Weight loss encouraged.    Obesity, Class I, BMI 30-34.9 Body mass index is 34.02 kg/m.   Explained to patient what BMI refers to, and what it means medically.    Told patient to think about it as a "medical risk stratification measurement" and how increasing BMI is associated with increasing risk/ or worsening state of various  diseases such as hypertension, hyperlipidemia, diabetes, premature OA, depression etc.  American Heart Association guidelines for healthy diet, basically Mediterranean diet, and exercise guidelines of 30 minutes 5 days per week or more discussed in detail.  Health counseling performed.  All questions answered.  Handouts given    Encounter for wellness examination  Patient will come in in the near future for fasting blood work.    We will do routine labs.   Continue Current medications for now: BP: Triamterene-hydrochlorothiazide 75-50 milligrams, 1 by mouth daily Losartan 25 mg,  1 by mouth daily Potassium chloride; (K- Dur) 10 mEq daily  Environmental allergies: Singulair 10 mg, 1 by mouth daily at bedtime Clarinex 5 mg, 1 by mouth daily Proventil, 2 puffs every 6 hours when necessary  GERD: Ranitidine 150 mg, 1 by mouth twice a day The patient was counseled, risk factors were discussed, anticipatory guidance given.    Gross side effects, risk and benefits, and alternatives of medications discussed with patient.  Patient is aware that all medications have potential side effects and we are unable to predict every side effect or drug-drug interaction that may occur.  Expresses verbal understanding and consents to current therapy plan and treatment regimen.  Return for couple wks f/up with me--> FBW prior.  Please see AVS handed out to patient at the end of our visit for further patient instructions/ counseling done pertaining to today's office visit.    Note: This document was prepared using Conservation officer, historic buildingsDragon voice recognition software and may include  unintentional dictation errors.  ----------------------------------------------------------------------------------------------------------------------    Subjective:    Chief Complaint  Patient presents with  . Establish Care    HPI: Paula Lambert is a pleasant 45 y.o. female who presents to [facility] today to review their medical history with me and establish care.   I asked the patient to review their chronic problem list with me to ensure everything was updated and accurate.     --->   Moved here in 4/17 from [place].    Dr Paula Lambert- prior PCP in [place] was "very loving".    Patient's cardiologist and pulmonary doctors are in [place].  We will need to get those records.    --> I asked Paula Lambert and Paula Lambert to obtain them.  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  She is married to Paula Lambert.  No children.  Never smoked, only occasionally socially drinks.  No drugs, only sexually active with husband.   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 --->  Patient had a colonoscopy in 2011 are 2012 which was normal. Last mammogram 2016-normal  last Pap smear 2013-normal   Sleep apnea cpap- for 2 yrs or so.  Is sleeping better now she has it.  Feels more rested.    Hypertension HTN dx 8-10 yrs ago. Strong fam hx. -parents, her parents and uncle.  Has BP machine at home-->  Home BP's 128/ 80 last night,  Normally around 130's over 90's on average.  Hasn't had meds since may '17   Heart murmur - Had many tests by Cards---> all N, found to have a "leaky valve."  Patient asymptomatic.   Environmental and seasonal allergies Used to take allergy shots.  Symptoms have been well controlled.   Asthma Very well controlled - once monthly albuterol use if NOT sick.  Has albuterol inhaler.   Family history: Reviewed and significant for cancer, heart attacks, depression and suicide by an uncle, diabetes in both her parents, grandparents aunts and uncles.  High blood pressure and stroke among many relatives as well.    Wt Readings from Last 3 Encounters:  06/19/16 181 lb 9.6 oz (82.4 kg)  06/10/16 187 lb 6 oz (85 kg)  06/05/16 183  lb (83 kg)   BP Readings from Last 3 Encounters:  06/19/16 129/76  06/11/16 129/93  06/05/16 (!) 143/84   Pulse Readings from Last 3 Encounters:  06/19/16 82  06/11/16 111  06/05/16 85   BMI Readings from Last 3 Encounters:  06/19/16 33.76 kg/m  06/10/16 34.83 kg/m  06/05/16 34.02 kg/m    Patient Care Team    Relationship Specialty Notifications Start End  Thomasene Lot, DO PCP - General Family Medicine  06/05/16     Patient Active Problem List   Diagnosis Date Noted  . Pre-diabetes/ glucose intolerance  (new onset)  06/20/2016    Priority: High  . Elevated LDL cholesterol level 06/20/2016    Priority: High  . Obesity, Class I, BMI 30-34.9 06/20/2016    Priority: High  . Hypertension 06/05/2016    Priority: High  . Vaccination not carried out because of patient refusal 06/20/2016    Priority: Medium  . Asthma 06/05/2016    Priority: Medium  . Sleep apnea 06/05/2016    Priority: Medium  . Heart murmur 06/05/2016    Priority: Medium  . GERD (gastroesophageal  reflux disease) 06/05/2016    Priority: Medium  . Vitamin D deficiency 06/19/2016    Priority: Low  . Environmental and seasonal allergies 06/05/2016    Priority: Low  . Encounter for wellness examination 07/09/2016     Past Medical History:  Diagnosis Date  . Asthma   . GERD (gastroesophageal reflux disease)   . Heart murmur    leaking valve  . Hypertension   . Sleep apnea      Past Medical History:  Diagnosis Date  . Asthma   . GERD (gastroesophageal reflux disease)   . Heart murmur    leaking valve  . Hypertension   . Sleep apnea      Past Surgical History:  Procedure Laterality Date  . BREAST SURGERY     reduction  . LIPOMA EXCISION    . TENDON RELEASE Right    elbow  . TONSILLECTOMY       Family History  Problem Relation Age of Onset  . Aneurysm Mother   . Stroke Father   . Hypertension Father   . Prostate cancer Father   . Diabetes Father   . Aneurysm Maternal  Uncle   . Stroke Maternal Grandmother   . Hypertension Maternal Grandmother   . Heart disease Maternal Grandmother   . Diabetes Maternal Grandmother   . Aneurysm Maternal Grandfather   . Stroke Paternal Grandmother      History  Drug Use No    History  Alcohol Use  . Yes    Comment: occasionally    History  Smoking Status  . Never Smoker  Smokeless Tobacco  . Never Used    Current medications:  BP: Triamterene-hydrochlorothiazide 75-50 milligrams, 1 by mouth daily Losartan 25 mg,  1 by mouth daily Potassium chloride; (K- Dur) 10 mEq daily  Environmental allergies: Singulair 10 mg, 1 by mouth daily at bedtime Clarinex 5 mg, 1 by mouth daily Proventil, 2 puffs every 6 hours when necessary  GERD: Ranitidine 150 mg, 1 by mouth twice a day     Allergies: Amoxicillin; Penicillins; Sulfa antibiotics; and Ultram [tramadol hcl]  Review of Systems  Constitutional: Negative for chills, diaphoresis, fever, malaise/fatigue and weight loss.  HENT: Negative for tinnitus.   Eyes: Negative for photophobia.  Respiratory: Negative for cough and wheezing.   Cardiovascular: Positive for palpitations. Negative for chest pain.  Gastrointestinal: Positive for heartburn. Negative for blood in stool, constipation, diarrhea, nausea and vomiting.  Genitourinary: Negative for dysuria, frequency and urgency.  Musculoskeletal: Negative for joint pain and myalgias.  Skin: Negative for itching and rash.  Neurological: Negative for dizziness, weakness and headaches.  Endo/Heme/Allergies: Positive for environmental allergies. Negative for polydipsia. Does not bruise/bleed easily.  Psychiatric/Behavioral: Negative for depression. The patient is nervous/anxious. The patient does not have insomnia.      Objective:    Blood pressure (!) 143/84, pulse 85, height 5' 1.5" (1.562 m), weight 183 lb (83 kg), last menstrual period 05/31/2016. Body mass index is 34.02 kg/m. General:   Well  Developed, well nourished, and in no acute distress.  Neuro:   Alert and oriented x3, extra-ocular muscles intact, sensation grossly intact.  HEENT:   Normocephalic, atraumatic, pupils equal round reactive to light, neck supple, no JVD, No bruits.  Skin:   no gross rashes  Cardiac:   Regular rate and rhythm, + SEM- L SB Respiratory:   Essentially clear to auscultation bilaterally & A-P.  Not using accessory muscles, speaking in full sentences.  Abdominal:   not  grossly distended Musculoskeletal:   Ambulates w/o diff, FROM * 4 ext.  Vasc:   less 2 sec cap RF, warm and pink  Psych:   No HI/SI, judgement and insight good, Euthymic mood. Full Affect.

## 2016-06-05 NOTE — Assessment & Plan Note (Addendum)
HTN dx 8-10 yrs ago. Strong fam hx.   ----> Discussed with patient goal blood pressure less than 130/80 on a regular basis.    Continue current medications for now.  Keep home blood pressure monitoring log and bring in next office visit.  Patient understands it still remains elevated, she will need medication adjustments.    Come in for fasting blood work between now and follow-up office visit so we can check kidney function, electrolytes etc prior to making any medication changes.

## 2016-06-05 NOTE — Assessment & Plan Note (Addendum)
Used to take allergy shots, but says currently under good control with Clarinex and singulair  - sinus rinses  - Humidifier by bedside at night

## 2016-06-05 NOTE — Assessment & Plan Note (Addendum)
Continue cpap daily at bedtime  Obtain pulmonary records from [place] [facility].

## 2016-06-09 ENCOUNTER — Telehealth: Payer: Self-pay | Admitting: Family Medicine

## 2016-06-09 NOTE — Telephone Encounter (Signed)
Paula FussLakesha went into the Urgent Care yesterday to be seen due to her elevated blood pressure, her BP was  176/114. They started her on Losartan HCTZ 50-12.5mg  tablets, quantity 30.

## 2016-06-09 NOTE — Telephone Encounter (Signed)
We'll need to get her records from the urgent care she went to.    Additionally, please call patient and remind her she needs to come in for fasting blood work and a follow-up office visit with me for blood pressure recheck- please see my last note for details of this.

## 2016-06-10 ENCOUNTER — Emergency Department (HOSPITAL_COMMUNITY)
Admission: EM | Admit: 2016-06-10 | Discharge: 2016-06-11 | Disposition: A | Discharge status: Home / Self Care | Payer: 59 | Attending: Emergency Medicine | Admitting: Emergency Medicine

## 2016-06-10 ENCOUNTER — Emergency Department (HOSPITAL_COMMUNITY): Payer: 59

## 2016-06-10 ENCOUNTER — Other Ambulatory Visit: Payer: Self-pay

## 2016-06-10 ENCOUNTER — Other Ambulatory Visit (INDEPENDENT_AMBULATORY_CARE_PROVIDER_SITE_OTHER): Payer: 59

## 2016-06-10 ENCOUNTER — Encounter (HOSPITAL_COMMUNITY): Payer: Self-pay | Admitting: Vascular Surgery

## 2016-06-10 DIAGNOSIS — J029 Acute pharyngitis, unspecified: Secondary | ICD-10-CM | POA: Diagnosis present

## 2016-06-10 DIAGNOSIS — J45909 Unspecified asthma, uncomplicated: Secondary | ICD-10-CM | POA: Insufficient documentation

## 2016-06-10 DIAGNOSIS — R0602 Shortness of breath: Secondary | ICD-10-CM | POA: Diagnosis not present

## 2016-06-10 DIAGNOSIS — Z0000 Encounter for general adult medical examination without abnormal findings: Secondary | ICD-10-CM

## 2016-06-10 DIAGNOSIS — I10 Essential (primary) hypertension: Secondary | ICD-10-CM | POA: Diagnosis not present

## 2016-06-10 DIAGNOSIS — Z79899 Other long term (current) drug therapy: Secondary | ICD-10-CM | POA: Insufficient documentation

## 2016-06-10 DIAGNOSIS — R6889 Other general symptoms and signs: Secondary | ICD-10-CM

## 2016-06-10 LAB — COMPREHENSIVE METABOLIC PANEL
ALBUMIN: 4.1 g/dL (ref 3.5–5.0)
ALK PHOS: 59 U/L (ref 38–126)
ALT: 20 U/L (ref 14–54)
AST: 22 U/L (ref 15–41)
Anion gap: 8 (ref 5–15)
BILIRUBIN TOTAL: 1 mg/dL (ref 0.3–1.2)
BUN: 8 mg/dL (ref 6–20)
CALCIUM: 9.7 mg/dL (ref 8.9–10.3)
CO2: 28 mmol/L (ref 22–32)
CREATININE: 1.1 mg/dL — AB (ref 0.44–1.00)
Chloride: 99 mmol/L — ABNORMAL LOW (ref 101–111)
GFR calc Af Amer: >60 mL/min (ref >60)
GFR calc non Af Amer: >60 mL/min (ref >60)
GLUCOSE: 151 mg/dL — AB (ref 65–99)
Potassium: 3.6 mmol/L (ref 3.5–5.1)
SODIUM: 135 mmol/L (ref 135–145)
TOTAL PROTEIN: 7.1 g/dL (ref 6.5–8.1)

## 2016-06-10 LAB — POCT GLYCOSYLATED HEMOGLOBIN (HGB A1C): Hemoglobin A1C: 6.3

## 2016-06-10 LAB — CBC WITH DIFFERENTIAL/PLATELET
BASOS PCT: 0 %
Basophils Absolute: 0 10*3/uL (ref 0.0–0.1)
EOS ABS: 0.1 10*3/uL (ref 0.0–0.7)
EOS PCT: 1 %
HEMATOCRIT: 37.3 % (ref 36.0–46.0)
Hemoglobin: 12.6 g/dL (ref 12.0–15.0)
Lymphocytes Relative: 15 %
Lymphs Abs: 1.6 10*3/uL (ref 0.7–4.0)
MCH: 28.4 pg (ref 26.0–34.0)
MCHC: 33.8 g/dL (ref 30.0–36.0)
MCV: 84.2 fL (ref 78.0–100.0)
MONO ABS: 0.6 10*3/uL (ref 0.1–1.0)
MONOS PCT: 6 %
Neutro Abs: 8 10*3/uL — ABNORMAL HIGH (ref 1.7–7.7)
Neutrophils Relative %: 78 %
PLATELETS: 199 10*3/uL (ref 150–400)
RBC: 4.43 MIL/uL (ref 3.87–5.11)
RDW: 15.1 % (ref 11.5–15.5)
WBC: 10.2 10*3/uL (ref 4.0–10.5)

## 2016-06-10 LAB — URINALYSIS, ROUTINE W REFLEX MICROSCOPIC
Bilirubin Urine: NEGATIVE
GLUCOSE, UA: NEGATIVE mg/dL
HGB URINE DIPSTICK: NEGATIVE
KETONES UR: NEGATIVE mg/dL
LEUKOCYTES UA: NEGATIVE
Nitrite: NEGATIVE
PH: 7 (ref 5.0–8.0)
Protein, ur: NEGATIVE mg/dL
Specific Gravity, Urine: 1.009 (ref 1.005–1.030)

## 2016-06-10 LAB — I-STAT BETA HCG BLOOD, ED (MC, WL, AP ONLY)

## 2016-06-10 LAB — I-STAT CG4 LACTIC ACID, ED: LACTIC ACID, VENOUS: 1.56 mmol/L (ref 0.5–1.9)

## 2016-06-10 MED ORDER — ACETAMINOPHEN 325 MG PO TABS
ORAL_TABLET | ORAL | Status: AC
  Filled 2016-06-10: qty 2

## 2016-06-10 MED ORDER — SODIUM CHLORIDE 0.9 % IV BOLUS (SEPSIS)
1000.0000 mL | Freq: Once | INTRAVENOUS | Status: AC
  Administered 2016-06-10: 1000 mL via INTRAVENOUS

## 2016-06-10 MED ORDER — IBUPROFEN 400 MG PO TABS
600.0000 mg | ORAL_TABLET | Freq: Once | ORAL | Status: AC
  Administered 2016-06-10: 600 mg via ORAL
  Filled 2016-06-10: qty 1

## 2016-06-10 MED ORDER — ACETAMINOPHEN 325 MG PO TABS
650.0000 mg | ORAL_TABLET | Freq: Once | ORAL | Status: AC
  Administered 2016-06-10: 650 mg via ORAL

## 2016-06-10 NOTE — ED Triage Notes (Signed)
Pt reports to the ED for eval of nasal congestion, cough, body aches, and chills since Monday. Denies any N/V/D. She reports that on Monday she had a severe HA and she took her BP and noticed it was elevated and [facility] Los[facility]. She took one today around two but states she has remained hypertensive. She denies any pseudoephedrine use and she states she has been eating and drinking normally.

## 2016-06-10 NOTE — Telephone Encounter (Signed)
Pt was in today for fasting blood work.  Will have pt sign a release for records at her OV on 06/19/16.  Paula Lambert. Nelson, CMA

## 2016-06-10 NOTE — ED Notes (Signed)
Tolerating PO fluids and meds

## 2016-06-10 NOTE — ED Notes (Signed)
Back from xray

## 2016-06-10 NOTE — ED Notes (Signed)
Up to b/r for urine sample 

## 2016-06-10 NOTE — ED Notes (Addendum)
Pt alert, NAD, calm, interactive, resps e/u, steady gait from w/r to exam room, family at side, c/o cough, body aches, wheeze and fever, onset yesterday. (denies: nvd).

## 2016-06-10 NOTE — ED Notes (Signed)
EDNP at [facility] 

## 2016-06-10 NOTE — ED Provider Notes (Signed)
[facility]-EMERGENCY DEPT Provider Note   CSN: [id number] Arrival date & time: 06/10/16  2026     History   Chief Complaint Chief Complaint  Patient presents with  . Influenza    HPI Paula Lambert is a 45 y.o. female.  's a 45 year old female with a history of hypertension, who is been out of her medication for "a while".  She saw her primary care physician on Friday who did not restart blood pressure medicine, but it was a get to know you are appointment.  She had blood work drawn today for follow-up appointment.  She was seen at urgent care yesterday for hypertension started on [place] on.  She states she took one dose yesterday.  Mother does today, but still has hypertension today.  She also noticed that she started with fever, myalgias, sore throat, cough, wheezing      Past Medical History:  Diagnosis Date  . Allergy   . Asthma   . GERD (gastroesophageal reflux disease)   . Heart murmur    leaking valve  . Hypertension   . Sleep apnea     Patient Active Problem List   Diagnosis Date Noted  . Hypertension 06/05/2016  . Asthma 06/05/2016  . Sleep apnea 06/05/2016  . Heart murmur 06/05/2016  . GERD (gastroesophageal reflux disease) 06/05/2016  . Environmental and seasonal allergies 06/05/2016    Past Surgical History:  Procedure Laterality Date  . BREAST SURGERY     reduction  . LIPOMA EXCISION    . TENDON RELEASE Right    elbow  . TONSILLECTOMY      OB History    No data available       Home Medications    Prior to Admission medications   Medication Sig Start Date End Date Taking? Authorizing Provider  albuterol (PROVENTIL HFA;VENTOLIN HFA) 108 (90 Base) MCG/ACT inhaler Inhale 2 puffs into the lungs every 6 (six) hours as needed for wheezing or shortness of breath.   Yes Historical Provider, MD  losartan-hydrochlorothiazide (HYZAAR) 50-12.5 MG tablet Take 1 tablet by mouth daily.   Yes Historical Provider, MD  oseltamivir (TAMIFLU) 75 MG capsule Take 1  capsule (75 mg total) by mouth every 12 (twelve) hours. 06/11/16   Earley Favor, NP    Family History Family History  Problem Relation Age of Onset  . Aneurysm Mother   . Stroke Father   . Hypertension Father   . Prostate cancer Father   . Diabetes Father   . Aneurysm Maternal Uncle   . Stroke Maternal Grandmother   . Hypertension Maternal Grandmother   . Heart disease Maternal Grandmother   . Diabetes Maternal Grandmother   . Aneurysm Maternal Grandfather   . Stroke Paternal Grandmother     Social History Social History  Substance Use Topics  . Smoking status: Never Smoker  . Smokeless tobacco: Never Used  . Alcohol use Yes     Comment: occasionally     Allergies   Amoxicillin; Penicillins; Sulfa antibiotics; and Ultram [tramadol hcl]   Review of Systems Review of Systems  Constitutional: Positive for chills and fever.  HENT: Positive for sore throat.   Respiratory: Positive for cough, shortness of breath and wheezing.   Gastrointestinal: Negative for abdominal pain, nausea and vomiting.  Genitourinary: Negative for dysuria.  Musculoskeletal: Positive for myalgias.  Neurological: Positive for headaches.  All other systems reviewed and are negative.    Physical Exam Updated Vital Signs BP 129/93   Pulse 111   Temp 98.3  F (36.8 C) (Oral)   Resp 18   Ht 5' 1.5" (1.562 m)   Wt 85 kg   LMP 05/31/2016 (Exact Date)   SpO2 98%   BMI 34.83 kg/m   Physical Exam  Constitutional: She appears well-developed and well-nourished. No distress.  HENT:  Head: Normocephalic.  Right Ear: External ear normal.  Left Ear: External ear normal.  Mouth/Throat: Oropharynx is clear and moist.  Eyes: Pupils are equal, round, and reactive to light.  Neck: Normal range of motion.  Cardiovascular: Regular rhythm.  Tachycardia present.   Pulmonary/Chest: Effort normal. She has no wheezes. She exhibits no tenderness.  Abdominal: Soft.  Musculoskeletal: Normal range of motion.    Neurological: She is alert.  Skin: Skin is warm and dry. No rash noted.  Psychiatric: She has a normal mood and affect.  Nursing note and vitals reviewed.    ED Treatments / Results  Labs (all labs ordered are listed, but only abnormal results are displayed) Labs Reviewed  COMPREHENSIVE METABOLIC PANEL - Abnormal; Notable for the following:       Result Value   Chloride 99 (*)    Glucose, Bld 151 (*)    Creatinine, Ser 1.10 (*)    All other components within normal limits  CBC WITH DIFFERENTIAL/PLATELET - Abnormal; Notable for the following:    Neutro Abs 8.0 (*)    All other components within normal limits  URINALYSIS, ROUTINE W REFLEX MICROSCOPIC - Abnormal; Notable for the following:    Color, Urine STRAW (*)    All other components within normal limits  I-STAT CG4 LACTIC ACID, ED  I-STAT BETA HCG BLOOD, ED ([facility], [facility], [facility] ONLY)    EKG  EKG Interpretation  Date/Time:  Wednesday June 10 2016 20:32:56 EST Ventricular Rate:  143 PR Interval:  128 QRS Duration: 64 QT Interval:  282 QTC Calculation: 435 R Axis:   49 Text Interpretation:  Sinus tachycardia Nonspecific T wave abnormality Abnormal ECG Confirmed by HAVILAND MD, JULIE ([id number]) on 06/10/2016 10:53:08 PM       Radiology Dg Chest 2 View  Result Date: 06/10/2016 CLINICAL DATA:  Coughing and wheezing EXAM: CHEST  2 VIEW COMPARISON:  None. FINDINGS: The heart size and mediastinal contours are within normal limits. Both lungs are clear. The visualized skeletal structures are unremarkable. IMPRESSION: No active cardiopulmonary disease. Electronically Signed   By: Jasmine PangKim  Fujinaga M.D.   On: 06/10/2016 23:35    Procedures Procedures (including critical care time)  Medications Ordered in ED Medications  acetaminophen (TYLENOL) tablet 650 mg (650 mg Oral Given 06/10/16 2041)  sodium chloride 0.9 % bolus 1,000 mL (0 mLs Intravenous Stopped 06/11/16 0026)  ibuprofen (ADVIL,MOTRIN) tablet 600 mg (600 mg Oral Given  06/10/16 2346)  sodium chloride 0.9 % bolus 1,000 mL (0 mLs Intravenous Stopped 06/11/16 0133)  oseltamivir (TAMIFLU) capsule 75 mg (75 mg Oral Given 06/11/16 0027)     Initial Impression / Assessment and Plan / ED Course  I have reviewed the triage vital signs and the nursing notes.  Pertinent labs & imaging results that were available during my care of the patient were reviewed by me and considered in my medical decision making (see chart for details).  Clinical Course       Final Clinical Impressions(s) / ED Diagnoses   Final diagnoses:  Flu-like symptoms  Hypertension, unspecified type    New Prescriptions Discharge Medication List as of 06/11/2016 12:49 AM    START taking these medications   Details  oseltamivir (TAMIFLU) 75 MG capsule Take 1 capsule (75 mg total) by mouth every 12 (twelve) hours., Starting Thu 06/11/2016, Print         Earley FavorGail Miller [place], NP 06/11/16 [id number]    Jacalyn LefevreJulie Haviland, MD 06/21/16 [id number]

## 2016-06-10 NOTE — ED Notes (Signed)
To xray, alert, NAD, calm, no changes.

## 2016-06-11 LAB — CBC WITH DIFFERENTIAL/PLATELET
BASOS ABS: 0 {cells}/uL (ref 0–200)
BASOS PCT: 0 %
EOS PCT: 1 %
Eosinophils Absolute: 68 cells/uL (ref 15–500)
HCT: 38.9 % (ref 35.0–45.0)
HEMOGLOBIN: 12.5 g/dL (ref 11.7–15.5)
LYMPHS ABS: 2516 {cells}/uL (ref 850–3900)
Lymphocytes Relative: 37 %
MCH: 28.1 pg (ref 27.0–33.0)
MCHC: 32.1 g/dL (ref 32.0–36.0)
MCV: 87.4 fL (ref 80.0–100.0)
MONOS PCT: 7 %
MPV: 10.1 fL (ref 7.5–12.5)
Monocytes Absolute: 476 cells/uL (ref 200–950)
NEUTROS ABS: 3740 {cells}/uL (ref 1500–7800)
Neutrophils Relative %: 55 %
PLATELETS: 195 10*3/uL (ref 140–400)
RBC: 4.45 MIL/uL (ref 3.80–5.10)
RDW: 15.5 % — ABNORMAL HIGH (ref 11.0–15.0)
WBC: 6.8 10*3/uL (ref 3.8–10.8)

## 2016-06-11 LAB — COMPREHENSIVE METABOLIC PANEL
ALBUMIN: 4 g/dL (ref 3.6–5.1)
ALT: 14 U/L (ref 6–29)
AST: 15 U/L (ref 10–30)
Alkaline Phosphatase: 53 U/L (ref 33–115)
BILIRUBIN TOTAL: 0.7 mg/dL (ref 0.2–1.2)
BUN: 11 mg/dL (ref 7–25)
CO2: 30 mmol/L (ref 20–31)
CREATININE: 0.9 mg/dL (ref 0.50–1.10)
Calcium: 9 mg/dL (ref 8.6–10.2)
Chloride: 102 mmol/L (ref 98–110)
Glucose, Bld: 123 mg/dL — ABNORMAL HIGH (ref 65–99)
Potassium: 4.3 mmol/L (ref 3.5–5.3)
SODIUM: 140 mmol/L (ref 135–146)
TOTAL PROTEIN: 6.6 g/dL (ref 6.1–8.1)

## 2016-06-11 LAB — LIPID PANEL
CHOLESTEROL: 194 mg/dL (ref <200)
HDL: 61 mg/dL (ref >50)
LDL Cholesterol: 116 mg/dL — ABNORMAL HIGH (ref <100)
TRIGLYCERIDES: 86 mg/dL (ref <150)
Total CHOL/HDL Ratio: 3.2 Ratio (ref <5.0)
VLDL: 17 mg/dL (ref <30)

## 2016-06-11 LAB — VITAMIN D 25 HYDROXY (VIT D DEFICIENCY, FRACTURES): Vit D, 25-Hydroxy: 20 ng/mL — ABNORMAL LOW (ref 30–100)

## 2016-06-11 LAB — TSH: TSH: 2.65 m[IU]/L

## 2016-06-11 MED ORDER — OSELTAMIVIR PHOSPHATE 75 MG PO CAPS: 75.0000 mg | ORAL_CAPSULE | Freq: Two times a day (BID) | ORAL | 0 refills | Status: DC

## 2016-06-11 MED ORDER — OSELTAMIVIR PHOSPHATE 75 MG PO CAPS
75.0000 mg | ORAL_CAPSULE | ORAL | Status: AC
  Administered 2016-06-11: 75 mg via ORAL
  Filled 2016-06-11: qty 1

## 2016-06-11 MED ORDER — SODIUM CHLORIDE 0.9 % IV BOLUS (SEPSIS)
1000.0000 mL | Freq: Once | INTRAVENOUS | Status: AC
  Administered 2016-06-11: 1000 mL via INTRAVENOUS

## 2016-06-11 NOTE — Discharge Instructions (Signed)
You can safely take alternating doses of Tylenol, ibuprofen for any fever or myalgias.  Get plenty of rest, drink plenty of fluids, take your antihypertensive on a regular basis.  Follow-up with the PCP

## 2016-06-16 ENCOUNTER — Telehealth: Payer: Self-pay

## 2016-06-16 NOTE — Telephone Encounter (Signed)
Pt stated she is going to contact her insurance to see which one touch brand glucometer they will cover. Pt stated she will contact me once she is informed. Liborio NixonAmanda Taelyn Broecker CMA, RT

## 2016-06-16 NOTE — Telephone Encounter (Signed)
Pt stated that she wants to use a glucometer that her husband has. Pt does not know which glucometer her husband is giving her. Pt stated she will contact the office and let us know after she speaks with her husband. Liborio NixonAmanda Breann Losano CMA, RT

## 2016-06-17 ENCOUNTER — Telehealth: Payer: Self-pay | Admitting: Family Medicine

## 2016-06-17 ENCOUNTER — Other Ambulatory Visit: Payer: Self-pay

## 2016-06-17 DIAGNOSIS — R7303 Prediabetes: Secondary | ICD-10-CM

## 2016-06-17 MED ORDER — EZ-LETS LANCETS 28G MISC: 12 refills | Status: DC

## 2016-06-17 MED ORDER — GLUCOSE BLOOD VI STRP: ORAL_STRIP | 12 refills | Status: DC

## 2016-06-17 NOTE — Telephone Encounter (Signed)
Pt stated she wanted Contour Next EZ strips and Lancets. Rx ordered on 06/17/16 per Dr.Opalski Liborio NixonAmanda Marquize Seib CMA, RT

## 2016-06-17 NOTE — Telephone Encounter (Signed)
Patient called back and said they use the Contor Next EZ meter and she would need the lancets and strips that go with it

## 2016-06-17 NOTE — Telephone Encounter (Signed)
Rx Contor Next EZ strips and lancets sent to pharmacy per Dr. Sharee Holsterpalski. Liborio NixonAmanda Grissom CMA, RT

## 2016-06-19 ENCOUNTER — Ambulatory Visit (INDEPENDENT_AMBULATORY_CARE_PROVIDER_SITE_OTHER): Payer: 59 | Admitting: Family Medicine

## 2016-06-19 ENCOUNTER — Encounter: Payer: Self-pay | Admitting: Family Medicine

## 2016-06-19 VITALS — BP 129/76 | HR 82 | Ht 61.5 in | Wt 181.6 lb

## 2016-06-19 DIAGNOSIS — E669 Obesity, unspecified: Secondary | ICD-10-CM

## 2016-06-19 DIAGNOSIS — R7303 Prediabetes: Secondary | ICD-10-CM | POA: Diagnosis not present

## 2016-06-19 DIAGNOSIS — E559 Vitamin D deficiency, unspecified: Secondary | ICD-10-CM

## 2016-06-19 DIAGNOSIS — I10 Essential (primary) hypertension: Secondary | ICD-10-CM

## 2016-06-19 DIAGNOSIS — J3089 Other allergic rhinitis: Secondary | ICD-10-CM

## 2016-06-19 DIAGNOSIS — J4520 Mild intermittent asthma, uncomplicated: Secondary | ICD-10-CM

## 2016-06-19 DIAGNOSIS — E7800 Pure hypercholesterolemia, unspecified: Secondary | ICD-10-CM

## 2016-06-19 DIAGNOSIS — Z2821 Immunization not carried out because of patient refusal: Secondary | ICD-10-CM

## 2016-06-19 MED ORDER — VITAMIN D (ERGOCALCIFEROL) 1.25 MG (50000 UNIT) PO CAPS: 50000.0000 [IU] | ORAL_CAPSULE | ORAL | 0 refills | Status: DC

## 2016-06-19 MED ORDER — LOSARTAN POTASSIUM-HCTZ 50-12.5 MG PO TABS: 1.0000 | ORAL_TABLET | Freq: Every day | ORAL | 3 refills | Status: DC

## 2016-06-19 NOTE — Progress Notes (Signed)
Assessment and plan:  1. new onset Pre-diabetes   2. Essential hypertension   3. Vitamin D deficiency   4. Environmental and seasonal allergies   5. Mild intermittent asthma without complication   6. Elevated LDL cholesterol level   7. Vaccination not carried out because of patient refusal   8. Obesity, Class I, BMI 30-34.9     Vaccination not carried out because of patient refusal Declined Flu vaccine-    Asthma Has albuterol if needed.   Not really using now excpt occ at night with her cold sx.  Declines need for RF.  Sx stable    Elevated LDL cholesterol level Dietary changes such as low saturated & trans fat and low carb/ ketogenic diets discussed with patient.    Encouraged regular exercise and weight loss  Educational handouts provided at patient's desire.  Contact us prior with any Q's/ concerns.    Obesity, Class I, BMI 30-34.9 Discussed with patient importance of weight loss to help achieve health goals and how increasing weight, correlates to increasing risk of various diseases. (or increasing risk of not controlling existing diseases.)  F/up sooner than planned if you would like to further discuss strategies to lose weight  - Weight watchers recommended;     - Declines nutrition counseling with dietary specialist/ DM specialist at this time    Hypertension Lifestyle changes such as dash diet and engaging in a regular exercise program discussed with patient.  Educational handouts provided  Ambulatory BP monitoring encouraged. Keep log and bring in next OV.   Goal- 120's/70's or less.   Controlled.  Continue current medication(s).  Encouraged patient to read drug information handouts to further educate self about the medicine prior to starting it.   Contact us prior with any Q's/ concerns.    Pre-diabetes/ glucose intolerance  (new onset)  - Counseled patient on pathophysiology of  disease and discussed various treatment options, which often includes dietary and lifestyle modifications as first line.    - wt loss and Importance of low carb/ketogenic diet discussed with patient in addition to regular exercise.   - Check FBS and 2 hours after the biggest meal of your day.  Keep log and bring in next OV for my review.   Also, if you ever feel poorly, please check your blood pressure and blood sugar, as one or the other could be the cause of your symptoms.    Environmental and seasonal allergies Pt with residual sx of likely a viral URI. Supp care d/c pt.    Red flag sx of bacterial sinusitis d/c pt- she will stay vigilant  Used to take allergy shots.    Vitamin D deficiency Pt's Vitamin D level is too low.  I recommend pt take a once weekly dose of 50 K IUs. In addition to the once weekly prescription dose, pt needs to take an OTC daily dose of 5,000 IUs of vitamin D3.   New Prescriptions   VITAMIN D, ERGOCALCIFEROL, (DRISDOL) 50000 UNITS CAPS CAPSULE    Take 1 capsule (50,000 Units total) by mouth every 7 (seven) days.    Modified Medications   Modified Medication Previous Medication   LOSARTAN-HYDROCHLOROTHIAZIDE (HYZAAR) 50-12.5 MG TABLET losartan-hydrochlorothiazide (HYZAAR) 50-12.5 MG tablet      Take 1 tablet by mouth daily.    Take 1 tablet by mouth daily.    Return in about 4 months (around 10/18/2016). Sooner if BP not well controlled/ BS's etc  Anticipatory guidance and routine counseling done re: condition, txmnt options and need for follow up. All questions of patient's were answered.   Gross side effects, risk and benefits, and alternatives of medications discussed with patient.  Patient is aware that all medications have potential side effects and we are unable to predict every sideeffect or drug-drug interaction that may occur.  Expresses verbal understanding and consents to current therapy plan and treatment regiment.  Please see AVS handed  out to patient at the end of our visit for additional patient instructions/ counseling done pertaining to today's office visit.  Note: This document was prepared using Dragon voice recognition software and may include unintentional dictation errors.   ----------------------------------------------------------------------------------------------------------------------  Subjective:   CC:   Paula Lambert is a 45 y.o. female who presents to [place] at [facility] today for review and discussion of recent bloodwork that was done and reck her BP- as this is only the 2nd OV I've had with pt.      1. All recent blood work that we ordered was reviewed with patient today.   Pt is [place] Pre-Diabetes,  Vit D def, and elevated LDL.  She has not been checking her BS yet. We discussed this dx and implications and complications of it.   2. Patient was counseled on all abnormalities and we discussed dietary and lifestyle changes that could help those values (also medications when appropriate).  Extensive health counseling performed and all patient's concerns/ questions were addressed.   - pt had many concerns/ Q about her bldwrk, BP meds etc  3. HTN:  On December 18th patient went to medical urgent care on [facility] in [place] b/c we were closed and she was feeling "ill" and having HA's.   BP's were found to be elevated at this time as well.  They put her on Hyzaar 50-12 0.5 at that time.  Ever since, her blood pressures been running in the 120s over 70s and 80s on average- she brought in her BP log.  No HA, NO Sx  4. Had additional visit to the ED 2 days later for "flu-like" sx/  sinusitis.   No Flu test was obtained but pt was txed with tamiflu.  They obtained stat bldwrk - cbc, cmp, UA, Lactic Acid and hCG quant.  Pt states sx have not resolved since but are improving slwly.  Still having cough- W at night due to PND and sinus congestion. No SOB, WH, DIB. No one sided face  pain    Wt Readings from Last 3 Encounters:  06/19/16 181 lb 9.6 oz (82.4 kg)  06/10/16 187 lb 6 oz (85 kg)  06/05/16 183 lb (83 kg)   BP Readings from Last 3 Encounters:  06/19/16 129/76  06/11/16 129/93  06/05/16 (!) 143/84   Pulse Readings from Last 3 Encounters:  06/19/16 82  06/11/16 111  06/05/16 85   BMI Readings from Last 3 Encounters:  06/19/16 33.76 kg/m  06/10/16 34.83 kg/m  06/05/16 34.02 kg/m     Patient Care Team    Relationship Specialty Notifications Start End  Mellody Dance, DO PCP - General Family Medicine  06/05/16     Full medical history updated and reviewed in the office today   Patient Active Problem List   Diagnosis Date Noted  . Pre-diabetes/ glucose intolerance  (new onset)  06/20/2016    Priority: High  . Elevated LDL cholesterol level 06/20/2016    Priority: High  . Obesity, Class I,  BMI 30-34.9 06/20/2016    Priority: High  . Hypertension 06/05/2016    Priority: High  . Asthma 06/05/2016    Priority: Medium  . Sleep apnea 06/05/2016    Priority: Medium  . GERD (gastroesophageal reflux disease) 06/05/2016    Priority: Medium  . Vitamin D deficiency 06/19/2016    Priority: Low  . Environmental and seasonal allergies 06/05/2016    Priority: Low  . Vaccination not carried out because of patient refusal 06/20/2016  . Heart murmur 06/05/2016    Past Medical History:  Diagnosis Date  . Asthma   . GERD (gastroesophageal reflux disease)   . Heart murmur    leaking valve  . Hypertension   . Sleep apnea     Past Surgical History:  Procedure Laterality Date  . BREAST SURGERY     reduction  . LIPOMA EXCISION    . TENDON RELEASE Right    elbow  . TONSILLECTOMY      Social History  Substance Use Topics  . Smoking status: Never Smoker  . Smokeless tobacco: Never Used  . Alcohol use Yes     Comment: occasionally    Family Hx: Family History  Problem Relation Age of Onset  . Aneurysm Mother   . Stroke Father   .  Hypertension Father   . Prostate cancer Father   . Diabetes Father   . Aneurysm Maternal Uncle   . Stroke Maternal Grandmother   . Hypertension Maternal Grandmother   . Heart disease Maternal Grandmother   . Diabetes Maternal Grandmother   . Aneurysm Maternal Grandfather   . Stroke Paternal Grandmother     Medications: Current Outpatient Prescriptions  Medication Sig Dispense Refill  . albuterol (PROVENTIL HFA;VENTOLIN HFA) 108 (90 Base) MCG/ACT inhaler Inhale 2 puffs into the lungs every 6 (six) hours as needed for wheezing or shortness of breath.    . Cholecalciferol (VITAMIN D-3) 5000 units TABS Take 1 tablet by mouth daily.    Marland Kitchen EZ-LETS LANCETS 28G MISC Use 1 lancet to check fasting blood sugar every morning and then use 1 lancet for 2 hours after largest meal everyday. 200 each 12  . glucose blood (BAYER CONTOUR NEXT TEST) test strip Use 1 strip to check fasting blood sugar and then check again 2 hours after largest meal. 100 each 12  . losartan-hydrochlorothiazide (HYZAAR) 50-12.5 MG tablet Take 1 tablet by mouth daily. 90 tablet 3  . Multiple Vitamin (MULTIVITAMIN) tablet Take 1 tablet by mouth daily.    . Vitamin D, Ergocalciferol, (DRISDOL) 50000 units CAPS capsule Take 1 capsule (50,000 Units total) by mouth every 7 (seven) days. 12 capsule 0   No current facility-administered medications for this visit.     Allergies:  Allergies  Allergen Reactions  . Amoxicillin Swelling    hives  . Penicillins Swelling    hives  . Sulfa Antibiotics Hives  . Ultram [Tramadol Hcl] Other (See Comments)    syncope    ROS: Review of Systems  Constitutional: Positive for malaise/fatigue. Negative for chills and fever.  HENT: Positive for congestion and sore throat. Negative for ear discharge, ear pain and sinus pain.        Mild ST due to PND  Eyes: Positive for blurred vision. Negative for double vision, pain, discharge and redness.       Seeing ophthalmologist for this problem  next week  Respiratory: Positive for cough. Negative for sputum production, shortness of breath, wheezing and stridor.   Cardiovascular: Negative for  chest pain and palpitations.  Gastrointestinal: Negative for diarrhea, nausea and vomiting.  Genitourinary: Negative for dysuria, frequency and urgency.  Musculoskeletal: Negative for joint pain and myalgias.       Chronic jt pains  Skin: Negative for rash.  Neurological: Negative for dizziness and focal weakness.  Endo/Heme/Allergies: Positive for environmental allergies.   Objective:  Blood pressure 129/76, pulse 82, height 5' 1.5" (1.562 m), weight 181 lb 9.6 oz (82.4 kg), last menstrual period 05/31/2016. Body mass index is 33.76 kg/m. Gen:   Well NAD, A and O *3 HEENT:    Electra/AT, EOMI,  MMM, TM's- WNL's, OP- clr w mild erythema, NO LAD Lungs:   Normal work of breathing. CTA B/L ant/post and B/L, no Wh, rhonchi Heart:   RRR, S1, S2 WNL's, no MRG Exts:    warm, pink,  Brisk capillary refill, warm and well perfused.  Psych:    No HI/SI, judgement and insight good, Euthymic mood. Full Affect.      Recent Results (from the past 2160 hour(s))  CBC with Differential/Platelet     Status: Abnormal   Collection Time: 06/10/16  9:04 AM  Result Value Ref Range   WBC 6.8 3.8 - 10.8 K/uL   RBC 4.45 3.80 - 5.10 MIL/uL   Hemoglobin 12.5 11.7 - 15.5 g/dL   HCT 38.9 35.0 - 45.0 %   MCV 87.4 80.0 - 100.0 fL   MCH 28.1 27.0 - 33.0 pg   MCHC 32.1 32.0 - 36.0 g/dL   RDW 15.5 (H) 11.0 - 15.0 %   Platelets 195 140 - 400 K/uL   MPV 10.1 7.5 - 12.5 fL   Neutro Abs 3,740 1,500 - 7,800 cells/uL   Lymphs Abs 2,516 850 - 3,900 cells/uL   Monocytes Absolute 476 200 - 950 cells/uL   Eosinophils Absolute 68 15 - 500 cells/uL   Basophils Absolute 0 0 - 200 cells/uL   Neutrophils Relative % 55 %   Lymphocytes Relative 37 %   Monocytes Relative 7 %   Eosinophils Relative 1 %   Basophils Relative 0 %   Smear Review Criteria for review not met     Comprehensive metabolic panel     Status: Abnormal   Collection Time: 06/10/16  9:04 AM  Result Value Ref Range   Sodium 140 135 - 146 mmol/L   Potassium 4.3 3.5 - 5.3 mmol/L   Chloride 102 98 - 110 mmol/L   CO2 30 20 - 31 mmol/L   Glucose, Bld 123 (H) 65 - 99 mg/dL   BUN 11 7 - 25 mg/dL   Creat 0.90 0.50 - 1.10 mg/dL   Total Bilirubin 0.7 0.2 - 1.2 mg/dL   Alkaline Phosphatase 53 33 - 115 U/L   AST 15 10 - 30 U/L   ALT 14 6 - 29 U/L   Total Protein 6.6 6.1 - 8.1 g/dL   Albumin 4.0 3.6 - 5.1 g/dL   Calcium 9.0 8.6 - 10.2 mg/dL  Lipid panel     Status: Abnormal   Collection Time: 06/10/16  9:04 AM  Result Value Ref Range   Cholesterol 194 <200 mg/dL   Triglycerides 86 <150 mg/dL   HDL 61 >50 mg/dL   Total CHOL/HDL Ratio 3.2 <5.0 Ratio   VLDL 17 <30 mg/dL   LDL Cholesterol 116 (H) <100 mg/dL  VITAMIN D 25 Hydroxy (Vit-D Deficiency, Fractures)     Status: Abnormal   Collection Time: 06/10/16  9:04 AM  Result Value Ref Range  Vit D, 25-Hydroxy 20 (L) 30 - 100 ng/mL    Comment: Vitamin D Status           25-OH Vitamin D        Deficiency                <20 ng/mL        Insufficiency         20 - 29 ng/mL        Optimal             > or = 30 ng/mL   For 25-OH Vitamin D testing on patients on D2-supplementation and patients for whom quantitation of D2 and D3 fractions is required, the [facility] 25-OH VIT D, (D2,D3), LC/MS/MS is recommended: order code [id number] (patients > 2 yrs).   TSH     Status: None   Collection Time: 06/10/16  9:04 AM  Result Value Ref Range   TSH 2.65 mIU/L    Comment:   Reference Range   > or = 20 Years  0.40-4.50   Pregnancy Range First trimester  0.26-2.66 Second trimester 0.55-2.73 Third trimester  0.43-2.91     POCT glycosylated hemoglobin (Hb A1C)     Status: Abnormal   Collection Time: 06/10/16  9:35 AM  Result Value Ref Range   Hemoglobin A1C 6.3

## 2016-06-19 NOTE — Patient Instructions (Addendum)
For pre-diabetes/ glucose intolerance-  rreck a1c in 73mo or so.    - meditation for HA/ sleep   Prediabetes Eating Plan Prediabetes--also called impaired glucose tolerance or impaired fasting glucose--is a condition that causes blood sugar (blood glucose) levels to be higher than normal. Following a healthy diet can help to keep prediabetes under control. It can also help to lower the risk of type 2 diabetes and heart disease, which are increased in people who have prediabetes. Along with regular exercise, a healthy diet:  Promotes weight loss.  Helps to control blood sugar levels.  Helps to improve the way that the body uses insulin. WHAT DO I NEED TO KNOW ABOUT THIS EATING PLAN?  Use the glycemic index (GI) to plan your meals. The index tells you how quickly a food will raise your blood sugar. Choose low-GI foods. These foods take a longer time to raise blood sugar.  Pay close attention to the amount of carbohydrates in the food that you eat. Carbohydrates increase blood sugar levels.  Keep track of how many calories you take in. Eating the right amount of calories will help you to achieve a healthy weight. Losing about 7 percent of your starting weight can help to prevent type 2 diabetes.  You may want to follow a Mediterranean diet. This diet includes a lot of vegetables, lean meats or fish, whole grains, fruits, and healthy oils and fats. WHAT FOODS CAN I EAT? Grains Whole grains, such as whole-wheat or whole-grain breads, crackers, cereals, and pasta. Unsweetened oatmeal. Bulgur. Barley. Quinoa. Brown rice. Corn or whole-wheat flour tortillas or taco shells. Vegetables Lettuce. Spinach. Peas. Beets. Cauliflower. Cabbage. Broccoli. Carrots. Tomatoes. Squash. Eggplant. Herbs. Peppers. Onions. Cucumbers. Brussels sprouts. Fruits Berries. Bananas. Apples. Oranges. Grapes. Papaya. Mango. Pomegranate. Kiwi. Grapefruit. Cherries. Meats and Other Protein Sources Seafood. Lean meats, such  as chicken and [place] or lean cuts of pork and beef. Tofu. Eggs. Nuts. Beans. Dairy Low-fat or fat-free dairy products, such as yogurt, cottage cheese, and cheese. Beverages Water. Tea. Coffee. Sugar-free or diet soda. Seltzer water. Milk. Milk alternatives, such as soy or almond milk. Condiments Mustard. Relish. Low-fat, low-sugar ketchup. Low-fat, low-sugar barbecue sauce. Low-fat or fat-free mayonnaise. Sweets and Desserts Sugar-free or low-fat pudding. Sugar-free or low-fat ice cream and other frozen treats. Fats and Oils Avocado. Walnuts. Olive oil. The items listed above may not be a complete list of recommended foods or beverages. Contact your dietitian for more options.  WHAT FOODS ARE NOT RECOMMENDED? Grains Refined white flour and flour products, such as bread, pasta, snack foods, and cereals. Beverages Sweetened drinks, such as sweet iced tea and soda. Sweets and Desserts Baked goods, such as cake, cupcakes, pastries, cookies, and cheesecake. The items listed above may not be a complete list of foods and beverages to avoid. Contact your dietitian for more information.   This information is not intended to replace advice given to you by your health care provider. Make sure you discuss any questions you have with your health care provider.   Document Released: 10/23/2014 Document Reviewed: 10/23/2014 Elsevier Interactive Patient Education 2016 [employer].     Risk factors for prediabetes and type 2 diabetes  Researchers don't fully understand why some people develop prediabetes and type 2 diabetes and others don't.  It's clear that certain factors increase the risk, however, including:  Weight. The more fatty tissue you have, the more resistant your cells become to insulin.  Inactivity. The less active you are, the greater your risk.  Physical activity helps you control your weight, uses up glucose as energy and makes your cells more sensitive to insulin.  Family  history. Your risk increases if a parent or sibling has type 2 diabetes.  Race. Although it's unclear why, people of certain races - including blacks, Hispanics, American Indians and Asian-Americans - are at higher risk.  Age. Your risk increases as you get older. This may be because you tend to exercise less, lose muscle mass and gain weight as you age. But type 2 diabetes is also increasing dramatically among children, adolescents and younger adults.  Gestational diabetes. If you developed gestational diabetes when you were pregnant, your risk of developing prediabetes and type 2 diabetes later increases. If you gave birth to a baby weighing more than 9 pounds (4 kilograms), you're also at risk of type 2 diabetes.  Polycystic ovary syndrome. For women, having polycystic ovary syndrome - a common condition characterized by irregular menstrual periods, excess hair growth and obesity - increases the risk of diabetes.  High blood pressure. Having blood pressure over 140/90 millimeters of mercury (mm Hg) is linked to an increased risk of type 2 diabetes.  Abnormal cholesterol and triglyceride levels. If you have low levels of high-density lipoprotein (HDL), or "good," cholesterol, your risk of type 2 diabetes is higher. Triglycerides are another type of fat carried in the blood. People with high levels of triglycerides have an increased risk of type 2 diabetes. Your doctor can let you know what your cholesterol and triglyceride levels are.    A good guide to good carbs: The glycemic index ---If you have diabetes, or at risk for diabetes, you know all too well that when you eat carbohydrates, your blood sugar goes up. The total amount of carbs you consume at a meal or in a snack mostly determines what your blood sugar will do. But the food itself also plays a role. A serving of white rice has almost the same effect as eating pure table sugar - a quick, high spike in blood sugar. A serving of lentils has a  slower, smaller effect.  ---Picking good sources of carbs can help you control your blood sugar and your weight. Even if you don't have diabetes, eating healthier carbohydrate-rich foods can help ward off a host of chronic conditions, from heart disease to various cancers to, well, diabetes.  ---One way to choose foods is with the glycemic index (GI). This tool measures how much a food boosts blood sugar.  The glycemic index rates the effect of a specific amount of a food on blood sugar compared with the same amount of pure glucose. A food with a glycemic index of 28 boosts blood sugar only 28% as much as pure glucose. One with a GI of 95 acts like pure glucose.  High glycemic foods result in a quick spike in insulin and blood sugar (also known as blood glucose).  Low glycemic foods have a slower, smaller effect- these are healthier for you.   Using the glycemic index Using the glycemic index is easy: choose foods in the low GI category instead of those in the high GI category (see below), and go easy on those in between. Low glycemic index (GI of 55 or less): Most fruits and vegetables, beans, minimally processed grains, pasta, low-fat dairy foods, and nuts.  Moderate glycemic index (GI 56 to 69): White and sweet potatoes, corn, white rice, couscous, breakfast cereals such as Cream of Wheat and Mini Wheats.  High glycemic  index (GI of 70 or higher): White bread, rice cakes, most crackers, bagels, cakes, doughnuts, croissants, most packaged breakfast cereals. You can see the values for 100 commons foods and get links to more at www.health.[employer]/glycemic.  Swaps for lowering glycemic index  Instead of this high-glycemic index food Eat this lower-glycemic index food  White rice Brown rice or converted rice  Instant oatmeal Steel-cut oats  Cornflakes Bran flakes  Baked potato Pasta, bulgur  White bread Whole-grain bread  Corn Peas or leafy greens     Guidelines for Losing Weight   We  want weight loss that will last so you should lose 1-2 pounds a week.  THAT IS IT! Please pick THREE things a month to change. Once it is a habit check off the item. Then pick another three items off the list to become habits.  If you are already doing a habit on the list GREAT!  Cross that item off!  Don't drink your calories. Ie, alcohol, soda, fruit juice, and sweet tea.   Drink more water. Drink a glass when you feel hungry or before each meal.   Eat breakfast - Complex carb and protein (likeDannon light and fit yogurt, oatmeal, fruit, eggs, [place] bacon).  Measure your cereal.  Eat no more than one cup a day. (ie Kashi)  Eat an apple a day.  Add a vegetable a day.  Try a new vegetable a month.  Use Pam! Stop using oil or butter to cook.  Don't finish your plate or use smaller plates.  Share your dessert.  Eat sugar free Jello for dessert or frozen grapes.  Don't eat 2-3 hours before bed.  Switch to whole wheat bread, pasta, and brown rice.  Make healthier choices when you eat out. No fries!  Pick baked chicken, NOT fried.  Don't forget to SLOW DOWN when you eat. It is not going anywhere.   Take the stairs.  Park far away in the parking lot  Lift soup cans (or weights) for 10 minutes while watching TV.  Walk at work for 10 minutes during break.  Walk outside 1 time a week with your friend, kids, dog, or significant other.  Start a walking group at church.  Walk the mall as much as you can tolerate.   Keep a food diary.  Weigh yourself daily.  Walk for 15 minutes 3 days per week.  Cook at home more often and eat out less. If life happens and you go back to old habits, it is okay.  Just start over. You can do it!  If you experience chest pain, get short of breath, or tired during the exercise, please stop immediately and inform your doctor.    Before you even begin to attack a weight-loss plan, it pays to remember this: You are not fat. You have fat.  Losing weight isn't about blame or shame; it's simply another achievement to accomplish. Dieting is like any other skill-you have to buckle down and work at it. As long as you act in a smart, reasonable way, you'll ultimately get where you want to be. Here are some weight loss pearls for you.   1. It's Not a Diet. It's a Lifestyle Thinking of a diet as something you're on and suffering through only for the short term doesn't work. To shed weight and keep it off, you need to make permanent changes to the way you eat. It's OK to indulge occasionally, of course, but if you cut calories temporarily and  then revert to your old way of eating, you'll gain back the weight quicker than you can say yo-yo. Use it to lose it. Research shows that one of the best predictors of long-term weight loss is how many pounds you drop in the first month. For that reason, nutritionists often suggest being stricter for the first two weeks of your new eating strategy to build momentum. Cut out added sugar and alcohol and avoid unrefined carbs. After that, figure out how you can reincorporate them in a way that's healthy and maintainable.  2. There's a Right Way to Exercise Working out burns calories and fat and boosts your metabolism by building muscle. But those trying to lose weight are notorious for overestimating the number of calories they burn and underestimating the amount they take in. Unfortunately, your system is biologically programmed to hold on to extra pounds and that means when you start exercising, your body senses the deficit and ramps up its hunger signals. If you're not diligent, you'll eat everything you burn and then some. Use it, to lose it. Cardio gets all the exercise glory, but strength and interval training are the real heroes. They help you build lean muscle, which in turn increases your metabolism and calorie-burning ability 3. Don't Overreact to Mild Hunger Some people have a hard time losing weight  because of hunger anxiety. To them, being hungry is bad-something to be avoided at all costs-so they carry snacks with them and eat when they don't need to. Others eat because they're stressed out or bored. While you never want to get to the point of being ravenous (that's when bingeing is likely to happen), a hunger pang, a craving, or the fact that it's 3:00 p.m. should not send you racing for the vending machine or obsessing about the energy bar in your purse. Ideally, you should put off eating until your stomach is growling and it's difficult to concentrate.  Use it to lose it. When you feel the urge to eat, use the HALT method. Ask yourself, Am I really hungry? Or am I angry or anxious, lonely or bored, or tired? If you're still not certain, try the apple test. If you're truly hungry, an apple should seem delicious; if it doesn't, something else is going on. Or you can try drinking water and making yourself busy, if you are still hungry try a healthy snack.  4. Not All Calories Are Created Equal The mechanics of weight loss are pretty simple: Take in fewer calories than you use for energy. But the kind of food you eat makes all the difference. Processed food that's high in saturated fat and refined starch or sugar can cause inflammation that disrupts the hormone signals that tell your brain you're full. The result: You eat a lot more.  Use it to lose it. Clean up your diet. Swap in whole, unprocessed foods, including vegetables, lean protein, and healthy fats that will fill you up and give you the biggest nutritional bang for your calorie buck. In a few weeks, as your brain starts receiving regular hunger and fullness signals once again, you'll notice that you feel less hungry overall and naturally start cutting back on the amount you eat.  5. Protein, Produce, and Plant-Based Fats Are Your Weight-Loss Trinity Here's why eating the three Ps regularly will help you drop pounds. Protein fills you up. You  need it to build lean muscle, which keeps your metabolism humming so that you can torch more fat. People in a  weight-loss program who ate double the recommended daily allowance for protein (about 110 grams for a 150-pound woman) lost 70 percent of their weight from fat, while people who ate the RDA lost only about 40 percent, one study found. Produce is packed with filling fiber. "It's very difficult to consume too many calories if you're eating a lot of vegetables. Example: Three cups of broccoli is a lot of food, yet only 93 calories. (Fruit is another story. It can be easy to overeat and can contain a lot of calories from sugar, so be sure to monitor your intake.) Plant-based fats like olive oil and those in avocados and nuts are healthy and extra satiating.  Use it to lose it. Aim to incorporate each of the three Ps into every meal and snack. People who eat protein throughout the day are able to keep weight off, according to a study in the American Journal of Clinical Nutrition. In addition to meat, poultry and seafood, good sources are beans, lentils, eggs, tofu, and yogurt. As for fat, keep portion sizes in check by measuring out salad dressing, oil, and nut butters (shoot for one to two tablespoons). Finally, eat veggies or a little fruit at every meal. People who did that consumed 308 fewer calories but didn't feel any hungrier than when they didn't eat more produce.  7. How You Eat Is As Important As What You Eat In order for your brain to register that you're full, you need to focus on what you're eating. Sit down whenever you eat, preferably at a table. Turn off the TV or computer, put down your phone, and look at your food. Smell it. Chew slowly, and don't put another bite on your fork until you swallow. When women ate lunch this attentively, they consumed 30 percent less when snacking later than those who listened to an audiobook at lunchtime, according to a study in the [place] Journal of  Nutrition. 8. Weighing Yourself Really Works The scale provides the best evidence about whether your efforts are paying off. Seeing the numbers tick up or down or stagnate is motivation to keep going-or to rethink your approach. A 2015 study at [facility] found that daily weigh-ins helped people lose more weight, keep it off, and maintain that loss, even after two years. Use it to lose it. Step on the scale at the same time every day for the best results. If your weight shoots up several pounds from one weigh-in to the next, don't freak out. Eating a lot of salt the night before or having your period is the likely culprit. The number should return to normal in a day or two. It's a steady climb that you need to do something about. 9. Too Much Stress and Too Little Sleep Are Your Enemies When you're tired and frazzled, your body cranks up the production of cortisol, the stress hormone that can cause carb cravings. Not getting enough sleep also boosts your levels of ghrelin, a hormone associated with hunger, while suppressing leptin, a hormone that signals fullness and satiety. People on a diet who slept only five and a half hours a night for two weeks lost 55 percent less fat and were hungrier than those who slept eight and a half hours, according to a study in the [place] [employer] Journal. Use it to lose it. Prioritize sleep, aiming for seven hours or more a night, which research shows helps lower stress. And make sure you're getting quality zzz's. If a snoring spouse  or a fidgety cat wakes you up frequently throughout the night, you may end up getting the equivalent of just four hours of sleep, according to a study from [facility]. Keep pets out of the bedroom, and use a white-noise app to drown out snoring. 10. You Will Hit a plateau-And You Can Bust Through It As you slim down, your body releases much less leptin, the fullness hormone.  If you're not strength training, start  right now. Building muscle can raise your metabolism to help you overcome a plateau. To keep your body challenged and burning calories, incorporate new moves and more intense intervals into your workouts or add another sweat session to your weekly routine. Alternatively, cut an extra 100 calories or so a day from your diet. Now that you've lost weight, your body simply doesn't need as much fuel.      Since food equals calories, in order to lose weight you must either eat fewer calories, exercise more to burn off calories with activity, or both. Food that is not used to fuel the body is stored as fat. A major component of losing weight is to make smarter food choices. Here's how:  1)   Limit non-nutritious foods, such as: Sugar, honey, syrups and candy Pastries, donuts, pies, cakes and cookies Soft drinks, sweetened juices and alcoholic beverages  2)  Cut down on high-fat foods by: - Choosing poultry, fish or lean red meat - Choosing low-fat cooking methods, such as baking, broiling, steaming, grilling and boiling - Using low-fat or non-fat dairy products - Using vinaigrette, herbs, lemon or fat-free salad dressings - Avoiding fatty meats, such as bacon, sausage, franks, ribs and luncheon meats - Avoiding high-fat snacks like nuts, chips and chocolate - Avoiding fried foods - Using less butter, margarine, oil and mayonnaise - Avoiding high-fat gravies, cream sauces and cream-based soups  3) Eat a variety of foods, including: - Fruit and vegetables that are raw, steamed or baked - Whole grains, breads, cereal, rice and pasta - Dairy products, such as low-fat or non-fat milk or yogurt, low-fat cottage cheese and low-fat cheese - Protein-rich foods like chicken, [place], fish, lean meat and legumes, or beans  4) Change your eating habits by: - Eat three balanced meals a day to help control your hunger - Watch portion sizes and eat small servings of a variety of foods - Choose low-calorie  snacks - Eat only when you are hungry and stop when you are satisfied - Eat slowly and try not to perform other tasks while eating - Find other activities to distract you from food, such as walking, taking up a hobby or being involved in the community - Include regular exercise in your daily routine ( minimum of 20 min of moderate-intensity exercise at least 5 days/week)  - Find a support group, if necessary, for emotional support in your weight loss journey      Easy ways to cut 100 calories  1. Eat your eggs with hot sauce OR salsa instead of cheese.  Eggs are great for breakfast, but many people consider eggs and cheese to be BFFs. Instead of cheese-1 oz. of cheddar has 114 calories-top your eggs with hot sauce, which contains no calories and helps with satiety and metabolism. Salsa is also a great option!!  2. Top your toast, waffles or pancakes with fresh berries instead of jelly or syrup. Half a cup of berries-fresh, frozen or thawed-has about 40 calories, compared with 2 tbsp. of maple syrup or  jelly, which both have about 100 calories. The berries will also give you a good punch of fiber, which helps keep you full and satisfied and won't spike blood sugar quickly like the jelly or syrup. 3. Swap the non-fat latte for black coffee with a splash of half-and-half. Contrary to its name, that non-fat latte has 130 calories and a startling 19g of carbohydrates per 16 oz. serving. Replacing that 'light' drinkable dessert with a black coffee with a splash of half-and-half saves you more than 100 calories per 16 oz. serving. 4. Sprinkle salads with freeze-dried raspberries instead of dried cranberries. If you want a sweet addition to your nutritious salad, stay away from dried cranberries. They have a whopping 130 calories per  cup and 30g carbohydrates. Instead, sprinkle freeze-dried raspberries guilt-free and save more than 100 calories per  cup serving, adding 3g of belly-filling fiber. 5. Go  for mustard in place of mayo on your sandwich. Mustard can add really nice flavor to any sandwich, and there are tons of varieties, from spicy to honey. A serving of mayo is 95 calories, versus 10 calories in a serving of mustard.  Or try an avocado mayo spread: You can find the recipe few click this link: https://www.californiaavocado.com/recipes/recipe-container/[place]ifornia-avocado-mayo 6. Choose a DIY salad dressing instead of the store-bought kind. Mix Dijon or whole grain mustard with low-fat Kefir or red wine vinegar and garlic. 7. Use hummus as a spread instead of a dip. Use hummus as a spread on a high-fiber cracker or tortilla with a sandwich and save on calories without sacrificing taste. 8. Pick just one salad "accessory." Salad isn't automatically a calorie winner. It's easy to over-accessorize with toppings. Instead of topping your salad with nuts, avocado and cranberries (all three will clock in at 313 calories), just pick one. The next day, choose a different accessory, which will also keep your salad interesting. You don't wear all your jewelry every day, right? 9. Ditch the white pasta in favor of spaghetti squash. One cup of cooked spaghetti squash has about 40 calories, compared with traditional spaghetti, which comes with more than 200. Spaghetti squash is also nutrient-dense. It's a good source of fiber and Vitamins A and C, and it can be eaten just like you would eat pasta-with a great tomato sauce and [place] meatballs or with pesto, tofu and spinach, for example. 10. Dress up your chili, soups and stews with non-fat [place] yogurt instead of sour cream. Just a 'dollop' of sour cream can set you back 115 calories and a whopping 12g of fat-seven of which are of the artery-clogging variety. Added bonus: [place] yogurt is packed with muscle-building protein, calcium and B Vitamins. 11. Mash cauliflower instead of mashed potatoes. One cup of traditional mashed potatoes-in all their creamy  goodness-has more than 200 calories, compared to mashed cauliflower, which you can typically eat for less than 100 calories per 1 cup serving. Cauliflower is a great source of the antioxidant indole-3-carbinol (I3C), which may help reduce the risk of some cancers, like breast cancer. 12. Ditch the ice cream sundae in favor of a [place] yogurt parfait. Instead of a cup of ice cream or fro-yo for dessert, try 1 cup of nonfat Greek yogurt topped with fresh berries and a sprinkle of cacao nibs. Both toppings are packed with antioxidants, which can help reduce cellular inflammation and oxidative damage. And the comparison is a no-brainer: One cup of ice cream has about 275 calories; one cup of frozen yogurt has about 230; and  a cup of Greek yogurt has just 130, plus twice the protein, so you're less likely to return to the freezer for a second helping. 13. Put olive oil in a spray container instead of using it directly from the bottle. Each tablespoon of olive oil is 120 calories and 15g of fat. Use a mister instead of pouring it straight into the pan or onto a salad. This allows for portion control and will save you more than 100 calories. 14. When baking, substitute canned pumpkin for butter or oil. Canned pumpkin-not pumpkin pie mix-is loaded with Vitamin A, which is important for skin and eye health, as well as immunity. And the comparisons are pretty crazy:  cup of canned pumpkin has about 40 calories, compared to butter or oil, which has more than 800 calories. Yes, 800 calories. Applesauce and mashed banana can also serve as good substitutions for butter or oil, usually in a 1:1 ratio. 15. Top casseroles with high-fiber cereal instead of breadcrumbs. Breadcrumbs are typically made with white bread, while breakfast cereals contain 5-9g of fiber per serving. Not only will you save more than 150 calories per  cup serving, the swap will also keep you more full and you'll get a metabolism boost from the added  fiber. 16. Snack on pistachios instead of macadamia nuts. Believe it or not, you get the same amount of calories from 35 pistachios (100 calories) as you would from only five macadamia nuts. 17. Chow down on kale chips rather than potato chips. This is my favorite 'don't knock it 'till you try it' swap. Kale chips are so easy to make at home, and you can spice them up with a little grated parmesan or chili powder. Plus, they're a mere fraction of the calories of potato chips, but with the same crunch factor we crave so often. 18. Add seltzer and some fruit slices to your cocktail instead of soda or fruit juice. One cup of soda or fruit juice can pack on as much as 140 calories. Instead, use seltzer and fruit slices. The fruit provides valuable phytochemicals, such as flavonoids and anthocyanins, which help to combat cancer and stave off the aging process.

## 2016-06-20 ENCOUNTER — Encounter: Payer: Self-pay | Admitting: Family Medicine

## 2016-06-20 DIAGNOSIS — Z2821 Immunization not carried out because of patient refusal: Secondary | ICD-10-CM | POA: Insufficient documentation

## 2016-06-20 NOTE — Assessment & Plan Note (Addendum)
Lifestyle changes such as dash diet and engaging in a regular exercise program discussed with patient.  Educational handouts provided  Ambulatory BP monitoring encouraged. Keep log and bring in next OV.   Goal- 120's/70's or less.   Controlled.  Continue current medication(s).  Encouraged patient to read drug information handouts to further educate self about the medicine prior to starting it.   Contact us prior with any Q's/ concerns.

## 2016-06-20 NOTE — Assessment & Plan Note (Signed)
Has albuterol if needed.   Not really using now excpt occ at night with her cold sx.  Declines need for RF.  Sx stable

## 2016-06-20 NOTE — Assessment & Plan Note (Signed)
Declined Flu vaccine

## 2016-06-20 NOTE — Assessment & Plan Note (Signed)
Pt's Vitamin D level is too low.  I recommend pt take a once weekly dose of 50 K IUs.   In addition to the once weekly prescription dose, pt needs to take an OTC daily dose of 5,000 IUs of vitamin D3.  

## 2016-06-20 NOTE — Assessment & Plan Note (Addendum)
-   Counseled patient on pathophysiology of disease and discussed various treatment options, which often includes dietary and lifestyle modifications as first line.    - wt loss and Importance of low carb/ketogenic diet discussed with patient in addition to regular exercise.   - Check FBS and 2 hours after the biggest meal of your day.  Keep log and bring in next OV for my review.   Also, if you ever feel poorly, please check your blood pressure and blood sugar, as one or the other could be the cause of your symptoms.

## 2016-06-20 NOTE — Assessment & Plan Note (Signed)
Pt with residual sx of likely a viral URI. Supp care d/c pt.    Red flag sx of bacterial sinusitis d/c pt- she will stay vigilant  Used to take allergy shots.

## 2016-06-20 NOTE — Assessment & Plan Note (Signed)
Dietary changes such as low saturated & trans fat and low carb/ ketogenic diets discussed with patient.    Encouraged regular exercise and weight loss   Educational handouts provided at patient's desire.  Contact us prior with any Q's/ concerns. 

## 2016-06-20 NOTE — Assessment & Plan Note (Addendum)
Discussed with patient importance of weight loss to help achieve health goals and how increasing weight, correlates to increasing risk of various diseases. (or increasing risk of not controlling existing diseases.)  F/up sooner than planned if you would like to further discuss strategies to lose weight  - Weight watchers recommended;     - Declines nutrition counseling with dietary specialist/ DM specialist at this time

## 2016-07-09 DIAGNOSIS — J209 Acute bronchitis, unspecified: Secondary | ICD-10-CM | POA: Diagnosis not present

## 2016-07-09 DIAGNOSIS — R062 Wheezing: Secondary | ICD-10-CM | POA: Diagnosis not present

## 2016-07-09 DIAGNOSIS — Z01419 Encounter for gynecological examination (general) (routine) without abnormal findings: Secondary | ICD-10-CM | POA: Insufficient documentation

## 2016-07-09 NOTE — Assessment & Plan Note (Signed)
Continue on ranitidine as symptoms well-controlled on this.  Diet modifications discussed.  Weight loss encouraged.

## 2016-07-09 NOTE — Assessment & Plan Note (Addendum)
Body mass index is 34.02 kg/m.   Explained to patient what BMI refers to, and what it means medically.    Told patient to think about it as a "medical risk stratification measurement" and how increasing BMI is associated with increasing risk/ or worsening state of various diseases such as hypertension, hyperlipidemia, diabetes, premature OA, depression etc.  American Heart Association guidelines for healthy diet, basically Mediterranean diet, and exercise guidelines of 30 minutes 5 days per week or more discussed in detail.  Health counseling performed.  All questions answered.  Handouts given

## 2016-07-09 NOTE — Assessment & Plan Note (Addendum)
  Patient will come in in the near future for fasting blood work.    We will do routine labs.   Continue Current medications for now: BP: Triamterene-hydrochlorothiazide 75-50 milligrams, 1 by mouth daily Losartan 25 mg,  1 by mouth daily Potassium chloride; (K- Dur) 10 mEq daily  Environmental allergies: Singulair 10 mg, 1 by mouth daily at bedtime Clarinex 5 mg, 1 by mouth daily Proventil, 2 puffs every 6 hours when necessary  GERD: Ranitidine 150 mg, 1 by mouth twice a day The patient was counseled, risk factors were discussed, anticipatory guidance given.

## 2016-07-10 ENCOUNTER — Telehealth: Payer: Self-pay

## 2016-07-10 NOTE — Telephone Encounter (Signed)
Pt called stating that she is having "extreme shortness of breath", cough, lightheadedness and dizziness.  She states that she is having to use her inhaler 4-6 times a day.  She states that [facility] and given a breathing treatment, but no IM steroids.  She states that the MD did send in a RX for oral steroids, but she did not pick them up and start them last night.  Pt does not have anyone to drive her ER, therefore advised pt to call EMS for transport to ER.  Pt expressed understanding and is agreeable.  Tiajuana Amass. Cerena Baine, CMA

## 2016-07-14 ENCOUNTER — Telehealth: Payer: Self-pay | Admitting: Family Medicine

## 2016-07-14 DIAGNOSIS — R7303 Prediabetes: Secondary | ICD-10-CM

## 2016-07-14 NOTE — Telephone Encounter (Signed)
Advised pt to please check with insurance company to see which meter and strips they will cover and call me back with info so that I may send in RX for a new testing device and test strips.  Pt expressed understanding and is agreeable.  Tiajuana Amass. Nelson, CMA

## 2016-07-14 NOTE — Telephone Encounter (Signed)
Patient states that her insurance no longer covers the Contor Next EZ glucose strips and was wanting to talk to someone clinical to see if there is a generic version that can be ordered for her

## 2016-07-16 MED ORDER — ONETOUCH ULTRA SYSTEM W/DEVICE KIT: 1.0000 | PACK | Freq: Once | 0 refills | Status: AC

## 2016-07-16 MED ORDER — GLUCOSE BLOOD VI STRP: ORAL_STRIP | 12 refills | Status: DC

## 2016-07-16 MED ORDER — ONETOUCH ULTRASOFT LANCETS MISC: 12 refills | Status: DC

## 2016-07-16 NOTE — Telephone Encounter (Signed)
Pt called back stating that her insurance will cover any of the One Touch meters.  RXs for meter, strips, and lancets sent to [facility] [address]  Tiajuana Amass. Nelson, CMA

## 2016-07-16 NOTE — Telephone Encounter (Signed)
LVM for pt to call with meter information.  Paula Lambert. Rosmery Duggin, CMA

## 2016-07-16 NOTE — Addendum Note (Signed)
Addended by: Stan HeadNELSON, Letasha Kershaw S on: 07/16/2016 10:26 AM   Modules accepted: Orders

## 2016-08-21 DIAGNOSIS — J45901 Unspecified asthma with (acute) exacerbation: Secondary | ICD-10-CM | POA: Diagnosis not present

## 2016-09-14 ENCOUNTER — Ambulatory Visit (INDEPENDENT_AMBULATORY_CARE_PROVIDER_SITE_OTHER): Payer: 59

## 2016-09-14 ENCOUNTER — Inpatient Hospital Stay (HOSPITAL_COMMUNITY)
Admission: EM | Admit: 2016-09-14 | Discharge: 2016-09-17 | DRG: 203 | Disposition: A | Discharge status: Home / Self Care | Payer: 59 | Attending: Internal Medicine | Admitting: Internal Medicine

## 2016-09-14 ENCOUNTER — Emergency Department (HOSPITAL_COMMUNITY): Payer: 59

## 2016-09-14 ENCOUNTER — Ambulatory Visit (HOSPITAL_COMMUNITY)
Admission: EM | Admit: 2016-09-14 | Discharge: 2016-09-14 | Disposition: A | Discharge status: Home / Self Care | Payer: 59 | Attending: Internal Medicine | Admitting: Internal Medicine

## 2016-09-14 ENCOUNTER — Encounter (HOSPITAL_COMMUNITY): Payer: Self-pay | Admitting: Emergency Medicine

## 2016-09-14 ENCOUNTER — Other Ambulatory Visit: Payer: Self-pay | Admitting: Family Medicine

## 2016-09-14 ENCOUNTER — Encounter (HOSPITAL_COMMUNITY): Payer: Self-pay | Admitting: *Deleted

## 2016-09-14 DIAGNOSIS — J4551 Severe persistent asthma with (acute) exacerbation: Secondary | ICD-10-CM

## 2016-09-14 DIAGNOSIS — E876 Hypokalemia: Secondary | ICD-10-CM | POA: Diagnosis present

## 2016-09-14 DIAGNOSIS — I10 Essential (primary) hypertension: Secondary | ICD-10-CM | POA: Diagnosis not present

## 2016-09-14 DIAGNOSIS — T8181XA Complication of inhalation therapy, initial encounter: Secondary | ICD-10-CM | POA: Diagnosis present

## 2016-09-14 DIAGNOSIS — Z91018 Allergy to other foods: Secondary | ICD-10-CM

## 2016-09-14 DIAGNOSIS — Z91010 Allergy to peanuts: Secondary | ICD-10-CM

## 2016-09-14 DIAGNOSIS — G4730 Sleep apnea, unspecified: Secondary | ICD-10-CM | POA: Diagnosis present

## 2016-09-14 DIAGNOSIS — Y848 Other medical procedures as the cause of abnormal reaction of the patient, or of later complication, without mention of misadventure at the time of the procedure: Secondary | ICD-10-CM | POA: Diagnosis present

## 2016-09-14 DIAGNOSIS — R05 Cough: Secondary | ICD-10-CM | POA: Diagnosis not present

## 2016-09-14 DIAGNOSIS — Z79899 Other long term (current) drug therapy: Secondary | ICD-10-CM

## 2016-09-14 DIAGNOSIS — R000 Tachycardia, unspecified: Secondary | ICD-10-CM | POA: Diagnosis present

## 2016-09-14 DIAGNOSIS — J45901 Unspecified asthma with (acute) exacerbation: Secondary | ICD-10-CM | POA: Diagnosis present

## 2016-09-14 DIAGNOSIS — Z885 Allergy status to narcotic agent status: Secondary | ICD-10-CM

## 2016-09-14 DIAGNOSIS — Z882 Allergy status to sulfonamides status: Secondary | ICD-10-CM

## 2016-09-14 DIAGNOSIS — J4541 Moderate persistent asthma with (acute) exacerbation: Secondary | ICD-10-CM

## 2016-09-14 DIAGNOSIS — Z880 Allergy status to penicillin: Secondary | ICD-10-CM

## 2016-09-14 DIAGNOSIS — T788XXA Other adverse effects, not elsewhere classified, initial encounter: Secondary | ICD-10-CM | POA: Diagnosis present

## 2016-09-14 DIAGNOSIS — E559 Vitamin D deficiency, unspecified: Secondary | ICD-10-CM

## 2016-09-14 LAB — CBC
HCT: 36.5 % (ref 36.0–46.0)
Hemoglobin: 12.3 g/dL (ref 12.0–15.0)
MCH: 28.3 pg (ref 26.0–34.0)
MCHC: 33.7 g/dL (ref 30.0–36.0)
MCV: 84.1 fL (ref 78.0–100.0)
Platelets: 248 10*3/uL (ref 150–400)
RBC: 4.34 MIL/uL (ref 3.87–5.11)
RDW: 14.2 % (ref 11.5–15.5)
WBC: 9.7 10*3/uL (ref 4.0–10.5)

## 2016-09-14 LAB — BASIC METABOLIC PANEL
Anion gap: 13 (ref 5–15)
CALCIUM: 9.7 mg/dL (ref 8.9–10.3)
CHLORIDE: 100 mmol/L — AB (ref 101–111)
CO2: 26 mmol/L (ref 22–32)
CREATININE: 0.89 mg/dL (ref 0.44–1.00)
GFR calc non Af Amer: >60 mL/min (ref >60)
GLUCOSE: 121 mg/dL — AB (ref 65–99)
Potassium: 3.1 mmol/L — ABNORMAL LOW (ref 3.5–5.1)
Sodium: 139 mmol/L (ref 135–145)

## 2016-09-14 MED ORDER — LOSARTAN POTASSIUM-HCTZ 50-12.5 MG PO TABS: 1.0000 | ORAL_TABLET | Freq: Every day | ORAL | Status: DC

## 2016-09-14 MED ORDER — ONDANSETRON HCL 4 MG PO TABS: 4.0000 mg | ORAL_TABLET | Freq: Four times a day (QID) | ORAL | Status: DC | PRN

## 2016-09-14 MED ORDER — PREDNISONE 50 MG PO TABS
60.0000 mg | ORAL_TABLET | Freq: Every day | ORAL | Status: DC
  Administered 2016-09-15: 60 mg via ORAL
  Filled 2016-09-14: qty 1

## 2016-09-14 MED ORDER — IPRATROPIUM-ALBUTEROL 0.5-2.5 (3) MG/3ML IN SOLN
3.0000 mL | Freq: Once | RESPIRATORY_TRACT | Status: AC
  Administered 2016-09-14: 3 mL via RESPIRATORY_TRACT

## 2016-09-14 MED ORDER — ENOXAPARIN SODIUM 40 MG/0.4ML ~~LOC~~ SOLN
40.0000 mg | SUBCUTANEOUS | Status: DC
  Administered 2016-09-14 – 2016-09-16 (×3): 40 mg via SUBCUTANEOUS
  Filled 2016-09-14 (×3): qty 0.4

## 2016-09-14 MED ORDER — ACETAMINOPHEN 650 MG RE SUPP: 650.0000 mg | Freq: Four times a day (QID) | RECTAL | Status: DC | PRN

## 2016-09-14 MED ORDER — ONDANSETRON HCL 4 MG/2ML IJ SOLN: 4.0000 mg | Freq: Four times a day (QID) | INTRAMUSCULAR | Status: DC | PRN

## 2016-09-14 MED ORDER — ALBUTEROL (5 MG/ML) CONTINUOUS INHALATION SOLN
10.0000 mg/h | INHALATION_SOLUTION | Freq: Once | RESPIRATORY_TRACT | Status: AC
  Administered 2016-09-14: 10 mg/h via RESPIRATORY_TRACT
  Filled 2016-09-14: qty 20

## 2016-09-14 MED ORDER — IPRATROPIUM-ALBUTEROL 0.5-2.5 (3) MG/3ML IN SOLN
3.0000 mL | RESPIRATORY_TRACT | Status: DC | PRN
  Administered 2016-09-15: 3 mL via RESPIRATORY_TRACT
  Filled 2016-09-14: qty 3

## 2016-09-14 MED ORDER — SODIUM CHLORIDE 0.9 % IV BOLUS (SEPSIS)
1000.0000 mL | Freq: Once | INTRAVENOUS | Status: AC
  Administered 2016-09-14: 1000 mL via INTRAVENOUS

## 2016-09-14 MED ORDER — METHYLPREDNISOLONE ACETATE 80 MG/ML IJ SUSP
80.0000 mg | Freq: Once | INTRAMUSCULAR | Status: AC
  Administered 2016-09-14: 80 mg via INTRAMUSCULAR

## 2016-09-14 MED ORDER — HYDROCHLOROTHIAZIDE 12.5 MG PO CAPS
12.5000 mg | ORAL_CAPSULE | Freq: Every day | ORAL | Status: DC
  Administered 2016-09-15 – 2016-09-17 (×3): 12.5 mg via ORAL
  Filled 2016-09-14 (×3): qty 1

## 2016-09-14 MED ORDER — IPRATROPIUM-ALBUTEROL 0.5-2.5 (3) MG/3ML IN SOLN
RESPIRATORY_TRACT | Status: AC
  Filled 2016-09-14: qty 3

## 2016-09-14 MED ORDER — LORATADINE 10 MG PO TABS
10.0000 mg | ORAL_TABLET | Freq: Every day | ORAL | Status: DC
  Administered 2016-09-15 – 2016-09-17 (×3): 10 mg via ORAL
  Filled 2016-09-14 (×3): qty 1

## 2016-09-14 MED ORDER — POTASSIUM CHLORIDE CRYS ER 20 MEQ PO TBCR
40.0000 meq | EXTENDED_RELEASE_TABLET | Freq: Once | ORAL | Status: AC
  Administered 2016-09-14: 40 meq via ORAL
  Filled 2016-09-14: qty 2

## 2016-09-14 MED ORDER — ACETAMINOPHEN 325 MG PO TABS
650.0000 mg | ORAL_TABLET | Freq: Four times a day (QID) | ORAL | Status: DC | PRN
  Administered 2016-09-14: 650 mg via ORAL
  Filled 2016-09-14: qty 2

## 2016-09-14 MED ORDER — LOSARTAN POTASSIUM 50 MG PO TABS
50.0000 mg | ORAL_TABLET | Freq: Every day | ORAL | Status: DC
  Administered 2016-09-15 – 2016-09-17 (×3): 50 mg via ORAL
  Filled 2016-09-14 (×3): qty 1

## 2016-09-14 MED ORDER — IPRATROPIUM-ALBUTEROL 0.5-2.5 (3) MG/3ML IN SOLN
3.0000 mL | Freq: Four times a day (QID) | RESPIRATORY_TRACT | Status: DC
  Administered 2016-09-15 (×2): 3 mL via RESPIRATORY_TRACT
  Filled 2016-09-14 (×2): qty 3

## 2016-09-14 MED ORDER — BECLOMETHASONE DIPROPIONATE 80 MCG/ACT IN AERS: 1.0000 | INHALATION_SPRAY | Freq: Two times a day (BID) | RESPIRATORY_TRACT | 3 refills | Status: DC

## 2016-09-14 MED ORDER — GUAIFENESIN ER 600 MG PO TB12
600.0000 mg | ORAL_TABLET | Freq: Two times a day (BID) | ORAL | Status: DC
  Administered 2016-09-14: 600 mg via ORAL
  Filled 2016-09-14: qty 1

## 2016-09-14 MED ORDER — MAGNESIUM SULFATE 2 GM/50ML IV SOLN
2.0000 g | Freq: Once | INTRAVENOUS | Status: AC
Start: 2016-09-14 — End: 2016-09-14
  Administered 2016-09-14: 2 g via INTRAVENOUS
  Filled 2016-09-14: qty 50

## 2016-09-14 MED ORDER — METHYLPREDNISOLONE ACETATE 80 MG/ML IJ SUSP
INTRAMUSCULAR | Status: AC
  Filled 2016-09-14: qty 1

## 2016-09-14 MED ORDER — BENZONATATE 100 MG PO CAPS: 100.0000 mg | ORAL_CAPSULE | Freq: Three times a day (TID) | ORAL | 0 refills | Status: DC

## 2016-09-14 MED ORDER — PREDNISONE 50 MG PO TABS: ORAL_TABLET | ORAL | 0 refills | Status: DC

## 2016-09-14 MED ORDER — AZITHROMYCIN 250 MG PO TABS: 250.0000 mg | ORAL_TABLET | Freq: Every day | ORAL | 0 refills | Status: DC

## 2016-09-14 NOTE — H&P (Signed)
History and Physical    [name] RUE:[id number] DOB: December 31, 1970 DOA: 09/14/2016  Referring MD/NP/PA: Rikki Spearing PCP: Thomasene Lot, DO  Patient coming from: Urgent Care  Chief Complaint: Shortness of breath  HPI: Paula Lambert is a 46 y.o. female with medical history significant of HTN and asthma; who presents with complaints of shortness of breath for the last 3-4 days.  Symptoms initially started with sore throat and sinus congestion. Now reporting productive cough with clear sputum and wheezing. Coughing spells seem to make symptoms worse. Reports utilizing her home inhaler multiple times daily as well as TheraFlu without relief of symptoms. Patient reports difficult control asthma over the last couple months which she's had repeated urgent care visits, but denies ever requiring hospitalization. Denies having any fever, muscle aches, chest pain, recent travel, calf pain, abdominal pain, nausea, vomiting, or dysuria.  She initially went to urgent care where she received 2 breathing treatments and 125 mg of Solu-Medrol without relief of symptoms. Patient was therefore sent to the ED for further evaluation.  ED Course:  Upon admission into the emergency department patient was seen to be afebrile, pulse of 133, respirations of 25, blood pressure 197/91, and O2 saturations maintained on room air. Lab work revealed potassium of 3.1, but was otherwise unremarkable. Chest x-ray was noted to be clear. Patient received 1 hour continuous neb of albuterol, 2 g of magnesium sulfate, potassium chloride 40 mEq, and 1 L normal saline IV fluids. [facility] called to admit.   Review of Systems: As per HPI otherwise 10 point review of systems negative.   Past Medical History:  Diagnosis Date  . Asthma   . GERD (gastroesophageal reflux disease)   . Heart murmur    leaking valve  . Hypertension   . Sleep apnea     Past Surgical History:  Procedure Laterality Date  . BREAST SURGERY     reduction  .  LIPOMA EXCISION    . TENDON RELEASE Right    elbow  . TONSILLECTOMY       reports that she has never smoked. She has never used smokeless tobacco. She reports that she drinks alcohol. She reports that she does not use drugs.  Allergies  Allergen Reactions  . Ultram [Tramadol Hcl] Other (See Comments)    syncope  . Amoxicillin Hives and Swelling  . Penicillins Hives and Swelling  . Sulfa Antibiotics Hives  . Other Other (See Comments)    PEANUTS AND ALMONDS    Family History  Problem Relation Age of Onset  . Aneurysm Mother   . Stroke Father   . Hypertension Father   . Prostate cancer Father   . Diabetes Father   . Aneurysm Maternal Uncle   . Stroke Maternal Grandmother   . Hypertension Maternal Grandmother   . Heart disease Maternal Grandmother   . Diabetes Maternal Grandmother   . Aneurysm Maternal Grandfather   . Stroke Paternal Grandmother     Prior to Admission medications   Medication Sig Start Date End Date Taking? Authorizing Provider  albuterol (PROVENTIL HFA;VENTOLIN HFA) 108 (90 Base) MCG/ACT inhaler Inhale 2 puffs into the lungs every 6 (six) hours as needed for wheezing or shortness of breath.   Yes Historical Provider, MD  Cholecalciferol (VITAMIN D-3) 5000 units TABS Take 1 tablet by mouth daily.   Yes Historical Provider, MD  losartan-hydrochlorothiazide (HYZAAR) 50-12.5 MG tablet Take 1 tablet by mouth daily. 06/19/16  Yes Thomasene Lot, DO  Vitamin D, Ergocalciferol, (DRISDOL) 50000 units CAPS  capsule Take 50,000 Units by mouth once a week. 09/14/16  Yes Historical Provider, MD    Physical Exam:    Constitutional: Middle-aged female who appears to be in mild distress.  Vitals:   09/14/16 1800 09/14/16 1805 09/14/16 1830 09/14/16 1915  BP: 131/82  (!) 132/95 137/73  Pulse: (!) 111  (!) 126 (!) 133  Resp: (!) 24  (!) 23   Temp:      TempSrc:      SpO2: 96% 100% 100% 98%  Weight:      Height:       Eyes: PERRL, lids and conjunctivae  normal ENMT: Mucous membranes are moist. Posterior pharynx clear of any exudate or lesions.Normal dentition.  Neck: normal, supple, no masses, no thyromegaly Respiratory: Tachypneic with bilateral expiratory wheezes. Patient able to talk and nearly complete sentences.  Cardiovascular: Tachycardic, no murmurs / rubs / gallops. Trace lower extremity edema. 2+ pedal pulses. No carotid bruits.  Abdomen: no tenderness, no masses palpated. No hepatosplenomegaly. Bowel sounds positive.  Musculoskeletal: no clubbing / cyanosis. No joint deformity upper and lower extremities. Good ROM, no contractures. Normal muscle tone.  Skin: no rashes, lesions, ulcers. No induration Neurologic: CN 2-12 grossly intact. Sensation intact, DTR normal. Strength 5/5 in all 4.  Psychiatric: Normal judgment and insight. Alert and oriented x 3. Normal mood.     Labs on Admission: I have personally reviewed following labs and imaging studies  CBC:  Recent Labs Lab 09/14/16 1754  WBC 9.7  HGB 12.3  HCT 36.5  MCV 84.1  PLT 248   Basic Metabolic Panel:  Recent Labs Lab 09/14/16 1754  NA 139  K 3.1*  CL 100*  CO2 26  GLUCOSE 121*  BUN <5*  CREATININE 0.89  CALCIUM 9.7   GFR: Estimated Creatinine Clearance: 81.2 mL/min (by C-G formula based on SCr of 0.89 mg/dL). Liver Function Tests: No results for input(s): AST, ALT, ALKPHOS, BILITOT, PROT, ALBUMIN in the last 168 hours. No results for input(s): LIPASE, AMYLASE in the last 168 hours. No results for input(s): AMMONIA in the last 168 hours. Coagulation Profile: No results for input(s): INR, PROTIME in the last 168 hours. Cardiac Enzymes: No results for input(s): CKTOTAL, CKMB, CKMBINDEX, TROPONINI in the last 168 hours. BNP (last 3 results) No results for input(s): PROBNP in the last 8760 hours. HbA1C: No results for input(s): HGBA1C in the last 72 hours. CBG: No results for input(s): GLUCAP in the last 168 hours. Lipid Profile: No results for  input(s): CHOL, HDL, LDLCALC, TRIG, CHOLHDL, LDLDIRECT in the last 72 hours. Thyroid Function Tests: No results for input(s): TSH, T4TOTAL, FREET4, T3FREE, THYROIDAB in the last 72 hours. Anemia Panel: No results for input(s): VITAMINB12, FOLATE, FERRITIN, TIBC, IRON, RETICCTPCT in the last 72 hours. Urine analysis:    Component Value Date/Time   COLORURINE STRAW (A) 06/10/2016 2315   APPEARANCEUR CLEAR 06/10/2016 2315   LABSPEC 1.009 06/10/2016 2315   PHURINE 7.0 06/10/2016 2315   GLUCOSEU NEGATIVE 06/10/2016 2315   HGBUR NEGATIVE 06/10/2016 2315   BILIRUBINUR NEGATIVE 06/10/2016 2315   KETONESUR NEGATIVE 06/10/2016 2315   PROTEINUR NEGATIVE 06/10/2016 2315   NITRITE NEGATIVE 06/10/2016 2315   LEUKOCYTESUR NEGATIVE 06/10/2016 2315   Sepsis Labs: No results found for this or any previous visit (from the past 240 hour(s)).   Radiological Exams on Admission: Dg Chest 2 View  Result Date: 09/14/2016 CLINICAL DATA:  Cough since Saturday, trouble breathing for 2 months, history asthma, hypertension, GERD EXAM: CHEST  2 VIEW COMPARISON:  09/14/2016 FINDINGS: Normal heart size, mediastinal contours, and pulmonary vascularity. Lungs clear. No pleural effusion or pneumothorax. Bones unremarkable. IMPRESSION: Normal exam. Electronically Signed   By: Ulyses Southward M.D.   On: 09/14/2016 19:04   Dg Chest 2 View  Result Date: 09/14/2016 CLINICAL DATA:  Congestion and wheezing EXAM: CHEST  2 VIEW COMPARISON:  June 10, 2016 FINDINGS: There is no edema or consolidation. Heart size and pulmonary vascularity are normal. No adenopathy. No bone lesions. IMPRESSION: No edema or consolidation. Electronically Signed   By: Bretta Bang III M.D.   On: 09/14/2016 14:36    EKG: Independently reviewed. Sinus tachycardia   Assessment/Plan Asthma exacerbation: Acute. Patient presents with complaints of cough, wheezing, and worsening shortness of breath. She reports utilizing asthma inhaler multiple times  throughout the day of last 2 months. Chest x-ray otherwise clear Suspect  acute exacerbation of moderate to severe persistent asthma. - Admit to telemetry bed - Frequent Peak flow rate monitoring  - DuoNeb's every 6 hrs and prn q 2hrs - Prednisone 60 mg po daily - added on Claritin and mucinex  Hypokalemia: Acute. Patient's initial potassium noted by 3.1 on admission. Symptoms likely secondary to Hyzaar. She was given 40 mEq of potassium chloride while in the ED. - Recheck BMP in a.m.  - May warrant being placed on potassium supplement   Essential hypertension - Continue Hyzaar  Sinus tachycardia: Suspect secondary to recent breathing treatments given - Continue to monitor  DVT prophylaxis: lovenox Code Status: Full Family Communication:  no family present at bedside  Disposition Plan: Discharged home in 1-2 days   Consults called: None Admission status: Observation   Clydie Braun MD [facility] Pager [phone]  If 7PM-7AM, please contact night-coverage [email or web address] Password [email or web address]  09/14/2016, 8:22 PM

## 2016-09-14 NOTE — ED Triage Notes (Signed)
Pt sent here from [facility] for continued labored breathing/wheezing after 2 breathing tx and "steroid shot".  HR 130 in triage.

## 2016-09-14 NOTE — Discharge Instructions (Signed)
You have had 2 DuoNeb treatments, an injection of Depo-Medrol, and your chest x-ray and EKG are unremarkable. However, you've remains symptomatic, and your symptoms have returned. At this point I think he needs to go to the emergency room for further evaluation.

## 2016-09-14 NOTE — ED Triage Notes (Signed)
The patient presented to the [facility] with a complaint of a cough and shortness of breath x 2 days. The patient reported that she has been using her inhaler with no relief.

## 2016-09-14 NOTE — ED Provider Notes (Signed)
CSN: [id number]     Arrival date & time 09/14/16  1254 History   First MD Initiated Contact with Patient 09/14/16 1402     Chief Complaint  Patient presents with  . Cough   (Consider location/radiation/quality/duration/timing/severity/associated sxs/prior Treatment) 46 year old female presents to clinic with a chief complaint of asthma exacerbation ongoing for approximately 3-4 days, states she has had poor control for asthma for the last 2 months, sat to use her rescue inhaler multiple times a day.   The history is provided by the patient.  Cough  Cough characteristics:  Non-productive, harsh and dry Sputum characteristics:  Clear Severity:  Severe Onset quality:  Gradual Duration:  3 days Progression:  Worsening Chronicity:  Chronic Smoker: no   Context: not upper respiratory infection   Relieved by:  Beta-agonist inhaler Worsened by:  Nothing Associated symptoms: shortness of breath and wheezing   Associated symptoms: no chest pain, no chills, no diaphoresis, no fever, no rash and no rhinorrhea   Shortness of breath:    Severity:  Moderate   Onset quality:  Gradual   Duration:  2 months   Timing:  Constant   Progression:  Waxing and waning   Past Medical History:  Diagnosis Date  . Asthma   . GERD (gastroesophageal reflux disease)   . Heart murmur    leaking valve  . Hypertension   . Sleep apnea    Past Surgical History:  Procedure Laterality Date  . BREAST SURGERY     reduction  . LIPOMA EXCISION    . TENDON RELEASE Right    elbow  . TONSILLECTOMY     Family History  Problem Relation Age of Onset  . Aneurysm Mother   . Stroke Father   . Hypertension Father   . Prostate cancer Father   . Diabetes Father   . Aneurysm Maternal Uncle   . Stroke Maternal Grandmother   . Hypertension Maternal Grandmother   . Heart disease Maternal Grandmother   . Diabetes Maternal Grandmother   . Aneurysm Maternal Grandfather   . Stroke Paternal Grandmother    Social  History  Substance Use Topics  . Smoking status: Never Smoker  . Smokeless tobacco: Never Used  . Alcohol use Yes     Comment: occasionally   OB History    No data available     Review of Systems  Constitutional: Negative for chills, diaphoresis and fever.  HENT: Negative for rhinorrhea.   Respiratory: Positive for cough, shortness of breath and wheezing.   Cardiovascular: Negative for chest pain.  Skin: Negative for rash.  All other systems reviewed and are negative.   Allergies  Amoxicillin; Other; Penicillins; Sulfa antibiotics; and Ultram [tramadol hcl]  Home Medications   Prior to Admission medications   Medication Sig Start Date End Date Taking? Authorizing Provider  albuterol (PROVENTIL HFA;VENTOLIN HFA) 108 (90 Base) MCG/ACT inhaler Inhale 2 puffs into the lungs every 6 (six) hours as needed for wheezing or shortness of breath.   Yes Historical Provider, MD  Cholecalciferol (VITAMIN D-3) 5000 units TABS Take 1 tablet by mouth daily.   Yes Historical Provider, MD  losartan-hydrochlorothiazide (HYZAAR) 50-12.5 MG tablet Take 1 tablet by mouth daily. 06/19/16  Yes Thomasene Lot, DO   Meds Ordered and Administered this Visit   Medications  ipratropium-albuterol (DUONEB) 0.5-2.5 (3) MG/3ML nebulizer solution 3 mL (3 mLs Nebulization Given 09/14/16 1411)  methylPREDNISolone acetate (DEPO-MEDROL) injection 80 mg (80 mg Intramuscular Given 09/14/16 1411)  ipratropium-albuterol (DUONEB) 0.5-2.5 (3) MG/3ML  nebulizer solution 3 mL (3 mLs Nebulization Given 09/14/16 1446)    BP (!) 157/91 (BP Location: Right Arm)   Pulse (!) 113   Temp 98.6 F (37 C) (Oral)   Resp 20   LMP 09/11/2016   SpO2 97%  No data found.   Physical Exam  Constitutional: She is oriented to person, place, and time. She appears well-developed and well-nourished. She does not have a sickly appearance. She does not appear ill. No distress.  HENT:  Head: Normocephalic and atraumatic.  Right Ear:  Tympanic membrane and external ear normal.  Left Ear: Tympanic membrane and external ear normal.  Nose: Nose normal. Right sinus exhibits no maxillary sinus tenderness and no frontal sinus tenderness. Left sinus exhibits no maxillary sinus tenderness and no frontal sinus tenderness.  Mouth/Throat: Uvula is midline and oropharynx is clear and moist. No oropharyngeal exudate.  Eyes: Pupils are equal, round, and reactive to light.  Neck: Normal range of motion. Neck supple. No JVD present.  Cardiovascular: Normal rate and regular rhythm.   Pulmonary/Chest: Effort normal. No respiratory distress. She has wheezes in the right upper field, the right middle field, the right lower field, the left upper field, the left middle field and the left lower field.  Abdominal: Soft. Bowel sounds are normal. She exhibits no distension. There is no tenderness. There is no guarding.  Lymphadenopathy:    She has no cervical adenopathy.  Neurological: She is alert and oriented to person, place, and time.  Skin: Skin is warm and dry. Capillary refill takes less than 2 seconds. She is not diaphoretic.  Psychiatric: She has a normal mood and affect. Her behavior is normal.  Nursing note and vitals reviewed.   Urgent Care Course     ED EKG Date/Time: 09/14/2016 4:19 PM Performed by: Dorena Bodo Authorized by: Dorena Bodo   ECG reviewed by ED Physician in the absence of a cardiologist: no   Previous ECG:    Previous ECG:  Unavailable Interpretation:    Interpretation: normal   Quality:    Tracing quality:  Limited by artifact Rate:    ECG rate:  117   ECG rate assessment: tachycardic   Rhythm:    Rhythm: sinus tachycardia   Ectopy:    Ectopy: none   QRS:    QRS axis:  Normal Conduction:    Conduction: normal   ST segments:    ST segments:  Normal T waves:    T waves: normal   Comments:     PR 136 ms QRS 66 ms QT/QTc 318/443 ms   (including critical care time)  Labs Review Labs  Reviewed - No data to display  Imaging Review Dg Chest 2 View  Result Date: 09/14/2016 CLINICAL DATA:  Congestion and wheezing EXAM: CHEST  2 VIEW COMPARISON:  June 10, 2016 FINDINGS: There is no edema or consolidation. Heart size and pulmonary vascularity are normal. No adenopathy. No bone lesions. IMPRESSION: No edema or consolidation. Electronically Signed   By: Bretta Bang III M.D.   On: 09/14/2016 14:36        MDM   1. Severe persistent asthma with exacerbation     Patient received 2 DuoNeb treatments, injection of Depo-Medrol, chest x-ray findings were negative. Patient initially had improvement in her symptoms, however began feeling weak, anxious, and felt as if she had palpitations. Patient was kept for observation, 12-lead EKG obtained. Wheezes and shortness of breath began to worsen, and the decision was made to transfer to  the ER for further evaluation.    Dorena BodoLawrence Rogen Porte, NP 09/14/16 [phone]

## 2016-09-14 NOTE — ED Provider Notes (Signed)
[facility]-EMERGENCY DEPT Provider Note   CSN: [id number] Arrival date & time: 09/14/16  1600     History   Chief Complaint Chief Complaint  Patient presents with  . Shortness of Breath    HPI Paula Lambert is a 46 y.o. female.  HPI  46 y.o. female with a hx of Asthma, HTN, presents to the Emergency Department today complaining of shortness of breath and cough since Saturday. Notes URI symptoms with sore throat and congestion. No fever. Went to UC this AM and had severe wheeze. Pt attempted albuterol at home without relief. [facility]. No CP/ABD pain. No N/V/D. No other symptoms noted  Past Medical History:  Diagnosis Date  . Asthma   . GERD (gastroesophageal reflux disease)   . Heart murmur    leaking valve  . Hypertension   . Sleep apnea     Patient Active Problem List   Diagnosis Date Noted  . Encounter for wellness examination 07/09/2016  . Pre-diabetes/ glucose intolerance  (new onset)  06/20/2016  . Elevated LDL cholesterol level 06/20/2016  . Vaccination not carried out because of patient refusal 06/20/2016  . Obesity, Class I, BMI 30-34.9 06/20/2016  . Vitamin D deficiency 06/19/2016  . Hypertension 06/05/2016  . Asthma 06/05/2016  . Sleep apnea 06/05/2016  . Heart murmur 06/05/2016  . GERD (gastroesophageal reflux disease) 06/05/2016  . Environmental and seasonal allergies 06/05/2016    Past Surgical History:  Procedure Laterality Date  . BREAST SURGERY     reduction  . LIPOMA EXCISION    . TENDON RELEASE Right    elbow  . TONSILLECTOMY      OB History    No data available       Home Medications    Prior to Admission medications   Medication Sig Start Date End Date Taking? Authorizing Provider  albuterol (PROVENTIL HFA;VENTOLIN HFA) 108 (90 Base) MCG/ACT inhaler Inhale 2 puffs into the lungs every 6 (six) hours as needed for wheezing or shortness of breath.   Yes Historical Provider, MD    Cholecalciferol (VITAMIN D-3) 5000 units TABS Take 1 tablet by mouth daily.   Yes Historical Provider, MD  losartan-hydrochlorothiazide (HYZAAR) 50-12.5 MG tablet Take 1 tablet by mouth daily. 06/19/16  Yes Thomasene Loteborah Opalski, DO  Vitamin D, Ergocalciferol, (DRISDOL) 50000 units CAPS capsule Take 50,000 Units by mouth once a week. 09/14/16  Yes Historical Provider, MD    Family History Family History  Problem Relation Age of Onset  . Aneurysm Mother   . Stroke Father   . Hypertension Father   . Prostate cancer Father   . Diabetes Father   . Aneurysm Maternal Uncle   . Stroke Maternal Grandmother   . Hypertension Maternal Grandmother   . Heart disease Maternal Grandmother   . Diabetes Maternal Grandmother   . Aneurysm Maternal Grandfather   . Stroke Paternal Grandmother     Social History Social History  Substance Use Topics  . Smoking status: Never Smoker  . Smokeless tobacco: Never Used  . Alcohol use Yes     Comment: occasionally     Allergies   Ultram [tramadol hcl]; Amoxicillin; Penicillins; Sulfa antibiotics; and Other   Review of Systems Review of Systems ROS reviewed and all are negative for acute change except as noted in the HPI.  Physical Exam Updated Vital Signs BP (!) 149/88   Pulse (!) 131   Temp 98.4 F (36.9 C) (Oral)  Resp 20   Ht 5\' 2"  (1.575 m)   Wt 84.4 kg   LMP 09/11/2016   SpO2 97%   BMI 34.02 kg/m   Physical Exam  Constitutional: She is oriented to person, place, and time. Vital signs are normal. She appears well-developed and well-nourished.  HENT:  Head: Normocephalic and atraumatic.  Right Ear: Hearing normal.  Left Ear: Hearing normal.  Eyes: Conjunctivae and EOM are normal. Pupils are equal, round, and reactive to light.  Neck: Normal range of motion.  Cardiovascular: Regular rhythm, normal heart sounds and intact distal pulses.  Tachycardia present.   Pulmonary/Chest: Effort normal. She has wheezes in the right upper field, the  right lower field, the left upper field and the left lower field.  Abdominal: Soft. There is no tenderness.  Musculoskeletal: Normal range of motion.  Neurological: She is alert and oriented to person, place, and time.  Skin: Skin is warm and dry.  Psychiatric: She has a normal mood and affect. Her speech is normal and behavior is normal. Thought content normal.  Nursing note and vitals reviewed.  ED Treatments / Results  Labs (all labs ordered are listed, but only abnormal results are displayed) Labs Reviewed  BASIC METABOLIC PANEL - Abnormal; Notable for the following:       Result Value   Potassium 3.1 (*)    Chloride 100 (*)    Glucose, Bld 121 (*)    BUN <5 (*)    All other components within normal limits  CBC    EKG  EKG Interpretation None       Radiology Dg Chest 2 View  Result Date: 09/14/2016 CLINICAL DATA:  Congestion and wheezing EXAM: CHEST  2 VIEW COMPARISON:  June 10, 2016 FINDINGS: There is no edema or consolidation. Heart size and pulmonary vascularity are normal. No adenopathy. No bone lesions. IMPRESSION: No edema or consolidation. Electronically Signed   By: Bretta Bang III M.D.   On: 09/14/2016 14:36   Procedures Procedures (including critical care time) CRITICAL CARE Performed by: Eston Esters   Total critical care time: 40 minutes  Critical care time was exclusive of separately billable procedures and treating other patients.  Critical care was necessary to treat or prevent imminent or life-threatening deterioration.  Critical care was time spent personally by me on the following activities: development of treatment plan with patient and/or surrogate as well as nursing, discussions with consultants, evaluation of patient's response to treatment, examination of patient, obtaining history from patient or surrogate, ordering and performing treatments and interventions, ordering and review of laboratory studies, ordering and review of  radiographic studies, pulse oximetry and re-evaluation of patient's condition.   Medications Ordered in ED Medications  albuterol (PROVENTIL,VENTOLIN) solution continuous neb (not administered)  sodium chloride 0.9 % bolus 1,000 mL (not administered)   Initial Impression / Assessment and Plan / ED Course  I have reviewed the triage vital signs and the nursing notes.  Pertinent labs & imaging results that were available during my care of the patient were reviewed by me and considered in my medical decision making (see chart for details).  Final Clinical Impressions(s) / ED Diagnoses  {I have reviewed and evaluated the relevant laboratory values. {I have reviewed and evaluated the relevant imaging studies.  {I have reviewed the relevant previous healthcare records.  {I obtained HPI from historian.   ED Course:  Assessment: Pt is a 46 y.o. female with hx Asthma, HTN who presents with acute asthma exacerbation from UC.  URI symptoms since Saturday. No fevers. On exam, pt in NAD. Nontoxic/nonseptic appearing. VS with tachycardia. Afebrile. Lungs Bilateral wheeze. Heart RRR. Abdomen nontender soft. Given X 2 tx at [facility] and steroids. Given continous neb in ED with minimal relief. Added Magnesium. Labs unremarkable. CXR unremarkable. Plan is to Admit to medicine.   Disposition/Plan:  Admit Pt acknowledges and agrees with plan  Supervising Physician Mancel Bale, MD  Final diagnoses:  Severe persistent asthma with exacerbation    New Prescriptions New Prescriptions   No medications on file     Audry Pili, PA-C 09/14/16 1919

## 2016-09-15 DIAGNOSIS — T788XXA Other adverse effects, not elsewhere classified, initial encounter: Secondary | ICD-10-CM | POA: Diagnosis present

## 2016-09-15 DIAGNOSIS — Z79899 Other long term (current) drug therapy: Secondary | ICD-10-CM | POA: Diagnosis not present

## 2016-09-15 DIAGNOSIS — G4730 Sleep apnea, unspecified: Secondary | ICD-10-CM | POA: Diagnosis present

## 2016-09-15 DIAGNOSIS — Z91018 Allergy to other foods: Secondary | ICD-10-CM | POA: Diagnosis not present

## 2016-09-15 DIAGNOSIS — R000 Tachycardia, unspecified: Secondary | ICD-10-CM | POA: Diagnosis not present

## 2016-09-15 DIAGNOSIS — Z91010 Allergy to peanuts: Secondary | ICD-10-CM | POA: Diagnosis not present

## 2016-09-15 DIAGNOSIS — Z880 Allergy status to penicillin: Secondary | ICD-10-CM | POA: Diagnosis not present

## 2016-09-15 DIAGNOSIS — I10 Essential (primary) hypertension: Secondary | ICD-10-CM | POA: Diagnosis not present

## 2016-09-15 DIAGNOSIS — Y848 Other medical procedures as the cause of abnormal reaction of the patient, or of later complication, without mention of misadventure at the time of the procedure: Secondary | ICD-10-CM | POA: Diagnosis present

## 2016-09-15 DIAGNOSIS — Z882 Allergy status to sulfonamides status: Secondary | ICD-10-CM | POA: Diagnosis not present

## 2016-09-15 DIAGNOSIS — J4551 Severe persistent asthma with (acute) exacerbation: Secondary | ICD-10-CM | POA: Diagnosis not present

## 2016-09-15 DIAGNOSIS — Z885 Allergy status to narcotic agent status: Secondary | ICD-10-CM | POA: Diagnosis not present

## 2016-09-15 DIAGNOSIS — E876 Hypokalemia: Secondary | ICD-10-CM | POA: Diagnosis present

## 2016-09-15 DIAGNOSIS — T8181XA Complication of inhalation therapy, initial encounter: Secondary | ICD-10-CM | POA: Diagnosis present

## 2016-09-15 LAB — BASIC METABOLIC PANEL
Anion gap: 9 (ref 5–15)
BUN: 6 mg/dL (ref 6–20)
CHLORIDE: 105 mmol/L (ref 101–111)
CO2: 25 mmol/L (ref 22–32)
Calcium: 9.3 mg/dL (ref 8.9–10.3)
Creatinine, Ser: 0.79 mg/dL (ref 0.44–1.00)
GFR calc Af Amer: >60 mL/min (ref >60)
Glucose, Bld: 106 mg/dL — ABNORMAL HIGH (ref 65–99)
POTASSIUM: 3.5 mmol/L (ref 3.5–5.1)
SODIUM: 139 mmol/L (ref 135–145)

## 2016-09-15 LAB — CBC
HCT: 33.6 % — ABNORMAL LOW (ref 36.0–46.0)
Hemoglobin: 11.3 g/dL — ABNORMAL LOW (ref 12.0–15.0)
MCH: 28.2 pg (ref 26.0–34.0)
MCHC: 33.6 g/dL (ref 30.0–36.0)
MCV: 83.8 fL (ref 78.0–100.0)
PLATELETS: 237 10*3/uL (ref 150–400)
RBC: 4.01 MIL/uL (ref 3.87–5.11)
RDW: 14.4 % (ref 11.5–15.5)
WBC: 8.8 10*3/uL (ref 4.0–10.5)

## 2016-09-15 MED ORDER — BUDESONIDE 0.25 MG/2ML IN SUSP
0.2500 mg | Freq: Two times a day (BID) | RESPIRATORY_TRACT | Status: DC
  Administered 2016-09-15 – 2016-09-17 (×4): 0.25 mg via RESPIRATORY_TRACT
  Filled 2016-09-15 (×4): qty 2

## 2016-09-15 MED ORDER — METHYLPREDNISOLONE SODIUM SUCC 125 MG IJ SOLR
60.0000 mg | Freq: Four times a day (QID) | INTRAMUSCULAR | Status: DC
  Administered 2016-09-15 – 2016-09-17 (×8): 60 mg via INTRAVENOUS
  Filled 2016-09-15 (×8): qty 2

## 2016-09-15 MED ORDER — IPRATROPIUM BROMIDE 0.02 % IN SOLN
0.5000 mg | Freq: Four times a day (QID) | RESPIRATORY_TRACT | Status: DC
  Administered 2016-09-15 – 2016-09-16 (×6): 0.5 mg via RESPIRATORY_TRACT
  Filled 2016-09-15 (×6): qty 2.5

## 2016-09-15 MED ORDER — IPRATROPIUM BROMIDE 0.02 % IN SOLN: 0.5000 mg | RESPIRATORY_TRACT | Status: DC

## 2016-09-15 MED ORDER — AZITHROMYCIN 500 MG PO TABS
500.0000 mg | ORAL_TABLET | Freq: Every day | ORAL | Status: DC
  Administered 2016-09-15 – 2016-09-17 (×3): 500 mg via ORAL
  Filled 2016-09-15 (×3): qty 1

## 2016-09-15 MED ORDER — LEVALBUTEROL HCL 0.63 MG/3ML IN NEBU: 0.6300 mg | INHALATION_SOLUTION | RESPIRATORY_TRACT | Status: DC

## 2016-09-15 MED ORDER — GUAIFENESIN-CODEINE 100-10 MG/5ML PO SOLN
5.0000 mL | Freq: Four times a day (QID) | ORAL | Status: DC | PRN
  Administered 2016-09-15 – 2016-09-16 (×2): 5 mL via ORAL
  Filled 2016-09-15 (×2): qty 5

## 2016-09-15 MED ORDER — MONTELUKAST SODIUM 10 MG PO TABS
10.0000 mg | ORAL_TABLET | Freq: Every day | ORAL | Status: DC
  Administered 2016-09-15 – 2016-09-16 (×2): 10 mg via ORAL
  Filled 2016-09-15 (×2): qty 1

## 2016-09-15 MED ORDER — LEVALBUTEROL HCL 0.63 MG/3ML IN NEBU
0.6300 mg | INHALATION_SOLUTION | Freq: Four times a day (QID) | RESPIRATORY_TRACT | Status: DC
  Administered 2016-09-15 – 2016-09-16 (×6): 0.63 mg via RESPIRATORY_TRACT
  Filled 2016-09-15 (×6): qty 3

## 2016-09-15 MED ORDER — LEVALBUTEROL HCL 0.63 MG/3ML IN NEBU: 0.6300 mg | INHALATION_SOLUTION | Freq: Four times a day (QID) | RESPIRATORY_TRACT | Status: DC

## 2016-09-15 MED ORDER — BENZONATATE 100 MG PO CAPS
100.0000 mg | ORAL_CAPSULE | Freq: Three times a day (TID) | ORAL | Status: DC
  Administered 2016-09-15 – 2016-09-17 (×7): 100 mg via ORAL
  Filled 2016-09-15 (×7): qty 1

## 2016-09-15 NOTE — Progress Notes (Signed)
[facility]                                                                              Patient Demographics  Paula Lambert, is a 46 y.o. female, DOB - 10-01-1970, ONG:[id number]  Admit date - 09/14/2016   Admitting Physician Clydie Braun, MD  Outpatient Primary MD for the patient is Thomasene Lot, DO  Outpatient specialists:   LOS - 0  days    Chief Complaint  Patient presents with  . Shortness of Breath       Brief summary    Paula Lambert is a 46 y.o. female with medical history significant of HTN and asthma; who presents with complaints of shortness of breath for the last 3-4 days.  Symptoms initially started with sore throat and sinus congestion. Now reporting productive cough with clear sputum and wheezing. Coughing spells seem to make symptoms worse. Reports utilizing her home inhaler multiple times daily as well as TheraFlu without relief of symptoms. Patient reports difficult control asthma over the last couple months which she's had repeated urgent care visits,   Assessment & Plan    Principal Problem: Acute Asthma exacerbation: With a history of moderate persistent asthma  - Per patient she has been on different maintenance inhaled corticosteroids in the past however she moved from [place] last year, did establish PCP however her PCP has been out of office due to surgery. In the past, Qvar has worked good. She has been asthmatic since she was a child. She has been on Singulair as well in the past - She only had albuterol inhaler at home and has been using very frequently more than 4 days a week, which qualifies for moderate to severe persistent asthma. No history of intubation in the past. - Currently tight and wheezing, will DC oral prednisone, place on scheduled nebs with Xopenex as she is tachycardiac, place on IV Solu-Medrol, Zithromax - Continue Claritin, added Singulair, on Pulmicort, however will change to Qvar at discharge - Pulmonology  outpatient follow-up at the time of discharge. -Place on Tessalon, antitussives   Active problems Hypokalemia: Acute. - Replaced  Essential hypertension - Continue Hyzaar  Sinus tachycardia: Suspect secondary to recent breathing treatments given -Change to Xopenex nebs    Code Status: full code  DVT Prophylaxis:  Lovenox  Family Communication: Discussed in detail with the patient, all imaging results, lab results explained to the patient and husband at the bedside  Disposition Plan: In next 24-48 hours months improving  Time Spent in minutes  25 minutes  Procedures:   Consultants:     Antimicrobials:   Zithromax 3/27   Medications  Scheduled Meds: . benzonatate  100 mg Oral TID  . budesonide (PULMICORT) nebulizer solution  0.25 mg Nebulization BID  . enoxaparin (LOVENOX) injection  40 mg Subcutaneous Q24H  . losartan  50 mg Oral Daily   And  . hydrochlorothiazide  12.5 mg Oral Daily  . ipratropium  0.5 mg Nebulization Q6H   And  . levalbuterol  0.63 mg Nebulization Q6H  . loratadine  10 mg Oral Daily  . methylPREDNISolone (SOLU-MEDROL) injection  60 mg Intravenous  Q6H  . montelukast  10 mg Oral QHS   Continuous Infusions: PRN Meds:.acetaminophen **OR** acetaminophen, guaiFENesin-codeine, ondansetron **OR** ondansetron (ZOFRAN) IV   Antibiotics   Anti-infectives    None        Subjective:   Paula Lambert was seen and examined today. Still tight and wheezing, coughing. Still short of breath.  Patient denies dizziness, chest pain, abdominal pain, N/V/D/C, new weakness, numbess, tingling. No acute events overnight.    Objective:   Vitals:   09/15/16 0421 09/15/16 0524 09/15/16 0801 09/15/16 0942  BP: 126/81   127/85  Pulse: (!) 106   (!) 122  Resp: 18   19  Temp: 98.6 F (37 C)   98.2 F (36.8 C)  TempSrc: Oral   Oral  SpO2: 90% 90% 93% 92%  Weight:      Height:        Intake/Output Summary (Last 24 hours) at 09/15/16 1119 Last data  filed at 09/15/16 1000  Gross per 24 hour  Intake              240 ml  Output              525 ml  Net             -285 ml     Wt Readings from Last 3 Encounters:  09/14/16 83.7 kg (184 lb 9.6 oz)  06/19/16 82.4 kg (181 lb 9.6 oz)  06/10/16 85 kg (187 lb 6 oz)     Exam  General: Alert and oriented x 3, NAD,Coughing and wheezing audibly   HEENT:    Neck: Supple, no JVD, no masses  Cardiovascular: S1 S2 auscultated, RRR, tachycardia   Respiratory: Diffuse expiratory wheezing bilaterally   Gastrointestinal: Soft, nontender, nondistended, + bowel sounds  Ext: no cyanosis clubbing or edema  Neuro: AAOx3, Cr N's II- XII. Strength 5/5 upper and lower extremities bilaterally  Skin: No rashes  Psych: Normal affect and demeanor, alert and oriented x3    Data Reviewed:  I have personally reviewed following labs and imaging studies  Micro Results No results found for this or any previous visit (from the past 240 hour(s)).  Radiology Reports Dg Chest 2 View  Result Date: 09/14/2016 CLINICAL DATA:  Cough since Saturday, trouble breathing for 2 months, history asthma, hypertension, GERD EXAM: CHEST  2 VIEW COMPARISON:  09/14/2016 FINDINGS: Normal heart size, mediastinal contours, and pulmonary vascularity. Lungs clear. No pleural effusion or pneumothorax. Bones unremarkable. IMPRESSION: Normal exam. Electronically Signed   By: Ulyses SouthwardMark  Boles M.D.   On: 09/14/2016 19:04   Dg Chest 2 View  Result Date: 09/14/2016 CLINICAL DATA:  Congestion and wheezing EXAM: CHEST  2 VIEW COMPARISON:  June 10, 2016 FINDINGS: There is no edema or consolidation. Heart size and pulmonary vascularity are normal. No adenopathy. No bone lesions. IMPRESSION: No edema or consolidation. Electronically Signed   By: Bretta BangWilliam  Woodruff III M.D.   On: 09/14/2016 14:36    Lab Data:  CBC:  Recent Labs Lab 09/14/16 1754 09/15/16 0543  WBC 9.7 8.8  HGB 12.3 11.3*  HCT 36.5 33.6*  MCV 84.1 83.8  PLT 248  237   Basic Metabolic Panel:  Recent Labs Lab 09/14/16 1754 09/15/16 0543  NA 139 139  K 3.1* 3.5  CL 100* 105  CO2 26 25  GLUCOSE 121* 106*  BUN <5* 6  CREATININE 0.89 0.79  CALCIUM 9.7 9.3   GFR: Estimated Creatinine Clearance: 90 mL/min (by C-G formula  based on SCr of 0.79 mg/dL). Liver Function Tests: No results for input(s): AST, ALT, ALKPHOS, BILITOT, PROT, ALBUMIN in the last 168 hours. No results for input(s): LIPASE, AMYLASE in the last 168 hours. No results for input(s): AMMONIA in the last 168 hours. Coagulation Profile: No results for input(s): INR, PROTIME in the last 168 hours. Cardiac Enzymes: No results for input(s): CKTOTAL, CKMB, CKMBINDEX, TROPONINI in the last 168 hours. BNP (last 3 results) No results for input(s): PROBNP in the last 8760 hours. HbA1C: No results for input(s): HGBA1C in the last 72 hours. CBG: No results for input(s): GLUCAP in the last 168 hours. Lipid Profile: No results for input(s): CHOL, HDL, LDLCALC, TRIG, CHOLHDL, LDLDIRECT in the last 72 hours. Thyroid Function Tests: No results for input(s): TSH, T4TOTAL, FREET4, T3FREE, THYROIDAB in the last 72 hours. Anemia Panel: No results for input(s): VITAMINB12, FOLATE, FERRITIN, TIBC, IRON, RETICCTPCT in the last 72 hours. Urine analysis:    Component Value Date/Time   COLORURINE STRAW (A) 06/10/2016 2315   APPEARANCEUR CLEAR 06/10/2016 2315   LABSPEC 1.009 06/10/2016 2315   PHURINE 7.0 06/10/2016 2315   GLUCOSEU NEGATIVE 06/10/2016 2315   HGBUR NEGATIVE 06/10/2016 2315   BILIRUBINUR NEGATIVE 06/10/2016 2315   KETONESUR NEGATIVE 06/10/2016 2315   PROTEINUR NEGATIVE 06/10/2016 2315   NITRITE NEGATIVE 06/10/2016 2315   LEUKOCYTESUR NEGATIVE 06/10/2016 2315     RAI,RIPUDEEP M.D. [facility] 09/15/2016, 11:19 AM  Pager: [phone] Between 7am to 7pm - call Pager - [phone]  After 7pm go to [email or web address] - password [email or web address]  Call night coverage person covering after  7pm

## 2016-09-16 LAB — CBC
HCT: 38.7 % (ref 36.0–46.0)
HEMOGLOBIN: 13 g/dL (ref 12.0–15.0)
MCH: 28.4 pg (ref 26.0–34.0)
MCHC: 33.6 g/dL (ref 30.0–36.0)
MCV: 84.7 fL (ref 78.0–100.0)
Platelets: 274 10*3/uL (ref 150–400)
RBC: 4.57 MIL/uL (ref 3.87–5.11)
RDW: 15 % (ref 11.5–15.5)
WBC: 12.6 10*3/uL — ABNORMAL HIGH (ref 4.0–10.5)

## 2016-09-16 LAB — BASIC METABOLIC PANEL
ANION GAP: 10 (ref 5–15)
BUN: 9 mg/dL (ref 6–20)
CALCIUM: 9.7 mg/dL (ref 8.9–10.3)
CHLORIDE: 103 mmol/L (ref 101–111)
CO2: 24 mmol/L (ref 22–32)
CREATININE: 0.86 mg/dL (ref 0.44–1.00)
GFR calc Af Amer: >60 mL/min (ref >60)
GFR calc non Af Amer: >60 mL/min (ref >60)
GLUCOSE: 159 mg/dL — AB (ref 65–99)
Potassium: 3.5 mmol/L (ref 3.5–5.1)
Sodium: 137 mmol/L (ref 135–145)

## 2016-09-16 MED ORDER — IPRATROPIUM BROMIDE 0.02 % IN SOLN
0.5000 mg | Freq: Three times a day (TID) | RESPIRATORY_TRACT | Status: DC
  Administered 2016-09-17: 0.5 mg via RESPIRATORY_TRACT
  Filled 2016-09-16: qty 2.5

## 2016-09-16 MED ORDER — IPRATROPIUM BROMIDE 0.02 % IN SOLN: 0.5000 mg | Freq: Four times a day (QID) | RESPIRATORY_TRACT | Status: DC

## 2016-09-16 MED ORDER — LEVALBUTEROL HCL 0.63 MG/3ML IN NEBU: 0.6300 mg | INHALATION_SOLUTION | Freq: Three times a day (TID) | RESPIRATORY_TRACT | Status: DC

## 2016-09-16 MED ORDER — LEVALBUTEROL HCL 0.63 MG/3ML IN NEBU
0.6300 mg | INHALATION_SOLUTION | Freq: Three times a day (TID) | RESPIRATORY_TRACT | Status: DC
  Administered 2016-09-17: 0.63 mg via RESPIRATORY_TRACT
  Filled 2016-09-16: qty 3

## 2016-09-16 NOTE — Progress Notes (Signed)
[facility]                                                                              Patient Demographics  Paula Lambert, is a 46 y.o. female, DOB - 03/05/1971, ZOX:[id number]  Admit date - 09/14/2016   Admitting Physician Clydie Braunondell A Smith, MD  Outpatient Primary MD for the patient is Thomasene Loteborah Opalski, DO  Outpatient specialists:   LOS - 1  days    Chief Complaint  Patient presents with  . Shortness of Breath       Brief summary    Paula Lambert is a [age] y.o. female with medical history significant of HTN and asthma; who presents with complaints of shortness of breath for the last 3-4 days.  Symptoms initially started with sore throat and sinus congestion. Now reporting productive cough with clear sputum and wheezing. Coughing spells seem to make symptoms worse. Reports utilizing her home inhaler multiple times daily as well as TheraFlu without relief of symptoms. Patient reports difficult control asthma over the last couple months which she's had repeated urgent care visits,   Assessment & Plan    Principal Problem: Acute Asthma exacerbation: With a history of moderate persistent asthma  - Per patient she has been on different maintenance inhaled corticosteroids in the past however [place] moved from [place] last year, did establish PCP however her PCP has been out of office due to surgery. In the past, Qvar has worked good. She has been asthmatic since she was a child. She has been on Singulair as well in the past. She only had albuterol inhaler at home and has been using very frequently more than 4 days a week, which qualifies for moderate to severe persistent asthma. No history of intubation in the past. - Still wheezing, will continue scheduled nebs with Xopenex, Solu-Medrol, Zithromax - Continue Claritin, on Pulmicort, however will change to Qvar at discharge - Pulmonology outpatient follow-up at the time of discharge. -Place on Tessalon, antitussives    Active problems Hypokalemia: Acute. - Replaced  Essential hypertension - Continue Hyzaar  Sinus tachycardia: Suspect secondary to recent breathing treatments given -Change to Xopenex nebs    Code Status: full code  DVT Prophylaxis:  Lovenox  Family Communication: Discussed in detail with the patient, all imaging results, lab results explained to the patient   Disposition Plan: In next 24-48 hours months improving  Time Spent in minutes  25 minutes  Procedures:   Consultants:     Antimicrobials:   Zithromax 3/27   Medications  Scheduled Meds: . azithromycin  500 mg Oral Daily  . benzonatate  100 mg Oral TID  . budesonide (PULMICORT) nebulizer solution  0.25 mg Nebulization BID  . enoxaparin (LOVENOX) injection  40 mg Subcutaneous Q24H  . losartan  50 mg Oral Daily   And  . hydrochlorothiazide  12.5 mg Oral Daily  . ipratropium  0.5 mg Nebulization Q6H   And  . levalbuterol  0.63 mg Nebulization Q6H  . loratadine  10 mg Oral Daily  . methylPREDNISolone (SOLU-MEDROL) injection  60 mg Intravenous Q6H  . montelukast  10 mg Oral QHS   Continuous  Infusions: PRN Meds:.acetaminophen **OR** acetaminophen, guaiFENesin-codeine, ondansetron **OR** ondansetron (ZOFRAN) IV   Antibiotics   Anti-infectives    Start     Dose/Rate Route Frequency Ordered Stop   09/15/16 1130  azithromycin (ZITHROMAX) tablet 500 mg     500 mg Oral Daily 09/15/16 1119          Subjective:   Paula Lambert was seen and examined today. Still wheezing, But slightly improving from yesterday, cough is improving. Shortness of breath is much better.   Patient denies dizziness, chest pain, abdominal pain, N/V/D/C, new weakness, numbess, tingling. No acute events overnight.    Objective:   Vitals:   09/16/16 0223 09/16/16 0553 09/16/16 0821 09/16/16 0925  BP:  121/84  139/85  Pulse: (!) 117 (!) 101  (!) 120  Resp: 18 16  17   Temp:  98.1 F (36.7 C)  98.2 F (36.8 C)  TempSrc:     Oral  SpO2: 93% 92% 95% 92%  Weight:      Height:        Intake/Output Summary (Last 24 hours) at 09/16/16 1158 Last data filed at 09/16/16 1610  Gross per 24 hour  Intake              840 ml  Output             2350 ml  Net            -1510 ml     Wt Readings from Last 3 Encounters:  09/15/16 83 kg (183 lb)  06/19/16 82.4 kg (181 lb 9.6 oz)  06/10/16 85 kg (187 lb 6 oz)     Exam  General: Alert and oriented x 3, NAD  HEENT:    Neck: Supple, no JVD, no masses  Cardiovascular: S1 S2 auscultated, RRR, tachycardia   Respiratory: Diffuse expiratory wheezing bilaterally Slightly improving from yesterday  Gastrointestinal: Soft, nontender, nondistended, + bowel sounds  Ext: no cyanosis clubbing or edema  Neuro: no new deficits   Skin: No rashes  Psych: Normal affect and demeanor, alert and oriented x3    Data Reviewed:  I have personally reviewed following labs and imaging studies  Micro Results No results found for this or any previous visit (from the past 240 hour(s)).  Radiology Reports Dg Chest 2 View  Result Date: 09/14/2016 CLINICAL DATA:  Cough since Saturday, trouble breathing for 2 months, history asthma, hypertension, GERD EXAM: CHEST  2 VIEW COMPARISON:  09/14/2016 FINDINGS: Normal heart size, mediastinal contours, and pulmonary vascularity. Lungs clear. No pleural effusion or pneumothorax. Bones unremarkable. IMPRESSION: Normal exam. Electronically Signed   By: Ulyses Southward M.D.   On: 09/14/2016 19:04   Dg Chest 2 View  Result Date: 09/14/2016 CLINICAL DATA:  Congestion and wheezing EXAM: CHEST  2 VIEW COMPARISON:  June 10, 2016 FINDINGS: There is no edema or consolidation. Heart size and pulmonary vascularity are normal. No adenopathy. No bone lesions. IMPRESSION: No edema or consolidation. Electronically Signed   By: Bretta Bang III M.D.   On: 09/14/2016 14:36    Lab Data:  CBC:  Recent Labs Lab 09/14/16 1754 09/15/16 0543  09/16/16 0618  WBC 9.7 8.8 12.6*  HGB 12.3 11.3* 13.0  HCT 36.5 33.6* 38.7  MCV 84.1 83.8 84.7  PLT 248 237 274   Basic Metabolic Panel:  Recent Labs Lab 09/14/16 1754 09/15/16 0543 09/16/16 0618  NA 139 139 137  K 3.1* 3.5 3.5  CL 100* 105 103  CO2 26 25  24  GLUCOSE 121* 106* 159*  BUN <5* 6 9  CREATININE 0.89 0.79 0.86  CALCIUM 9.7 9.3 9.7   GFR: Estimated Creatinine Clearance: 83.4 mL/min (by C-G formula based on SCr of 0.86 mg/dL). Liver Function Tests: No results for input(s): AST, ALT, ALKPHOS, BILITOT, PROT, ALBUMIN in the last 168 hours. No results for input(s): LIPASE, AMYLASE in the last 168 hours. No results for input(s): AMMONIA in the last 168 hours. Coagulation Profile: No results for input(s): INR, PROTIME in the last 168 hours. Cardiac Enzymes: No results for input(s): CKTOTAL, CKMB, CKMBINDEX, TROPONINI in the last 168 hours. BNP (last 3 results) No results for input(s): PROBNP in the last 8760 hours. HbA1C: No results for input(s): HGBA1C in the last 72 hours. CBG: No results for input(s): GLUCAP in the last 168 hours. Lipid Profile: No results for input(s): CHOL, HDL, LDLCALC, TRIG, CHOLHDL, LDLDIRECT in the last 72 hours. Thyroid Function Tests: No results for input(s): TSH, T4TOTAL, FREET4, T3FREE, THYROIDAB in the last 72 hours. Anemia Panel: No results for input(s): VITAMINB12, FOLATE, FERRITIN, TIBC, IRON, RETICCTPCT in the last 72 hours. Urine analysis:    Component Value Date/Time   COLORURINE STRAW (A) 06/10/2016 2315   APPEARANCEUR CLEAR 06/10/2016 2315   LABSPEC 1.009 06/10/2016 2315   PHURINE 7.0 06/10/2016 2315   GLUCOSEU NEGATIVE 06/10/2016 2315   HGBUR NEGATIVE 06/10/2016 2315   BILIRUBINUR NEGATIVE 06/10/2016 2315   KETONESUR NEGATIVE 06/10/2016 2315   PROTEINUR NEGATIVE 06/10/2016 2315   NITRITE NEGATIVE 06/10/2016 2315   LEUKOCYTESUR NEGATIVE 06/10/2016 2315     Donley Harland M.D. [facility] 09/16/2016, 11:58  AM  Pager: [phone] Between 7am to 7pm - call Pager - [phone]  After 7pm go to [email or web address] - password [email or web address]  Call night coverage person covering after 7pm

## 2016-09-17 MED ORDER — AZITHROMYCIN 500 MG PO TABS: 500.0000 mg | ORAL_TABLET | Freq: Every day | ORAL | 0 refills | Status: DC

## 2016-09-17 MED ORDER — BECLOMETHASONE DIPROPIONATE 80 MCG/ACT IN AERS: 2.0000 | INHALATION_SPRAY | Freq: Two times a day (BID) | RESPIRATORY_TRACT | 12 refills | Status: DC

## 2016-09-17 MED ORDER — LORATADINE 10 MG PO TABS: 10.0000 mg | ORAL_TABLET | Freq: Every day | ORAL | 0 refills | Status: DC

## 2016-09-17 MED ORDER — MONTELUKAST SODIUM 10 MG PO TABS: 10.0000 mg | ORAL_TABLET | Freq: Every day | ORAL | 3 refills | Status: DC

## 2016-09-17 MED ORDER — GUAIFENESIN-CODEINE 100-10 MG/5ML PO SOLN: 5.0000 mL | Freq: Four times a day (QID) | ORAL | 0 refills | Status: DC | PRN

## 2016-09-17 MED ORDER — BENZONATATE 100 MG PO CAPS: 100.0000 mg | ORAL_CAPSULE | Freq: Three times a day (TID) | ORAL | 0 refills | Status: DC | PRN

## 2016-09-17 MED ORDER — PREDNISONE 50 MG PO TABS
60.0000 mg | ORAL_TABLET | Freq: Once | ORAL | Status: AC
  Administered 2016-09-17: 60 mg via ORAL
  Filled 2016-09-17: qty 1

## 2016-09-17 MED ORDER — OMEPRAZOLE 20 MG PO CPDR: 20.0000 mg | DELAYED_RELEASE_CAPSULE | Freq: Every day | ORAL | 0 refills | Status: DC

## 2016-09-17 MED ORDER — PREDNISONE 10 MG PO TABS: ORAL_TABLET | ORAL | 0 refills | Status: DC

## 2016-09-17 NOTE — Progress Notes (Signed)
Paula Lambert to be D/C'd Home per MD order.  Discussed prescriptions and follow up appointments with the patient. Prescriptions given to patient, medication list explained in detail. Pt verbalized understanding.  Allergies as of 09/17/2016      Reactions   Ultram [tramadol Hcl] Other (See Comments)   syncope   Amoxicillin Hives, Swelling   Penicillins Hives, Swelling   Sulfa Antibiotics Hives   Other Other (See Comments)   PEANUTS AND ALMONDS      Medication List    TAKE these medications   albuterol 108 (90 Base) MCG/ACT inhaler Commonly known as:  PROVENTIL HFA;VENTOLIN HFA Inhale 2 puffs into the lungs every 6 (six) hours as needed for wheezing or shortness of breath.   azithromycin 500 MG tablet Commonly known as:  ZITHROMAX Take 1 tablet (500 mg total) by mouth daily.   beclomethasone 80 MCG/ACT inhaler Commonly known as:  QVAR Inhale 2 puffs into the lungs 2 (two) times daily.   benzonatate 100 MG capsule Commonly known as:  TESSALON Take 1 capsule (100 mg total) by mouth 3 (three) times daily as needed for cough. For cough   guaiFENesin-codeine 100-10 MG/5ML syrup Take 5 mLs by mouth every 6 (six) hours as needed for cough.   loratadine 10 MG tablet Commonly known as:  CLARITIN Take 1 tablet (10 mg total) by mouth daily.   losartan-hydrochlorothiazide 50-12.5 MG tablet Commonly known as:  HYZAAR Take 1 tablet by mouth daily.   montelukast 10 MG tablet Commonly known as:  SINGULAIR Take 1 tablet (10 mg total) by mouth at bedtime.   omeprazole 20 MG capsule Commonly known as:  PRILOSEC Take 1 capsule (20 mg total) by mouth daily. Any generic PPI is okay. Please take prilosec while you are on prednisone   predniSONE 10 MG tablet Commonly known as:  DELTASONE Prednisone dosing: Take  Prednisone 40mg  (4 tabs) x 3 days, then taper to 30mg  (3 tabs) x 3 days, then 20mg  (2 tabs) x 3days, then 10mg  (1 tab) x 3days, then OFF. Start taking on:  09/18/2016   Vitamin  D (Ergocalciferol) 50000 units Caps capsule Commonly known as:  DRISDOL Take 50,000 Units by mouth once a week.   Vitamin D-3 5000 units Tabs Take 1 tablet by mouth daily.       Vitals:   09/17/16 0537 09/17/16 0920  BP: 127/80 118/86  Pulse: (!) 101 96  Resp: 14 17  Temp: 98 F (36.7 C) 97.9 F (36.6 C)    Skin clean, dry and intact without evidence of skin break down, no evidence of skin tears noted. IV catheter discontinued intact. Site without signs and symptoms of complications. Dressing and pressure applied. Pt denies pain at this time. No complaints noted.  An After Visit Summary was printed and given to the patient. Patient escorted via WC, and D/C home via private auto.  Paula RothmanNatalie Holt Woolbright, RN [facility] Phone [phone]

## 2016-09-17 NOTE — Discharge Summary (Signed)
Physician Discharge Summary   Patient ID: Paula Lambert All MRN: [id number] DOB/AGE: [birth year] 46 y.o.  Admit date: 09/14/2016 Discharge date: 09/17/2016  Primary Care Physician:  Paula Loteborah Opalski, DO  Discharge Diagnoses:        . ACUTE Asthma exacerbation . Hypokalemia . Essential hypertension . Sinus tachycardia   Consults: NONE   Recommendations for Outpatient Follow-up:  1. Please repeat CBC/BMET at next visit 2. Pulmonology follow-up scheduled outpatient   DIET: Heart healthy diet    Allergies:   Allergies  Allergen Reactions  . Ultram [Tramadol Hcl] Other (See Comments)    syncope  . Amoxicillin Hives and Swelling  . Penicillins Hives and Swelling  . Sulfa Antibiotics Hives  . Other Other (See Comments)    PEANUTS AND ALMONDS     DISCHARGE MEDICATIONS: Current Discharge Medication List    START taking these medications   Details  azithromycin (ZITHROMAX) 500 MG tablet Take 1 tablet (500 mg total) by mouth daily. Qty: 5 tablet, Refills: 0    beclomethasone (QVAR) 80 MCG/ACT inhaler Inhale 2 puffs into the lungs 2 (two) times daily. Qty: 1 Inhaler, Refills: 12    benzonatate (TESSALON) 100 MG capsule Take 1 capsule (100 mg total) by mouth 3 (three) times daily as needed for cough. For cough Qty: 30 capsule, Refills: 0    guaiFENesin-codeine 100-10 MG/5ML syrup Take 5 mLs by mouth every 6 (six) hours as needed for cough. Qty: 120 mL, Refills: 0    loratadine (CLARITIN) 10 MG tablet Take 1 tablet (10 mg total) by mouth daily. Qty: 30 tablet, Refills: 0    montelukast (SINGULAIR) 10 MG tablet Take 1 tablet (10 mg total) by mouth at bedtime. Qty: 30 tablet, Refills: 3    omeprazole (PRILOSEC) 20 MG capsule Take 1 capsule (20 mg total) by mouth daily. Any generic PPI is okay. Please take prilosec while you are on prednisone Qty: 30 capsule, Refills: 0    predniSONE (DELTASONE) 10 MG tablet Prednisone dosing: Take  Prednisone 40mg  (4 tabs) x 3 days,  then taper to 30mg  (3 tabs) x 3 days, then 20mg  (2 tabs) x 3days, then 10mg  (1 tab) x 3days, then OFF. Qty: 30 tablet, Refills: 0      CONTINUE these medications which have NOT CHANGED   Details  albuterol (PROVENTIL HFA;VENTOLIN HFA) 108 (90 Base) MCG/ACT inhaler Inhale 2 puffs into the lungs every 6 (six) hours as needed for wheezing or shortness of breath.    Cholecalciferol (VITAMIN D-3) 5000 units TABS Take 1 tablet by mouth daily.    losartan-hydrochlorothiazide (HYZAAR) 50-12.5 MG tablet Take 1 tablet by mouth daily. Qty: 90 tablet, Refills: 3    Vitamin D, Ergocalciferol, (DRISDOL) 50000 units CAPS capsule Take 50,000 Units by mouth once a week.         Brief H and P: For complete details please refer to admission H and P, but in brief Paula Lambert a 46 y.o.femalewith medical history significant of HTN and asthma; whopresents with complaints of shortness of breath for the last 3-4 days. Symptoms initially started with sore throat and sinus congestion. Now reporting productive cough with clear sputum and wheezing. Coughing spells seem to make symptoms worse. Reports utilizing her home inhaler multiple times daily as well as TheraFlu without relief of symptoms. Patient reports difficult control asthma over the last couple months which she's had repeated urgent care visits   Hospital Course:   Acute Asthma exacerbation: With a history of moderate persistent asthma  -  Per patient she has been on different maintenance inhaled corticosteroids in the past however she moved from [place] last year, did establish PCP however her PCP has been out of office due to surgery. In the past, Qvar has worked good. She has been asthmatic since she was a child. She has been on Singulair as well in the past. She only had albuterol inhaler at home and has been using very frequently more than 4 days a week, which qualifies for moderate to severe persistent asthma. No history of intubation in the  past. - Patient was placed on scheduled nebs with Xopenex, Solu-Medrol, Zithromax - She was also placed on Claritin, upon discharge, she was placed on maintenance inhaled corticosteroids on Qvar  - PlaceD on Tessalon, antitussives  - Outpatient follow-up with pulmonology, Dr. Sherene Sires scheduled on May 1st  Hypokalemia: Acute. - Replaced  Essential hypertension - Continue Hyzaar  Sinus tachycardia: Suspect secondary to recent breathing treatments given Improved.   Day of Discharge BP 118/86 (BP Location: Left Arm)   Pulse 96   Temp 97.9 F (36.6 C) (Oral)   Resp 17   Ht 5\' 2"  (1.575 m)   Wt 83.7 kg (184 lb 8 oz)   LMP 09/11/2016   SpO2 94%   BMI 33.75 kg/m   Physical Exam: General: Alert and awake oriented x3 not in any acute distress. HEENT: anicteric sclera, pupils reactive to light and accommodation CVS: S1-S2 clear no murmur rubs or gallops Chest: clear to auscultation bilaterally, no wheezing rales or rhonchi Abdomen: soft nontender, nondistended, normal bowel sounds Extremities: no cyanosis, clubbing or edema noted bilaterally Neuro: Cranial nerves II-XII intact, no focal neurological deficits   The results of significant diagnostics from this hospitalization (including imaging, microbiology, ancillary and laboratory) are listed below for reference.    LAB RESULTS: Basic Metabolic Panel:  Recent Labs Lab 09/15/16 0543 09/16/16 0618  NA 139 137  K 3.5 3.5  CL 105 103  CO2 25 24  GLUCOSE 106* 159*  BUN 6 9  CREATININE 0.79 0.86  CALCIUM 9.3 9.7   Liver Function Tests: No results for input(s): AST, ALT, ALKPHOS, BILITOT, PROT, ALBUMIN in the last 168 hours. No results for input(s): LIPASE, AMYLASE in the last 168 hours. No results for input(s): AMMONIA in the last 168 hours. CBC:  Recent Labs Lab 09/15/16 0543 09/16/16 0618  WBC 8.8 12.6*  HGB 11.3* 13.0  HCT 33.6* 38.7  MCV 83.8 84.7  PLT 237 274   Cardiac Enzymes: No results for input(s):  CKTOTAL, CKMB, CKMBINDEX, TROPONINI in the last 168 hours. BNP: Invalid input(s): POCBNP CBG: No results for input(s): GLUCAP in the last 168 hours.  Significant Diagnostic Studies:  Dg Chest 2 View  Result Date: 09/14/2016 CLINICAL DATA:  Cough since Saturday, trouble breathing for 2 months, history asthma, hypertension, GERD EXAM: CHEST  2 VIEW COMPARISON:  09/14/2016 FINDINGS: Normal heart size, mediastinal contours, and pulmonary vascularity. Lungs clear. No pleural effusion or pneumothorax. Bones unremarkable. IMPRESSION: Normal exam. Electronically Signed   By: Ulyses Southward M.D.   On: 09/14/2016 19:04   Dg Chest 2 View  Result Date: 09/14/2016 CLINICAL DATA:  Congestion and wheezing EXAM: CHEST  2 VIEW COMPARISON:  June 10, 2016 FINDINGS: There is no edema or consolidation. Heart size and pulmonary vascularity are normal. No adenopathy. No bone lesions. IMPRESSION: No edema or consolidation. Electronically Signed   By: Bretta Bang III M.D.   On: 09/14/2016 14:36    2D ECHO:  Disposition and Follow-up: Discharge Instructions    Diet - low sodium heart healthy    Complete by:  As directed    Discharge instructions    Complete by:  As directed    Please continue albuterol inhaler three times a day for 3 days, then as needed after that.   Increase activity slowly    Complete by:  As directed        DISPOSITION:Home   DISCHARGE FOLLOW-UP Follow-up Information    Paula Lot, DO. Schedule an appointment as soon as possible for a visit in 2 week(s).   Specialty:  Family Medicine Contact information: [address] [place] [address] [phone]        Sandrea Hughs, MD Follow up on 10/20/2016.   Specialty:  Pulmonary Disease Why:  at 11:30AM, please arrive at 11:15am for paperwork Contact information: [address] [place] [address] [phone]            Time spent on Discharge: 25 minutes  Signed:   RAI,RIPUDEEP  M.D. [facility] 09/17/2016, 10:59 AM Pager: [phone]

## 2016-09-24 ENCOUNTER — Encounter: Payer: Self-pay | Admitting: Adult Health

## 2016-09-24 ENCOUNTER — Ambulatory Visit (INDEPENDENT_AMBULATORY_CARE_PROVIDER_SITE_OTHER): Payer: 59 | Admitting: Adult Health

## 2016-09-24 VITALS — BP 118/78 | HR 78 | Ht 61.5 in | Wt 191.5 lb

## 2016-09-24 DIAGNOSIS — Z01419 Encounter for gynecological examination (general) (routine) without abnormal findings: Secondary | ICD-10-CM

## 2016-09-24 DIAGNOSIS — J4551 Severe persistent asthma with (acute) exacerbation: Secondary | ICD-10-CM | POA: Diagnosis not present

## 2016-09-24 DIAGNOSIS — Z1231 Encounter for screening mammogram for malignant neoplasm of breast: Secondary | ICD-10-CM

## 2016-09-24 DIAGNOSIS — Z1239 Encounter for other screening for malignant neoplasm of breast: Secondary | ICD-10-CM | POA: Insufficient documentation

## 2016-09-24 NOTE — Patient Instructions (Signed)
Asthma, Adult Asthma is a recurring condition in which the airways tighten and narrow. Asthma can make it difficult to breathe. It can cause coughing, wheezing, and shortness of breath. Asthma episodes, also called asthma attacks, range from minor to life-threatening. Asthma cannot be cured, but medicines and lifestyle changes can help control it. What are the causes? Asthma is believed to be caused by inherited (genetic) and environmental factors, but its exact cause is unknown. Asthma may be triggered by allergens, lung infections, or irritants in the air. Asthma triggers are different for each person. Common triggers include:  Animal dander.  Dust mites.  Cockroaches.  Pollen from trees or grass.  Mold.  Smoke.  Air pollutants such as dust, household cleaners, hair sprays, aerosol sprays, paint fumes, strong chemicals, or strong odors.  Cold air, weather changes, and winds (which increase molds and pollens in the air).  Strong emotional expressions such as crying or laughing hard.  Stress.  Certain medicines (such as aspirin) or types of drugs (such as beta-blockers).  Sulfites in foods and drinks. Foods and drinks that may contain sulfites include dried fruit, potato chips, and sparkling grape juice.  Infections or inflammatory conditions such as the flu, a cold, or an inflammation of the nasal membranes (rhinitis).  Gastroesophageal reflux disease (GERD).  Exercise or strenuous activity.  What are the signs or symptoms? Symptoms may occur immediately after asthma is triggered or many hours later. Symptoms include:  Wheezing.  Excessive nighttime or early morning coughing.  Frequent or severe coughing with a common cold.  Chest tightness.  Shortness of breath.  How is this diagnosed? The diagnosis of asthma is made by a review of your medical history and a physical exam. Tests may also be performed. These may include:  Lung function studies. These tests show how  much air you breathe in and out.  Allergy tests.  Imaging tests such as X-rays.  How is this treated? Asthma cannot be cured, but it can usually be controlled. Treatment involves identifying and avoiding your asthma triggers. It also involves medicines. There are 2 classes of medicine used for asthma treatment:  Controller medicines. These prevent asthma symptoms from occurring. They are usually taken every day.  Reliever or rescue medicines. These quickly relieve asthma symptoms. They are used as needed and provide short-term relief.  Your health care provider will help you create an asthma action plan. An asthma action plan is a written plan for managing and treating your asthma attacks. It includes a list of your asthma triggers and how they may be avoided. It also includes information on when medicines should be taken and when their dosage should be changed. An action plan may also involve the use of a device called a peak flow meter. A peak flow meter measures how well the lungs are working. It helps you monitor your condition. Follow these instructions at home:  Take medicines only as directed by your health care provider. Speak with your health care provider if you have questions about how or when to take the medicines.  Use a peak flow meter as directed by your health care provider. Record and keep track of readings.  Understand and use the action plan to help minimize or stop an asthma attack without needing to seek medical care.  Control your home environment in the following ways to help prevent asthma attacks: ? Do not smoke. Avoid being exposed to secondhand smoke. ? Change your heating and air conditioning filter regularly. ? Limit   and wood stoves.  Get rid of pests (such as roaches and mice) and their droppings.  Throw away plants if you see mold on them.  Clean your floors and dust regularly. Use unscented cleaning products.  Try to have someone  else vacuum for you regularly. Stay out of rooms while they are being vacuumed and for a short while afterward. If you vacuum, use a dust mask from a hardware store, a double-layered or microfilter vacuum cleaner bag, or a vacuum cleaner with a HEPA filter.  Replace carpet with wood, tile, or vinyl flooring. Carpet can trap dander and dust.  Use allergy-proof pillows, mattress covers, and box spring covers.  Wash bed sheets and blankets every week in hot water and dry them in a dryer.  Use blankets that are made of polyester or cotton.  Clean bathrooms and kitchens with bleach. If possible, have someone repaint the walls in these rooms with mold-resistant paint. Keep out of the rooms that are being cleaned and painted.  Wash hands frequently. Contact a health care provider if:  You have wheezing, shortness of breath, or a cough even if taking medicine to prevent attacks.  The colored mucus you cough up (sputum) is thicker than usual.  Your sputum changes from clear or white to yellow, green, gray, or bloody.  You have any problems that may be related to the medicines you are taking (such as a rash, itching, swelling, or trouble breathing).  You are using a reliever medicine more than 2-3 times per week.  Your peak flow is still at 50-79% of your personal best after following your action plan for 1 hour.  You have a fever. Get help right away if:  You seem to be getting worse and are unresponsive to treatment during an asthma attack.  You are short of breath even at rest.  You get short of breath when doing very little physical activity.  You have difficulty eating, drinking, or talking due to asthma symptoms.  You develop chest pain.  You develop a fast heartbeat.  You have a bluish color to your lips or fingernails.  You are light-headed, dizzy, or faint.  Your peak flow is less than 50% of your personal best. This information is not intended to replace advice given to  you by your health care provider. Make sure you discuss any questions you have with your health care provider. Document Released: 06/08/2005 Document Revised: 11/20/2015 Document Reviewed: 01/05/2013 Elsevier Interactive Patient Education  2017 [employer].  Continue all medications as directed. Continue excellent water consumption. Will call when lab results are available. Please keep your 10/20/16 Pulmonology appointment with Dr. Sherene Sires. Please call clinic with any questions/concerns.

## 2016-09-24 NOTE — Assessment & Plan Note (Signed)
Acute Asthma exacerbation: With a history of moderate persistent asthma - Per patient she has been on different maintenance inhaled corticosteroids in the past however she moved from [place] last year, did establish PCP however her PCP has been out of office due to surgery. In the past, Qvar has worked good. She has been asthmatic since she was a child. She has been on Singulair as well in the past. She only had albuterol inhaler at home and has been using very frequently more than 4 days a week, which qualifies for moderate to severe persistent asthma. No history of intubation in the past. - Patient was placed on scheduled nebs with Xopenex,Solu-Medrol, Zithromax - She was also placed on Claritin, upon discharge, she was placed on maintenance inhaled corticosteroids on Qvar  - PlaceD on Tessalon, antitussives  - Outpatient follow-up with pulmonology, Dr. Sherene Sires scheduled on May 1st  Continue all medications as directed. F/u with Pulmonologist.

## 2016-09-24 NOTE — Progress Notes (Signed)
Subjective:    Patient ID: Paula Lambert, female    DOB: 10/26/1970, 46 y.o.   MRN: [id number]  HPI:  Paula Lambert presents for f/u after recent hospitalization for  "Acute Asthma exacerbation: With a history of moderate persistent asthma - Per patient she has been on different maintenance inhaled corticosteroids in the past however she moved from [place] last year, did establish PCP however her PCP has been out of office due to surgery. In the past, Qvar has worked good. She has been asthmatic since she was a child. She has been on Singulair as well in the past. She only had albuterol inhaler at home and has been using very frequently more than 4 days a week, which qualifies for moderate to severe persistent asthma. No history of intubation in the past. - Patient was placed on scheduled nebs with Xopenex,Solu-Medrol, Zithromax - She was also placed on Claritin, upon discharge, she was placed on maintenance inhaled corticosteroids on Qvar  - PlaceD on Tessalon, antitussives  - Outpatient follow-up with pulmonology, Dr. Sherene Sires scheduled on May 1st" She is compliant on all medications, denies dyspnea and last wheezing was noted 09/21/2016.  Patient Care Team    Relationship Specialty Notifications Start End  Thomasene Lot, DO PCP - General Family Medicine  06/05/16     Patient Active Problem List   Diagnosis Date Noted  . Hypokalemia 09/15/2016  . Sinus tachycardia 09/15/2016  . Asthma exacerbation 09/14/2016  . Encounter for wellness examination 07/09/2016  . Pre-diabetes/ glucose intolerance  (new onset)  06/20/2016  . Elevated LDL cholesterol level 06/20/2016  . Vaccination not carried out because of patient refusal 06/20/2016  . Obesity, Class I, BMI 30-34.9 06/20/2016  . Vitamin D deficiency 06/19/2016  . Essential hypertension 06/05/2016  . Asthma 06/05/2016  . Sleep apnea 06/05/2016  . Heart murmur 06/05/2016  . GERD (gastroesophageal reflux disease) 06/05/2016  .  Environmental and seasonal allergies 06/05/2016     Past Medical History:  Diagnosis Date  . Asthma   . GERD (gastroesophageal reflux disease)   . Heart murmur    leaking valve  . Hypertension   . Sleep apnea      Past Surgical History:  Procedure Laterality Date  . BREAST SURGERY     reduction  . LIPOMA EXCISION    . TENDON RELEASE Right    elbow  . TONSILLECTOMY       Family History  Problem Relation Age of Onset  . Aneurysm Mother   . Stroke Father   . Hypertension Father   . Prostate cancer Father   . Diabetes Father   . Aneurysm Maternal Uncle   . Stroke Maternal Grandmother   . Hypertension Maternal Grandmother   . Heart disease Maternal Grandmother   . Diabetes Maternal Grandmother   . Aneurysm Maternal Grandfather   . Stroke Paternal Grandmother      History  Drug Use No     History  Alcohol Use  . Yes    Comment: occasionally     History  Smoking Status  . Never Smoker  Smokeless Tobacco  . Never Used     Outpatient Encounter Prescriptions as of 09/24/2016  Medication Sig  . albuterol (PROVENTIL HFA;VENTOLIN HFA) 108 (90 Base) MCG/ACT inhaler Inhale 2 puffs into the lungs every 6 (six) hours as needed for wheezing or shortness of breath.  . benzonatate (TESSALON) 100 MG capsule Take 1 capsule (100 mg total) by mouth 3 (three) times daily as needed for  cough. For cough  . Cholecalciferol (VITAMIN D-3) 5000 units TABS Take 1 tablet by mouth daily.  Marland Kitchen FLOVENT HFA 110 MCG/ACT inhaler Inhale 2 puffs into the lungs 2 (two) times daily.  Marland Kitchen loratadine (CLARITIN) 10 MG tablet Take 1 tablet (10 mg total) by mouth daily.  Marland Kitchen losartan-hydrochlorothiazide (HYZAAR) 50-12.5 MG tablet Take 1 tablet by mouth daily.  . montelukast (SINGULAIR) 10 MG tablet Take 1 tablet (10 mg total) by mouth at bedtime.  Marland Kitchen omeprazole (PRILOSEC) 20 MG capsule Take 1 capsule (20 mg total) by mouth daily. Any generic PPI is okay. Please take prilosec while you are on  prednisone  . predniSONE (DELTASONE) 50 MG tablet taper from 4 doses each day to 1 dose and stop.  . Vitamin D, Ergocalciferol, (DRISDOL) 50000 units CAPS capsule Take 50,000 Units by mouth once a week.  . [DISCONTINUED] predniSONE (DELTASONE) 10 MG tablet Prednisone dosing: Take  Prednisone  (4 tabs) x 3 days, then taper to  (3 tabs) x 3 days, then  (2 tabs) x 3days, then  (1 tab) x 3days, then OFF.  . [DISCONTINUED] azithromycin (ZITHROMAX) 500 MG tablet Take 1 tablet (500 mg total) by mouth daily.  . [DISCONTINUED] beclomethasone (QVAR) 80 MCG/ACT inhaler Inhale 2 puffs into the lungs 2 (two) times daily.  . [DISCONTINUED] guaiFENesin-codeine 100-10 MG/5ML syrup Take 5 mLs by mouth every 6 (six) hours as needed for cough.   No facility-administered encounter medications on file as of 09/24/2016.     Allergies: Ultram [tramadol hcl]; Amoxicillin; Penicillins; Sulfa antibiotics; and Other  Body mass index is 35.6 kg/m.  Blood pressure 118/78, pulse 78, height 5' 1.5" (1.562 m), weight 191 lb 8 oz (86.9 kg), last menstrual period 09/11/2016, SpO2 97 %.  Review of Systems  Constitutional: Positive for fatigue. Negative for activity change, appetite change, chills, diaphoresis, fever and unexpected weight change.  HENT: Positive for congestion and postnasal drip.   Eyes: Negative for visual disturbance.  Respiratory: Positive for cough. Negative for apnea, choking, chest tightness, shortness of breath, wheezing and stridor.   Cardiovascular: Negative for chest pain, palpitations and leg swelling.  Skin: Negative for color change, pallor, rash and wound.  Hematological: Does not bruise/bleed easily.       Objective:   Physical Exam  Constitutional: She is oriented to person, place, and time. She appears well-developed and well-nourished. No distress.  HENT:  Head: Normocephalic and atraumatic.  Eyes: Conjunctivae are normal. Pupils are equal, round, and reactive to  light.  Neck: Normal range of motion. Neck supple.  Cardiovascular: Normal rate, regular rhythm and intact distal pulses.   No murmur heard. Pulmonary/Chest: Effort normal and breath sounds normal. No respiratory distress. She has no wheezes. She has no rales.  Lymphadenopathy:    She has no cervical adenopathy.  Neurological: She is alert and oriented to person, place, and time.  Skin: Skin is warm and dry. No rash noted. She is not diaphoretic. No erythema. No pallor.  Psychiatric: She has a normal mood and affect. Her behavior is normal. Judgment and thought content normal.  Nursing note and vitals reviewed.         Assessment & Plan:   1. Screening for breast cancer   2. Encounter for annual routine gynecological examination   3. Severe persistent asthma with acute exacerbation   4. Severe persistent asthma with exacerbation     Asthma exacerbation Acute Asthma exacerbation: With a history of moderate persistent asthma - Per patient she has  been on different maintenance inhaled corticosteroids in the past however she moved from [place] last year, did establish PCP however her PCP has been out of office due to surgery. In the past, Qvar has worked good. She has been asthmatic since she was a child. She has been on Singulair as well in the past. She only had albuterol inhaler at home and has been using very frequently more than 4 days a week, which qualifies for moderate to severe persistent asthma. No history of intubation in the past. - Patient was placed on scheduled nebs with Xopenex,Solu-Medrol, Zithromax - She was also placed on Claritin, upon discharge, she was placed on maintenance inhaled corticosteroids on Qvar  - PlaceD on Tessalon, antitussives  - Outpatient follow-up with pulmonology, Dr. Sherene Sires scheduled on May 1st  Continue all medications as directed. F/u with Pulmonologist.     FOLLOW-UP:  Return if symptoms worsen or fail to improve.

## 2016-09-25 LAB — BASIC METABOLIC PANEL
BUN/Creatinine Ratio: 12 (ref 9–23)
BUN: 11 mg/dL (ref 6–24)
CALCIUM: 9.5 mg/dL (ref 8.7–10.2)
CO2: 29 mmol/L (ref 18–29)
CREATININE: 0.95 mg/dL (ref 0.57–1.00)
Chloride: 95 mmol/L — ABNORMAL LOW (ref 96–106)
GFR, EST AFRICAN AMERICAN: 84 mL/min/{1.73_m2} (ref >59)
GFR, EST NON AFRICAN AMERICAN: 73 mL/min/{1.73_m2} (ref >59)
Glucose: 101 mg/dL — ABNORMAL HIGH (ref 65–99)
Potassium: 4.5 mmol/L (ref 3.5–5.2)
SODIUM: 140 mmol/L (ref 134–144)

## 2016-09-25 LAB — CBC WITH DIFFERENTIAL/PLATELET
BASOS: 0 %
Basophils Absolute: 0 10*3/uL (ref 0.0–0.2)
EOS (ABSOLUTE): 0 10*3/uL (ref 0.0–0.4)
EOS: 0 %
HEMATOCRIT: 40.6 % (ref 34.0–46.6)
HEMOGLOBIN: 13.2 g/dL (ref 11.1–15.9)
Immature Grans (Abs): 0 10*3/uL (ref 0.0–0.1)
Immature Granulocytes: 0 %
LYMPHS ABS: 4.9 10*3/uL — AB (ref 0.7–3.1)
Lymphs: 37 %
MCH: 28.6 pg (ref 26.6–33.0)
MCHC: 32.5 g/dL (ref 31.5–35.7)
MCV: 88 fL (ref 79–97)
MONOCYTES: 8 %
Monocytes Absolute: 1.1 10*3/uL — ABNORMAL HIGH (ref 0.1–0.9)
Neutrophils Absolute: 7.1 10*3/uL — ABNORMAL HIGH (ref 1.4–7.0)
Neutrophils: 55 %
Platelets: 288 10*3/uL (ref 150–379)
RBC: 4.62 x10E6/uL (ref 3.77–5.28)
RDW: 15.4 % (ref 12.3–15.4)
WBC: 13.1 10*3/uL — AB (ref 3.4–10.8)

## 2016-09-29 ENCOUNTER — Other Ambulatory Visit: Payer: Self-pay | Admitting: Adult Health

## 2016-09-29 DIAGNOSIS — Z1231 Encounter for screening mammogram for malignant neoplasm of breast: Secondary | ICD-10-CM

## 2016-10-15 ENCOUNTER — Ambulatory Visit: Payer: 59 | Admitting: Family Medicine

## 2016-10-15 DIAGNOSIS — Z01419 Encounter for gynecological examination (general) (routine) without abnormal findings: Secondary | ICD-10-CM | POA: Diagnosis not present

## 2016-10-15 DIAGNOSIS — Z124 Encounter for screening for malignant neoplasm of cervix: Secondary | ICD-10-CM | POA: Diagnosis not present

## 2016-10-20 ENCOUNTER — Encounter: Payer: Self-pay | Admitting: Internal Medicine

## 2016-10-20 ENCOUNTER — Ambulatory Visit (INDEPENDENT_AMBULATORY_CARE_PROVIDER_SITE_OTHER): Payer: 59 | Admitting: Internal Medicine

## 2016-10-20 VITALS — BP 114/80 | HR 95 | Ht 62.0 in | Wt 182.0 lb

## 2016-10-20 DIAGNOSIS — J45991 Cough variant asthma: Secondary | ICD-10-CM

## 2016-10-20 LAB — NITRIC OXIDE: Nitric Oxide: 15

## 2016-10-20 NOTE — Progress Notes (Signed)
Subjective:     Patient ID: Paula Lambert, female   DOB: [birth year],     MRN: [id number]  HPI  46 yobf never smoker with ? EIA while playing [employer] never 100% better and mostly on saba but has tried advair / symbicort /Breo of the three BREO worked the best but could not afford and now on flovent hfa since last admit which occurred p ran out of ICS x 6 months   Admit date: 09/14/2016 Discharge date: 09/17/2016  Primary Care Physician:  Thomasene Lot, DO  Discharge Diagnoses:        . ACUTE Asthma exacerbation . Hypokalemia . Essential hypertension . Sinus tachycardia   Consults: NONE   Recommendations for Outpatient Follow-up:  1. Please repeat CBC/BMET at next visit 2. Pulmonology follow-up scheduled outpatient   DIET: Heart healthy diet    Allergies:        Allergies  Allergen Reactions  . Ultram [Tramadol Hcl] Other (See Comments)    syncope  . Amoxicillin Hives and Swelling  . Penicillins Hives and Swelling  . Sulfa Antibiotics Hives  . Other Other (See Comments)    PEANUTS AND ALMONDS     DISCHARGE MEDICATIONS:    Current Discharge Medication List       START taking these medications   Details  azithromycin (ZITHROMAX) 500 MG tablet Take 1 tablet (500 mg total) by mouth daily. Qty: 5 tablet, Refills: 0    beclomethasone (QVAR) 80 MCG/ACT inhaler Inhale 2 puffs into the lungs 2 (two) times daily. Qty: 1 Inhaler, Refills: 12    benzonatate (TESSALON) 100 MG capsule Take 1 capsule (100 mg total) by mouth 3 (three) times daily as needed for cough. For cough Qty: 30 capsule, Refills: 0    guaiFENesin-codeine 100-10 MG/5ML syrup Take 5 mLs by mouth every 6 (six) hours as needed for cough. Qty: 120 mL, Refills: 0    loratadine (CLARITIN) 10 MG tablet Take 1 tablet (10 mg total) by mouth daily. Qty: 30 tablet, Refills: 0    montelukast (SINGULAIR) 10 MG tablet Take 1 tablet (10 mg total) by mouth at bedtime. Qty: 30 tablet,  Refills: 3    omeprazole (PRILOSEC) 20 MG capsule Take 1 capsule (20 mg total) by mouth daily. Any generic PPI is okay. Please take prilosec while you are on prednisone Qty: 30 capsule, Refills: 0    predniSONE (DELTASONE) 10 MG tablet Prednisone dosing: Take  Prednisone  (4 tabs) x 3 days, then taper to  (3 tabs) x 3 days, then  (2 tabs) x 3days, then  (1 tab) x 3days, then OFF. Qty: 30 tablet, Refills: 0      CONTINUE these medications which have NOT CHANGED   Details  albuterol (PROVENTIL HFA;VENTOLIN HFA) 108 (90 Base) MCG/ACT inhaler Inhale 2 puffs into the lungs every 6 (six) hours as needed for wheezing or shortness of breath.    Cholecalciferol (VITAMIN D-3) 5000 units TABS Take 1 tablet by mouth daily.    losartan-hydrochlorothiazide (HYZAAR) 50-12.5 MG tablet Take 1 tablet by mouth daily. Qty: 90 tablet, Refills: 3    Vitamin D, Ergocalciferol, (DRISDOL) 50000 units CAPS capsule Take 50,000 Units by mouth once a week.         Brief H and P: For complete details please refer to admission H and P, but in brief Paula Lambert a 46 y.o.femalewith medical history significant of HTN and asthma; whopresents with complaints of shortness of breath for the last 3-4 days. Symptoms initially started  with sore throat and sinus congestion. Now reporting productive cough with clear sputum and wheezing. Coughing spells seem to make symptoms worse. Reports utilizing her home inhaler multiple times daily as well as TheraFlu without relief of symptoms. Patient reports difficult control asthma over the last couple months which she's had repeated urgent care visits   Hospital Course:   Acute Asthma exacerbation: With a history of moderate persistent asthma - Per patient she has been on different maintenance inhaled corticosteroids in the past however she moved from [place] last year, did establish PCP however her PCP has been out of office due to surgery.  In the past, Qvar has worked good. She has been asthmatic since she was a child. She has been on Singulair as well in the past. She only had albuterol inhaler at home and has been using very frequently more than 4 days a week, which qualifies for moderate to severe persistent asthma. No history of intubation in the past. - Patient was placed on scheduled nebs with Xopenex,Solu-Medrol, Zithromax - She was also placed on Claritin, upon discharge, she was placed on maintenance inhaled corticosteroids on Qvar  - PlaceD on Tessalon, antitussives  - Outpatient follow-up with pulmonology, Dr. Sherene Sires scheduled on May 1st  Hypokalemia: Acute. - Replaced  Essential hypertension - Continue Hyzaar  Sinus tachycardia: Suspect secondary to recent breathing treatments given Improved.     10/20/2016 1st [facility] office visit/ Kerri-Anne Haeberle  flovent 110 hf 2 bid/ singulair  Chief Complaint  Patient presents with  . Pulmonary Consult    Referred by Dr. Denton Ar. Pt states she has had asthma as a child. She has been having worsening symptoms of cough and wheezing since Fall 2017. She states today she is feeling well and has no respiratory symptoms.   after last running out of ICS due to  cost, moved to [place] and made it about 6 months and rare for need for albterol but then felt caught cold late March 2018 albuterol up to 4 puffs a day for sev weeks before [place] but now back to baseline and no need for saba at all on flovent 110/ singulair   No obvious day to day or daytime variability or assoc excess/ purulent sputum or mucus plugs or hemoptysis or cp or chest tightness, subjective wheeze or overt sinus or hb symptoms. No unusual exp hx or h/o childhood pna/ asthma or knowledge of premature birth.  Sleeping ok without nocturnal  or early am exacerbation  of respiratory  c/o's or need for noct saba. Also denies any obvious fluctuation of symptoms with weather or environmental changes or other  aggravating or alleviating factors except as outlined above   Current Medications, Allergies, Complete Past Medical History, Past Surgical History, Family History, and Social History were reviewed in [employer] record.  ROS  The following are not active complaints unless bolded sore throat, dysphagia, dental problems, itching, sneezing,  nasal congestion or excess/ purulent secretions, ear ache,   fever, chills, sweats, unintended wt loss, classically pleuritic or exertional cp,  orthopnea pnd or leg swelling, presyncope, palpitations, abdominal pain, anorexia, nausea, vomiting, diarrhea  or change in bowel or bladder habits, change in stools or urine, dysuria,hematuria,  rash, arthralgias, visual complaints, headache, numbness, weakness or ataxia or problems with walking or coordination,  change in mood/affect or memory.               Review of Systems     Objective:  Physical Exam Pleasant amb bf nad   Wt Readings from Last 3 Encounters:  10/20/16 182 lb (82.6 kg)  09/24/16 191 lb 8 oz (86.9 kg)  09/16/16 184 lb 8 oz (83.7 kg)    Vital signs reviewed    HEENT: nl dentition,   and oropharynx. Nl external ear canals without cough reflex - moderate bilateral non-specific turbinate edema     NECK :  without JVD/Nodes/TM/ nl carotid upstrokes bilaterally   LUNGS: no acc muscle use,  Nl contour chest which is clear to A and P bilaterally without cough on insp or exp maneuvers   CV:  RRR  no s3 or murmur or increase in P2, and no edema   ABD:  soft and nontender with nl inspiratory excursion in the supine position. No bruits or organomegaly appreciated, bowel sounds nl  MS:  Nl gait/ ext warm without deformities, calf tenderness, cyanosis or clubbing No obvious joint restrictions   SKIN: warm and dry without lesions    NEURO:  alert, approp, nl sensorium with  no motor or cerebellar deficits apparent.        Assessment:

## 2016-10-20 NOTE — Patient Instructions (Addendum)
Plan A = Automatic = singulair 10 mg daily / Flovent hfa 110 Take 2 puffs first thing in am and then another 2 puffs about 12 hours later.    Work on inhaler technique:  relax and gently blow all the way out then take a nice smooth deep breath back in, triggering the inhaler at same time you start breathing in.  Hold for up to 5 seconds if you can. Blow out thru nose. Rinse and gargle with water when done      Plan B = Backup Only use your albuterol (Proair) as a rescue medication to be used if you can't catch your breath by resting or doing a relaxed purse lip breathing pattern.  - The less you use it, the better it will work when you need it. - Ok to use the inhaler up to 2 puffs  every 4 hours if you must but call for appointment if use goes up over your usual need - Don't leave home without it !!  (think of it like the spare tire for your car)    Please schedule a follow up visit in 3 months but call sooner if needed

## 2016-10-21 NOTE — Assessment & Plan Note (Addendum)
FENO 10/20/2016  =   16 on flovent 110 and singulair  - 10/20/2016  After extensive coaching HFA effectiveness =    75% from a baseline of 50% (short Ti)   All goals of chronic asthma control met including optimal function and elimination of symptoms with minimal need for rescue therapy.  Contingencies discussed in full including contacting this office immediately if not controlling the symptoms using the rule of two's.     DDX of  difficult airways management almost all start with A and  include Adherence, Ace Inhibitors, Acid Reflux, Active Sinus Disease, Alpha 1 Antitripsin deficiency, Anxiety masquerading as Airways dz,  ABPA,  Allergy(esp in young), Aspiration (esp in elderly), Adverse effects of meds,  Active smokers, A bunch of PE's (a small clot burden can't cause this syndrome unless there is already severe underlying pulm or vascular dz with poor reserve) plus two Bs  = Bronchiectasis and Beta blocker use..and one C= CHF  Adherence is always the initial "prime suspect" and is a multilayered concern that requires a "trust but verify" approach in every patient - starting with knowing how to use medications, especially inhalers, correctly, keeping up with refills and understanding the fundamental difference between maintenance and prns vs those medications only taken for a very short course and then stopped and not refilled.  - see hfa teaching - Formulary restrictions will be an ongoing challenge for the forseable future and I would be happy to pick an alternative if the pt will first  provide me a list of them but pt  will need to return here for training for any new device that is required eg dpi vs hfa vs respimat.    In meantime we can always provide samples so the patient never runs out of any needed respiratory medications.  - needs to always return with all meds in hand using a trust but verify approach to confirm accurate Medication  Reconciliation The principal here is that until we are  certain that the  patients are doing what we've asked, it makes no sense to ask them to do more.    ? Allergy component :  Moot issue for now as long as remains so well controlled but should blow the flovent out thru the nose to get additional benefit to this target   ? Active sinus dz:  Same point / defer w/u for now   Rule of two's reviewed   Total time devoted to counseling  > 50 % of initial 60 min office visit:  review case with pt/ discussion of options/alternatives/ personally creating written customized instructions  in presence of pt  then going over those specific  Instructions directly with the pt including how to use all of the meds but in particular covering each new medication in detail and the difference between the maintenance= "automatic" meds and the prns using an action plan format for the latter (If this problem/symptom => do that organization reading Left to right).  Please see AVS from this visit for a full list of these instructions which I personally wrote for this pt and  are unique to this visit.

## 2016-10-21 NOTE — Assessment & Plan Note (Signed)
Body mass index is 33.29 kg/m.  -  trending down/ encouraged Lab Results  Component Value Date   TSH 2.65 06/10/2016     Contributing to gerd risk/ doe/reviewed the need and the process to achieve and maintain neg calorie balance > defer f/u primary care including intermittently monitoring thyroid status

## 2016-10-27 ENCOUNTER — Telehealth: Payer: Self-pay | Admitting: Internal Medicine

## 2016-10-27 NOTE — Telephone Encounter (Signed)
Spoke with the pt and got her permission to [facility]  Nash Dimmeralled Kim and [facility]

## 2016-10-28 NOTE — Telephone Encounter (Signed)
Spoke with Selena BattenKim and answered the questions she had about pt's most recent visit. Selena BattenKim states she has tried to reach pt but thinks pt might not recognize her number. She asked if we spoke with pt to provide her with her number and tell her she can call her if needed.   lmom tcb x1 to pt

## 2016-10-28 NOTE — Telephone Encounter (Signed)
Patient returned call.  Advised per note she just needs to contact Baird KayKim, [facility] nurse.  She states she has her message and number and has been in meetings and will call her back.  No call back is needed. May close message.

## 2016-10-28 NOTE — Telephone Encounter (Signed)
lmom tcb x2 to kim 

## 2016-10-29 ENCOUNTER — Ambulatory Visit: Payer: 59 | Admitting: Family Medicine

## 2016-11-24 ENCOUNTER — Ambulatory Visit (INDEPENDENT_AMBULATORY_CARE_PROVIDER_SITE_OTHER): Payer: 59 | Admitting: Family Medicine

## 2016-11-24 ENCOUNTER — Encounter: Payer: Self-pay | Admitting: Family Medicine

## 2016-11-24 VITALS — BP 124/85 | HR 71 | Wt 190.0 lb

## 2016-11-24 DIAGNOSIS — E669 Obesity, unspecified: Secondary | ICD-10-CM | POA: Diagnosis not present

## 2016-11-24 DIAGNOSIS — M722 Plantar fascial fibromatosis: Secondary | ICD-10-CM

## 2016-11-24 NOTE — Patient Instructions (Signed)
Please give patient handout from sports medicine advisor on plantar fasciitis.  Please use good supportive shoes at all times that have good arch support.  If you go to the [employer] as we discussed they will have inserts in her shoes that have good support for people with plantar fasciitis.  I recommend using a frozen water bottle and ruling out along the bottom of her foot for 15-20 minutes 4-5 times per day.  Also use a towel and stretch your foot as displayed in the handouts I provided.  You can use Advil or Aleve when necessary for pain.        Plantar Fasciitis Plantar fasciitis is a painful foot condition that affects the heel. It occurs when the band of tissue that connects the toes to the heel bone (plantar fascia) becomes irritated. This can happen after exercising too much or doing other repetitive activities (overuse injury). The pain from plantar fasciitis can range from mild irritation to severe pain that makes it difficult for you to walk or move. The pain is usually worse in the morning or after you have been sitting or lying down for a while. What are the causes? This condition may be caused by:  Standing for long periods of time.  Wearing shoes that do not fit.  Doing high-impact activities, including running, aerobics, and ballet.  Being overweight.  Having an abnormal way of walking (gait).  Having tight calf muscles.  Having high arches in your feet.  Starting a new athletic activity.  What are the signs or symptoms? The main symptom of this condition is heel pain. Other symptoms include:  Pain that gets worse after activity or exercise.  Pain that is worse in the morning or after resting.  Pain that goes away after you walk for a few minutes.  How is this diagnosed? This condition may be diagnosed based on your signs and symptoms. Your health care provider will also do a physical exam to check for:  A tender area on the bottom of your  foot.  A high arch in your foot.  Pain when you move your foot.  Difficulty moving your foot.  You may also need to have imaging studies to confirm the diagnosis. These can include:  X-rays.  Ultrasound.  MRI.  How is this treated? Treatment for plantar fasciitis depends on the severity of the condition. Your treatment may include:  Rest, ice, and over-the-counter pain medicines to manage your pain.  Exercises to stretch your calves and your plantar fascia.  A splint that holds your foot in a stretched, upward position while you sleep (night splint).  Physical therapy to relieve symptoms and prevent problems in the future.  Cortisone injections to relieve severe pain.  Extracorporeal shock wave therapy (ESWT) to stimulate damaged plantar fascia with electrical impulses. It is often used as a last resort before surgery.  Surgery, if other treatments have not worked after 12 months.  Follow these instructions at home:  Take medicines only as directed by your health care provider.  Avoid activities that cause pain.  Roll the bottom of your foot over a bag of ice or a bottle of cold water. Do this for 20 minutes, 3-4 times a day.  Perform simple stretches as directed by your health care provider.  Try wearing athletic shoes with air-sole or gel-sole cushions or soft shoe inserts.  Wear a night splint while sleeping, if directed by your health care provider.  Keep all follow-up appointments  with your health care provider. How is this prevented?  Do not perform exercises or activities that cause heel pain.  Consider finding low-impact activities if you continue to have problems.  Lose weight if you need to. The best way to prevent plantar fasciitis is to avoid the activities that aggravate your plantar fascia. Contact a health care provider if:  Your symptoms do not go away after treatment with home care measures.  Your pain gets worse.  Your pain affects your  ability to move or do your daily activities. This information is not intended to replace advice given to you by your health care provider. Make sure you discuss any questions you have with your health care provider. Document Released: 03/03/2001 Document Revised: 11/11/2015 Document Reviewed: 04/18/2014 Elsevier Interactive Patient Education  [employer]2018 [employer] Inc[employer]

## 2016-11-24 NOTE — Progress Notes (Signed)
Pt here for an acute care OV today   Impression and Recommendations:    1. Plantar fasciitis of right foot   2. Obesity, Class I, BMI 30-34.9     Plantar fasciitis of right foot Please give patient handout from sports medicine advisor on plantar fasciitis.  Please use good supportive shoes at all times that have good arch support.  If you go to the [employer] [facility] as we discussed they will have inserts in her shoes that have good support for people with plantar fasciitis.  I recommend using a frozen water bottle and ruling out along the bottom of her foot for 15-20 minutes 4-5 times per day.  Also use a towel and stretch your foot as displayed in the handouts I provided.  You can use Advil or Aleve when necessary for pain.   Obesity, Class I, BMI 30-34.9 Wt loss would help relieve sx.  Counseling done   The patient was counseled, risk factors were discussed, anticipatory guidance given.   Gross side effects, risk and benefits, and alternatives of medications and treatment plan in general discussed with patient.  Patient is aware that all medications have potential side effects and we are unable to predict every side effect or drug-drug interaction that may occur.   Patient will call with any questions prior to using medication if they have concerns.  Expresses verbal understanding and consents to current therapy and treatment regimen.  No barriers to understanding were identified.  Red flag symptoms and signs discussed in detail.  Patient expressed understanding regarding what to do in case of emergency\urgent symptoms  Please see AVS handed out to patient at the end of our visit for further patient instructions/ counseling done pertaining to today's office visit.   Return for Follow-up for chronic care in the near future.  Please come fasting in case we want to obtain blood .     Note: This document was prepared using Dragon voice recognition software and may include  unintentional dictation errors.  Paula Lambert  --------------------------------------------------------------------------------------------------------------------------------------------------------------------------------------------------------------------------------------------    Subjective:    CC:  Chief Complaint  Patient presents with  . Foot Injury    right side, denies injury    HPI: Paula Lambert is a 46 y.o. female who [facility] for issues as discussed below.   Noticed R foot pain while walking dog 3-4 days ago, no injury.  Hurts to walk/ wtr bear on it first thing in the am.   Eases up as she moves around in the daytime.   Feels like it is swollen a little.   Has been walking more than usual trying to lose wt.  Wearing poorly supportive shoes.  Most of the time wears sneakers. Tried- nothing excpt advil and rubbing it.    Wt Readings from Last 3 Encounters:  11/24/16 190 lb (86.2 kg)  10/20/16 182 lb (82.6 kg)  09/24/16 191 lb 8 oz (86.9 kg)   BP Readings from Last 3 Encounters:  11/24/16 124/85  10/20/16 114/80  09/24/16 118/78   BMI Readings from Last 3 Encounters:  11/24/16 34.75 kg/m  10/20/16 33.29 kg/m  09/24/16 35.60 kg/m     Patient Care Team    Relationship Specialty Notifications Start End  Paula Lotpalski, Leontine Radman, DO PCP - General Family Medicine  06/05/16      Patient Active Problem List   Diagnosis Date Noted  . Pre-diabetes/ glucose intolerance  (new onset)  06/20/2016  Priority: High  . Elevated LDL cholesterol level 06/20/2016    Priority: High  . Morbid obesity due to excess calories ([name]) 06/20/2016    Priority: High  . Essential hypertension 06/05/2016    Priority: High  . Vaccination not carried out because of patient refusal 06/20/2016    Priority: Medium  . Cough variant asthma 06/05/2016    Priority: Medium  . Sleep apnea 06/05/2016    Priority: Medium  . Heart murmur  06/05/2016    Priority: Medium  . GERD (gastroesophageal reflux disease) 06/05/2016    Priority: Medium  . Vitamin D deficiency 06/19/2016    Priority: Low  . Environmental and seasonal allergies 06/05/2016    Priority: Low  . Obesity, Class I, BMI 30-34.9 11/24/2016  . Plantar fasciitis of right foot 11/24/2016  . Screening for breast cancer 09/24/2016  . Hypokalemia 09/15/2016  . Sinus tachycardia 09/15/2016  . Asthma exacerbation 09/14/2016  . Encounter for annual routine gynecological examination 07/09/2016    Past Medical history, Surgical history, Family history, Social history, Allergies and Medications have been entered into the medical record, reviewed and changed as needed.    Current Meds  Medication Sig  . albuterol (PROVENTIL HFA;VENTOLIN HFA) 108 (90 Base) MCG/ACT inhaler Inhale 2 puffs into the lungs every 6 (six) hours as needed for wheezing or shortness of breath.  . Cholecalciferol (VITAMIN D-3) 5000 units TABS Take 1 tablet by mouth daily.  Marland Kitchen FLOVENT HFA 110 MCG/ACT inhaler Inhale 2 puffs into the lungs 2 (two) times daily.  Marland Kitchen loratadine (CLARITIN) 10 MG tablet Take 1 tablet (10 mg total) by mouth daily.  Marland Kitchen losartan-hydrochlorothiazide (HYZAAR) 50-12.5 MG tablet Take 1 tablet by mouth daily.  . montelukast (SINGULAIR) 10 MG tablet Take 1 tablet (10 mg total) by mouth at bedtime.  . Vitamin D, Ergocalciferol, (DRISDOL) 50000 units CAPS capsule Take 50,000 Units by mouth once a week.    Allergies:  Allergies  Allergen Reactions  . Ultram [Tramadol Hcl] Other (See Comments)    syncope  . Amoxicillin Hives and Swelling  . Penicillins Hives and Swelling  . Sulfa Antibiotics Hives  . Sesame Oil Hives  . Other Other (See Comments)    PEANUTS AND ALMONDS     Review of Systems: General:   Denies fever, chills, unexplained weight loss.  Optho/Auditory:   Denies visual changes, blurred vision/LOV Respiratory:   Denies wheeze, DOE more than baseline levels.    Cardiovascular:   Denies chest pain, palpitations, new onset peripheral edema  Gastrointestinal:   Denies nausea, vomiting, diarrhea, abd pain.  Genitourinary: Denies dysuria, freq/ urgency, flank pain or discharge from genitals.  Endocrine:     Denies hot or cold intolerance, polyuria, polydipsia. Musculoskeletal:   Denies unexplained myalgias, joint swelling, unexplained arthralgias, gait problems.  Skin:  Denies new onset rash, suspicious lesions Neurological:     Denies dizziness, unexplained weakness, numbness  Psychiatric/Behavioral:   Denies mood changes, suicidal or homicidal ideations, hallucinations    Objective:   Blood pressure 124/85, pulse 71, weight 190 lb (86.2 kg). Body mass index is 34.75 kg/m. General:  Well Developed, well nourished, appropriate for stated age.  Neuro:  Alert and oriented,  extra-ocular muscles intact  HEENT:  Normocephalic, atraumatic, neck supple Skin:  no gross rash, warm, pink. Cardiac:  RRR, S1 S2 Respiratory:  ECTA B/L and A/P, Not using accessory muscles, speaking in full sentences- unlabored. Vascular:  Ext warm, no cyanosis apprec.; cap RF less 2 sec. Psych:  No  HI/SI, judgement and insight good, Euthymic mood. Full Affect. R Foot: No visible erythema or swelling. Range of motion is full in all directions. Strength is 5/5 in all directions. No hallux valgus. No pes cavus or pes planus. + mild calcaneal callus noted. No pain over the navicular prominence, or base of fifth metatarsal. +++  tenderness to palpation of the calcaneal insertion of plantar fascia as well as medial aspect posterior arch No pain at the Achilles insertion. No tenderness to palpation over the tarsals, metatarsals, or phalanges. No tenderness palpation over interphalangeal joints. Neurovascularly intact distally.

## 2016-11-24 NOTE — Assessment & Plan Note (Signed)
Please give patient handout from sports medicine advisor on plantar fasciitis.  Please use good supportive shoes at all times that have good arch support.  If you go to the [employer] store as we discussed they will have inserts in her shoes that have good support for people with plantar fasciitis.  I recommend using a frozen water bottle and ruling out along the bottom of her foot for 15-20 minutes 4-5 times per day.  Also use a towel and stretch your foot as displayed in the handouts I provided.  You can use Advil or Aleve when necessary for pain.

## 2016-11-24 NOTE — Assessment & Plan Note (Signed)
Wt loss would help relieve sx.  Counseling done

## 2016-11-26 ENCOUNTER — Other Ambulatory Visit: Payer: Self-pay | Admitting: Family Medicine

## 2016-12-01 ENCOUNTER — Encounter: Payer: 59 | Admitting: Family Medicine

## 2016-12-01 ENCOUNTER — Encounter: Payer: Self-pay | Admitting: Family Medicine

## 2016-12-01 VITALS — BP 126/86 | HR 97 | Ht 62.0 in | Wt 192.0 lb

## 2016-12-01 DIAGNOSIS — E669 Obesity, unspecified: Secondary | ICD-10-CM

## 2016-12-01 DIAGNOSIS — E66811 Obesity, class 1: Secondary | ICD-10-CM

## 2016-12-01 DIAGNOSIS — I10 Essential (primary) hypertension: Secondary | ICD-10-CM

## 2016-12-01 DIAGNOSIS — R7303 Prediabetes: Secondary | ICD-10-CM

## 2016-12-01 DIAGNOSIS — G8929 Other chronic pain: Secondary | ICD-10-CM

## 2016-12-01 DIAGNOSIS — E7800 Pure hypercholesterolemia, unspecified: Secondary | ICD-10-CM

## 2016-12-01 DIAGNOSIS — M722 Plantar fascial fibromatosis: Secondary | ICD-10-CM

## 2016-12-01 DIAGNOSIS — M25571 Pain in right ankle and joints of right foot: Secondary | ICD-10-CM

## 2016-12-01 DIAGNOSIS — E559 Vitamin D deficiency, unspecified: Secondary | ICD-10-CM

## 2016-12-01 NOTE — Progress Notes (Signed)
Impression and Recommendations:    No diagnosis found.  No problem-specific Assessment & Plan notes found for this encounter.   The patient was counseled, risk factors were discussed, anticipatory guidance given.   Gross side effects, risk and benefits, and alternatives of medications and treatment plan in general discussed with patient.  Patient is aware that all medications have potential side effects and we are unable to predict every side effect or drug-drug interaction that may occur.   Patient will call with any questions prior to using medication if they have concerns.  Expresses verbal understanding and consents to current therapy and treatment regimen.  No barriers to understanding were identified.  Red flag symptoms and signs discussed in detail.  Patient expressed understanding regarding what to do in case of emergency\urgent symptoms  Please see AVS handed out to patient at the end of our visit for further patient instructions/ counseling done pertaining to today's office visit.   No Follow-up on file.     Note: This document was prepared using Dragon voice recognition software and may include unintentional dictation errors.  Thomasene Lot  --------------------------------------------------------------------------------------------------------------------------------------------------------------------------------------------------------------------------------------------    Subjective:    CC:  Chief Complaint  Patient presents with  . Follow-up    HPI: Paula Lambert is a 46 y.o. female who presents to [facility] today for issues as discussed below.    She is here for chronic disease management and follow-up.  Works [employer] [place] from home.      Bp - at home doing really well. 120's / 70's- really good.  Since walking a lot - 35-75 min/day.      obesity:   Patient is frustrated she has been walking but admits to a poor diet.    Her cousin is Will Veter[employer] surgeon- fittest man in [place] and has his own wt loss/ work out facility- she thinking about calling him up but is not mentally ready to commit to healthy lifestyle yet.  Gained 11 pounds or so since Dec 2017.    Elevated LDL-  Admits to eating spicey [employer] fried chicken sandwich daily.  Gained wt    Pre-DM:  46mo ago  Hemoglobin A1C 6.3     Specimen Collected: 06/10/16 09:35     BS--->  Brought in monitor with her. FBS's- 100-130's;  2 hr PP- 160-190's   ; stopped cream in coffee      plantar fasciitis/ ankle pain:   Right foot and ankle is still bothering her.  She s gotten new shs and new adalsand wearing better support for her arches. She occasionally stretches and ices foot;  feeling much better but ankle still sore.   "Noticed R foot pain while walking dog 3-4 days ago, no injury.  Hurts to walk/ wtr bear on it first thing in the am.   Eases up as she moves around in the daytime.   Feels like it is swollen a little.   Has been walking more than usual trying to lose wt.  Wearing poorly supportive shoes.  Most of the time wears sneakers. Tried- nothing excpt advil and rubbing it."          Wt Readings from Last 3 Encounters:  12/01/16 192 lb (87.1 kg)  11/24/16 190 lb (86.2 kg)  10/20/16 182 lb (82.6 kg)   BP Readings from Last 3 Encounters:  12/01/16 126/86  11/24/16 124/85  10/20/16 114/80   BMI Readings from Last 3 Encounters:  12/01/16 35.12  kg/m  11/24/16 34.75 kg/m  10/20/16 33.29 kg/m     Patient Care Team    Relationship Specialty Notifications Start End  Thomasene Lot, DO PCP - General Family Medicine  06/05/16      Patient Active Problem List   Diagnosis Date Noted  . Pre-diabetes/ glucose intolerance  (new onset)  06/20/2016    Priority: High  . Elevated LDL cholesterol level 06/20/2016    Priority: High  . Morbid obesity due to excess calories ([name]) 06/20/2016    Priority: High  . Essential hypertension 06/05/2016     Priority: High  . Vaccination not carried out because of patient refusal 06/20/2016    Priority: Medium  . Cough variant asthma 06/05/2016    Priority: Medium  . Sleep apnea 06/05/2016    Priority: Medium  . Heart murmur 06/05/2016    Priority: Medium  . GERD (gastroesophageal reflux disease) 06/05/2016    Priority: Medium  . Vitamin D deficiency 06/19/2016    Priority: Low  . Environmental and seasonal allergies 06/05/2016    Priority: Low  . Obesity, Class I, BMI 30-34.9 11/24/2016  . Plantar fasciitis of right foot 11/24/2016  . Screening for breast cancer 09/24/2016  . Hypokalemia 09/15/2016  . Sinus tachycardia 09/15/2016  . Asthma exacerbation 09/14/2016  . Encounter for annual routine gynecological examination 07/09/2016    Past Medical history, Surgical history, Family history, Social history, Allergies and Medications have been entered into the medical record, reviewed and changed as needed.    Current Meds  Medication Sig  . albuterol (PROVENTIL HFA;VENTOLIN HFA) 108 (90 Base) MCG/ACT inhaler Inhale 2 puffs into the lungs every 6 (six) hours as needed for wheezing or shortness of breath.  . Cholecalciferol (VITAMIN D-3) 5000 units TABS Take 1 tablet by mouth daily.  Marland Kitchen FLOVENT HFA 110 MCG/ACT inhaler Inhale 2 puffs into the lungs 2 (two) times daily.  Marland Kitchen loratadine (CLARITIN) 10 MG tablet Take 1 tablet (10 mg total) by mouth daily.  Marland Kitchen losartan-hydrochlorothiazide (HYZAAR) 50-12.5 MG tablet Take 1 tablet by mouth daily.  . montelukast (SINGULAIR) 10 MG tablet TAKE 1 TABLET AT BEDTIME  . Vitamin D, Ergocalciferol, (DRISDOL) 50000 units CAPS capsule Take 50,000 Units by mouth once a week.    Allergies:  Allergies  Allergen Reactions  . Ultram [Tramadol Hcl] Other (See Comments)    syncope  . Amoxicillin Hives and Swelling  . Penicillins Hives and Swelling  . Sulfa Antibiotics Hives  . Sesame Oil Hives  . Other Other (See Comments)    PEANUTS AND ALMONDS      Review of Systems: General:   Denies fever, chills, unexplained weight loss.  Optho/Auditory:   Denies visual changes, blurred vision/LOV Respiratory:   Denies wheeze, DOE more than baseline levels.  Cardiovascular:   Denies chest pain, palpitations, new onset peripheral edema  Gastrointestinal:   Denies nausea, vomiting, diarrhea, abd pain.  Genitourinary: Denies dysuria, freq/ urgency, flank pain or discharge from genitals.  Endocrine:     Denies hot or cold intolerance, polyuria, polydipsia. Musculoskeletal:   Denies unexplained myalgias, joint swelling, unexplained arthralgias, gait problems.  Skin:  Denies new onset rash, suspicious lesions Neurological:     Denies dizziness, unexplained weakness, numbness  Psychiatric/Behavioral:   Denies mood changes, suicidal or homicidal ideations, hallucinations    Objective:   Blood pressure 126/86, pulse 97, height 5\' 2"  (1.575 m), weight 192 lb (87.1 kg). Body mass index is 35.12 kg/m. General:  Well Developed, well nourished, appropriate for  stated age.  Neuro:  Alert and oriented,  extra-ocular muscles intact  HEENT:  Normocephalic, atraumatic, neck supple Skin:  no gross rash, warm, pink. Cardiac:  RRR, S1 S2 Respiratory:  ECTA B/L and A/P, Not using accessory muscles, speaking in full sentences- unlabored. Vascular:  Ext warm, no cyanosis apprec.; cap RF less 2 sec. Psych:  No HI/SI, judgement and insight good, Euthymic mood. Full Affect. R Foot: No visible erythema or swelling. Range of motion is full in all directions. Strength is 5/5 in all directions. No hallux valgus. No pes cavus or pes planus. + mild calcaneal callus noted. No pain over the navicular prominence, or base of fifth metatarsal. +++  tenderness to palpation of the calcaneal insertion of plantar fascia as well as medial aspect posterior arch No pain at the Achilles insertion. No tenderness to palpation over the tarsals, metatarsals, or phalanges. No  tenderness palpation over interphalangeal joints. Neurovascularly intact distally.

## 2016-12-08 ENCOUNTER — Other Ambulatory Visit: Payer: Self-pay | Admitting: Family Medicine

## 2016-12-09 ENCOUNTER — Other Ambulatory Visit: Payer: Self-pay

## 2016-12-09 MED ORDER — VITAMIN D (ERGOCALCIFEROL) 1.25 MG (50000 UNIT) PO CAPS: 50000.0000 [IU] | ORAL_CAPSULE | ORAL | 0 refills | Status: DC

## 2016-12-09 NOTE — Telephone Encounter (Signed)
Pharmacy sent over refill request for Vitamin D 50,000 units. Our office has not written for this medication please advise.  MPulliam, CMA

## 2016-12-09 NOTE — Telephone Encounter (Signed)
Vitamin D refilled to [facility].

## 2016-12-09 NOTE — Telephone Encounter (Signed)
Vitamin D is always okay to refill.  Thanks!

## 2016-12-17 ENCOUNTER — Other Ambulatory Visit (INDEPENDENT_AMBULATORY_CARE_PROVIDER_SITE_OTHER): Payer: 59

## 2016-12-17 DIAGNOSIS — E7800 Pure hypercholesterolemia, unspecified: Secondary | ICD-10-CM

## 2016-12-17 DIAGNOSIS — E559 Vitamin D deficiency, unspecified: Secondary | ICD-10-CM

## 2016-12-17 DIAGNOSIS — R7303 Prediabetes: Secondary | ICD-10-CM

## 2016-12-17 DIAGNOSIS — I10 Essential (primary) hypertension: Secondary | ICD-10-CM

## 2016-12-17 DIAGNOSIS — E669 Obesity, unspecified: Secondary | ICD-10-CM

## 2016-12-17 DIAGNOSIS — E66811 Obesity, class 1: Secondary | ICD-10-CM

## 2016-12-18 ENCOUNTER — Encounter: Payer: Self-pay | Admitting: Podiatry

## 2016-12-18 ENCOUNTER — Telehealth: Payer: Self-pay | Admitting: Podiatry

## 2016-12-18 ENCOUNTER — Ambulatory Visit (INDEPENDENT_AMBULATORY_CARE_PROVIDER_SITE_OTHER): Payer: 59

## 2016-12-18 ENCOUNTER — Ambulatory Visit (INDEPENDENT_AMBULATORY_CARE_PROVIDER_SITE_OTHER): Payer: 59 | Admitting: Podiatry

## 2016-12-18 ENCOUNTER — Ambulatory Visit: Payer: 59

## 2016-12-18 VITALS — BP 119/74 | HR 99 | Resp 16 | Ht 61.5 in | Wt 185.0 lb

## 2016-12-18 DIAGNOSIS — M779 Enthesopathy, unspecified: Secondary | ICD-10-CM

## 2016-12-18 DIAGNOSIS — M79671 Pain in right foot: Secondary | ICD-10-CM

## 2016-12-18 LAB — T4, FREE: FREE T4: 1.16 ng/dL (ref 0.82–1.77)

## 2016-12-18 LAB — HEMOGLOBIN A1C
Est. average glucose Bld gHb Est-mCnc: 126 mg/dL
Hgb A1c MFr Bld: 6 % — ABNORMAL HIGH (ref 4.8–5.6)

## 2016-12-18 LAB — LIPID PANEL
CHOL/HDL RATIO: 3.3 ratio (ref 0.0–4.4)
CHOLESTEROL TOTAL: 200 mg/dL — AB (ref 100–199)
HDL: 61 mg/dL (ref >39)
LDL CALC: 122 mg/dL — AB (ref 0–99)
Triglycerides: 85 mg/dL (ref 0–149)
VLDL CHOLESTEROL CAL: 17 mg/dL (ref 5–40)

## 2016-12-18 LAB — TSH: TSH: 2.76 u[IU]/mL (ref 0.450–4.500)

## 2016-12-18 LAB — VITAMIN D 25 HYDROXY (VIT D DEFICIENCY, FRACTURES): VIT D 25 HYDROXY: 47.4 ng/mL (ref 30.0–100.0)

## 2016-12-18 MED ORDER — DICLOFENAC SODIUM 75 MG PO TBEC: 75.0000 mg | DELAYED_RELEASE_TABLET | Freq: Two times a day (BID) | ORAL | 2 refills | Status: DC

## 2016-12-18 MED ORDER — MELOXICAM 15 MG PO TABS: 15.0000 mg | ORAL_TABLET | Freq: Every day | ORAL | 2 refills | Status: DC

## 2016-12-18 MED ORDER — TRIAMCINOLONE ACETONIDE 10 MG/ML IJ SUSP
10.0000 mg | Freq: Once | INTRAMUSCULAR | Status: AC
  Administered 2016-12-18: 10 mg

## 2016-12-18 NOTE — Telephone Encounter (Signed)
Dr. Ardelle AntonWagoner ordered Mobic 15 mg #30 one tablet daily +2 refills. Left message informing pt.

## 2016-12-18 NOTE — Patient Instructions (Signed)

## 2016-12-18 NOTE — Progress Notes (Signed)
Subjective:    Patient ID: Paula Lambert, female   DOB: 46 y.o.   MRN: [id number]   HPI patient presents stating I'm having pain on the inside of both ankles with my right foot being worse than the left and states that this is been going on for about a month and does not remember specific injury    Review of Systems  All other systems reviewed and are negative.       Objective:  Physical Exam  Cardiovascular: Intact distal pulses.   Musculoskeletal: Normal range of motion.  Neurological: She is alert.  Skin: Skin is warm.  Nursing note and vitals reviewed.  neurovascular status intact muscle strength adequate range of motion within normal limits with quite a bit of discomfort in the posterior tibial tendon right as it comes underneath the medial malleolus with no indications of muscle strength loss. Moderate depression of the arch is noted bilateral with inflammatory changes to have good digital perfusion well oriented 3     Assessment:   Posterior tibial tendon inflammation right over left with fluid buildup with no indication of muscle dysfunction      Plan:   H&P condition reviewed. Today careful sheath injection administered 3 mg Kenalog 5 mill grams Xylocaine instructed on fascial brace usage that we'll be bilateral and the injection was administered right. Placed on oral anti-inflammatory and gave instructions on physical therapy and reappoint in 2 weeks and discussed probable long-term orthotic treatment  X-rays indicate moderate depression of the arch bilateral with no indications of bone pathology

## 2016-12-18 NOTE — Progress Notes (Signed)
   Subjective:    Patient ID: Paula Lambert, female    DOB: [birth year], 46 y.o.   MRN: [id number]  HPI Chief Complaint  Patient presents with  . Foot Pain    Bilateral; back of heel; pt stated, "Hurts in the morning; entire feet hurt": x2 weeks      Review of Systems  Constitutional: Positive for unexpected weight change.  Musculoskeletal: Positive for arthralgias and gait problem.  Allergic/Immunologic: Positive for food allergies.  All other systems reviewed and are negative.      Objective:   Physical Exam        Assessment & Plan:

## 2016-12-18 NOTE — Telephone Encounter (Signed)
Yes, I was just there and saw Dr. Charlsie Merlesegal. He prescribed diclofenac (Voltaren) 75 MG EC tablet. I'm allergic to sulfa and my pharmacist told me this is in the sulfa family and to call to get a different Rx.

## 2017-01-01 ENCOUNTER — Ambulatory Visit: Payer: 59 | Admitting: Podiatry

## 2017-01-14 ENCOUNTER — Encounter: Payer: Self-pay | Admitting: Podiatry

## 2017-01-14 ENCOUNTER — Ambulatory Visit (INDEPENDENT_AMBULATORY_CARE_PROVIDER_SITE_OTHER): Payer: 59 | Admitting: Podiatry

## 2017-01-14 DIAGNOSIS — B351 Tinea unguium: Secondary | ICD-10-CM

## 2017-01-14 DIAGNOSIS — M779 Enthesopathy, unspecified: Secondary | ICD-10-CM | POA: Diagnosis not present

## 2017-01-15 NOTE — Progress Notes (Signed)
Subjective:    Patient ID: Paula Lambert, female   DOB: 46 y.o.   MRN: [id number]   HPI patient presents stating that she continues to have pain in the right arch and that she needs some kind of support to try to elevated and also was concerned about discoloration of nails    ROS      Objective:  Physical Exam neurovascular status intact with patient noted to have moderate depression of the arch bilateral with discoloration of the hallux nails medial side bilateral and several other nails     Assessment:   Chronic tendinitis with mycotic nail infection      Plan:    H&P condition reviewed and advised this patient on orthotics with orthotic casting done at this time. I then discussed the nail condition and we will watch it and hopefully it'll grow out over time

## 2017-01-20 ENCOUNTER — Encounter: Payer: Self-pay | Admitting: Internal Medicine

## 2017-01-20 ENCOUNTER — Ambulatory Visit (INDEPENDENT_AMBULATORY_CARE_PROVIDER_SITE_OTHER): Payer: 59 | Admitting: Internal Medicine

## 2017-01-20 VITALS — BP 112/84 | HR 80 | Ht 61.5 in | Wt 188.0 lb

## 2017-01-20 DIAGNOSIS — J45991 Cough variant asthma: Secondary | ICD-10-CM

## 2017-01-20 NOTE — Patient Instructions (Addendum)
Plan A = Automatic = singulair 10 mg daily / Flovent hfa 110 Take 2 puffs first thing in am and then another 2 puffs about 12 hours later.    Work on maintaining perfect  inhaler technique:  relax and gently blow all the way out then take a nice smooth deep breath back in, triggering the inhaler at same time you start breathing in.  Hold for up to 5 seconds if you can. Blow out thru nose. Rinse and gargle with water when done      Plan B = Backup Only use your albuterol (Proair) as a rescue medication to be used if you can't catch your breath by resting or doing a relaxed purse lip breathing pattern.  - The less you use it, the better it will work when you need it. - Ok to use the inhaler up to 2 puffs  every 4 hours if you must but call for appointment if use goes up over your usual need - Don't leave home without it !!  (think of it like the spare tire for your car)    Please schedule a follow up visit in 6  months but call sooner if needed

## 2017-01-20 NOTE — Progress Notes (Signed)
Subjective:     Patient ID: Paula Lambert, female   DOB: 01-24-1971,     MRN: [id number]    Brief patient profile:  [age] yobf never smoker with ? EIA while playing BB never 100% better and mostly on saba but has tried advair / symbicort /Breo of the three BREO worked the best but could not afford and now on flovent hfa since last admit which occurred p ran out of ICS x 6 months   [facility]   Admit date: 09/14/2016 Discharge date: 09/17/2016   . ACUTE Asthma exacerbation . Hypokalemia . Essential hypertension . Sinus tachycardia      Allergies:        Allergies  Allergen Reactions  . Ultram [Tramadol Hcl] Other (See Comments)    syncope  . Amoxicillin Hives and Swelling  . Penicillins Hives and Swelling  . Sulfa Antibiotics Hives  . Other Other (See Comments)    PEANUTS AND ALMONDS     DISCHARGE MEDICATIONS:    Current Discharge Medication List       START taking these medications   Details  azithromycin (ZITHROMAX) 500 MG tablet Take 1 tablet (500 mg total) by mouth daily. Qty: 5 tablet, Refills: 0    beclomethasone (QVAR) 80 MCG/ACT inhaler Inhale 2 puffs into the lungs 2 (two) times daily. Qty: 1 Inhaler, Refills: 12    benzonatate (TESSALON) 100 MG capsule Take 1 capsule (100 mg total) by mouth 3 (three) times daily as needed for cough. For cough Qty: 30 capsule, Refills: 0    guaiFENesin-codeine 100-10 MG/5ML syrup Take 5 mLs by mouth every 6 (six) hours as needed for cough. Qty: 120 mL, Refills: 0    loratadine (CLARITIN) 10 MG tablet Take 1 tablet (10 mg total) by mouth daily. Qty: 30 tablet, Refills: 0    montelukast (Lambert) 10 MG tablet Take 1 tablet (10 mg total) by mouth at bedtime. Qty: 30 tablet, Refills: 3    omeprazole (PRILOSEC) 20 MG capsule Take 1 capsule (20 mg total) by mouth daily. Any generic PPI is okay. Please take prilosec while you are on prednisone Qty: 30  capsule, Refills: 0    predniSONE (DELTASONE) 10 MG tablet Prednisone dosing: Take  Prednisone 40mg  (4 tabs) x 3 days, then taper to 30mg  (3 tabs) x 3 days, then 20mg  (2 tabs) x 3days, then 10mg  (1 tab) x 3days, then OFF. Qty: 30 tablet, Refills: 0         CONTINUE these medications which have NOT CHANGED   Details  albuterol (PROVENTIL HFA;VENTOLIN HFA) 108 (90 Base) MCG/ACT inhaler Inhale 2 puffs into the lungs every 6 (six) hours as needed for wheezing or shortness of breath.    Cholecalciferol (VITAMIN D-3) 5000 units TABS Take 1 tablet by mouth daily.    losartan-hydrochlorothiazide (HYZAAR) 50-12.5 MG tablet Take 1 tablet by mouth daily. Qty: 90 tablet, Refills: 3    Vitamin D, Ergocalciferol, (DRISDOL) 50000 units CAPS capsule Take 50,000 Units by mouth once a week.         Brief H and P: For complete details please refer to admission H and P, but in brief Paula AlphaLakesha Gloveris a 46 y.o.femalewith medical history significant of HTN and asthma; whopresents with complaints of shortness of breath for the last 3-4 days. Symptoms initially started with sore throat and sinus congestion. Now reporting productive cough with clear sputum and wheezing. Coughing spells seem to make symptoms worse.  Reports utilizing her home inhaler multiple times daily as well as TheraFlu without relief of symptoms. Patient reports difficult control asthma over the last couple months which she's had repeated urgent care visits   Hospital Course:   Acute Asthma exacerbation: With a history of moderate persistent asthma - Per patient she has been on different maintenance inhaled corticosteroids in the past however she moved from [place] last year, did establish PCP however her PCP has been out of office due to surgery. In the past, Qvar has worked good. She has been asthmatic since she was a child. She has been on Lambert as well in the past. She only had albuterol inhaler at home and has  been using very frequently more than 4 days a week, which qualifies for moderate to severe persistent asthma. No history of intubation in the past. - Patient was placed on scheduled nebs with Xopenex,Solu-Medrol, Zithromax - She was also placed on Claritin, upon discharge, she was placed on maintenance inhaled corticosteroids on Qvar  - PlaceD on Tessalon, antitussives  - Outpatient follow-up with pulmonology, Dr. Sherene SiresWert scheduled on May 1st  Hypokalemia: Acute. - Replaced  Essential hypertension - Continue Hyzaar  Sinus tachycardia: Suspect secondary to recent breathing treatments given Improved.     10/20/2016 1st [facility] office visit/ Paula Lambert  Chief Complaint  Patient presents with  . Pulmonary Consult    Referred by Dr. Denton Aripu Lambert. Pt states she has had asthma as a child. She has been having worsening symptoms of cough and wheezing since Fall 2017. She states today she is feeling well and has no respiratory symptoms.   after last running out of ICS due to  cost, moved to [place] and made it about 6 months and rare for need for albterol but then felt caught cold late March 2018 albuterol up to 4 puffs a day for sev weeks before landing in [place] but now back to baseline and no need for saba at all on flovent 110/ Lambert  rec Plan A = Automatic = Lambert 10 mg daily / Flovent hfa 110 Take 2 puffs first thing in am and then another 2 puffs about 12 hours later.  Work on inhaler technique:  Plan B = Backup Only use your albuterol (Proair) as rescue    01/20/2017  f/u ov/Paula Lambert re: mild chronic asthma/ flovent 110 2bid / Lambert maint and no saba need  Chief Complaint  Patient presents with  . Follow-up    Cough has resolved. Breathing is doing well and she has not needed her albuterol.     sleeping fine/ no need for albuterol and as active as she can be given foot pain issues  No obvious day to day or daytime variability or assoc excess/  purulent sputum or mucus plugs or hemoptysis or cp or chest tightness, subjective wheeze or overt sinus or hb symptoms. No unusual exp hx or h/o childhood pna/ asthma or knowledge of premature birth.  Sleeping ok without nocturnal  or early am exacerbation  of respiratory  c/o's or need for noct saba. Also denies any obvious fluctuation of symptoms with weather or environmental changes or other aggravating or alleviating factors except as outlined above   Current Medications, Allergies, Complete Past Medical History, Past Surgical History, Family History, and Social History were reviewed in [facility] electronic medical record.  ROS  The following are not active complaints unless bolded sore throat, dysphagia, dental problems, itching, sneezing,  nasal  congestion or excess/ purulent secretions, ear ache,   fever, chills, sweats, unintended wt loss, classically pleuritic or exertional cp,  orthopnea pnd or leg swelling, presyncope, palpitations, abdominal pain, anorexia, nausea, vomiting, diarrhea  or change in bowel or bladder habits, change in stools or urine, dysuria,hematuria,  rash, arthralgias, visual complaints, headache, numbness, weakness or ataxia or problems with walking or coordination,  change in mood/affect or memory.                       Objective:   Physical Exam   Pleasant amb bf nad/ all smiles    01/20/2017        188  10/20/16 182 lb (82.6 kg)  09/24/16 191 lb 8 oz (86.9 kg)  09/16/16 184 lb 8 oz (83.7 kg)    Vital signs reviewed - Note on arrival 02 sats  96% on RA     HEENT: nl dentition,   and oropharynx. Nl external ear canals without cough reflex - mild  bilateral non-specific turbinate edema     NECK :  without JVD/Nodes/TM/ nl carotid upstrokes bilaterally   LUNGS: no acc muscle use,  Nl contour chest which is clear to A and P bilaterally without cough on insp or exp maneuvers   CV:  RRR  no s3 or murmur or increase in P2, and no edema   ABD:   soft and nontender with nl inspiratory excursion in the supine position. No bruits or organomegaly appreciated, bowel sounds nl  MS:  Nl gait/ ext warm without deformities, calf tenderness, cyanosis or clubbing No obvious joint restrictions   SKIN: warm and dry without lesions    NEURO:  alert, approp, nl sensorium with  no motor or cerebellar deficits apparent.        Assessment:

## 2017-01-21 NOTE — Assessment & Plan Note (Signed)
FENO 10/20/2016  =   16 on flovent 110 and singulair   - 01/20/2017  After extensive coaching HFA effectiveness =   90%   All goals of chronic asthma control met including optimal function and elimination of symptoms with minimal need for rescue therapy.  Contingencies discussed in full including contacting this office immediately if not controlling the symptoms using the rule of two's.

## 2017-01-21 NOTE — Assessment & Plan Note (Signed)
Body mass index is 34.95 kg/m.  -  trending up  Lab Results  Component Value Date   TSH 2.760 12/17/2016     Contributing to gerd risk/ doe/reviewed the need and the process to achieve and maintain neg calorie balance > defer f/u primary care including intermittently monitoring thyroid status

## 2017-02-04 ENCOUNTER — Other Ambulatory Visit: Payer: 59 | Admitting: Orthotics

## 2017-04-19 ENCOUNTER — Ambulatory Visit: Payer: 59 | Admitting: Family Medicine

## 2017-04-20 ENCOUNTER — Ambulatory Visit (INDEPENDENT_AMBULATORY_CARE_PROVIDER_SITE_OTHER): Payer: 59 | Admitting: Family Medicine

## 2017-04-20 ENCOUNTER — Encounter: Payer: Self-pay | Admitting: Family Medicine

## 2017-04-20 VITALS — BP 128/88 | HR 96 | Ht 61.5 in | Wt 192.4 lb

## 2017-04-20 DIAGNOSIS — M25552 Pain in left hip: Secondary | ICD-10-CM

## 2017-04-20 DIAGNOSIS — M62838 Other muscle spasm: Secondary | ICD-10-CM

## 2017-04-20 DIAGNOSIS — M25551 Pain in right hip: Secondary | ICD-10-CM

## 2017-04-20 DIAGNOSIS — M7061 Trochanteric bursitis, right hip: Secondary | ICD-10-CM

## 2017-04-20 DIAGNOSIS — M545 Low back pain: Secondary | ICD-10-CM

## 2017-04-20 DIAGNOSIS — W19XXXA Unspecified fall, initial encounter: Secondary | ICD-10-CM

## 2017-04-20 MED ORDER — CYCLOBENZAPRINE HCL 10 MG PO TABS: 10.0000 mg | ORAL_TABLET | Freq: Three times a day (TID) | ORAL | 0 refills | Status: DC | PRN

## 2017-04-20 MED ORDER — IBUPROFEN 600 MG PO TABS: 600.0000 mg | ORAL_TABLET | Freq: Three times a day (TID) | ORAL | 0 refills | Status: DC | PRN

## 2017-04-20 NOTE — Progress Notes (Signed)
Pt here for an acute care OV today   Impression and Recommendations:    1. Hip pain, bilateral   2. Bilateral low back pain, unspecified chronicity, with sciatica presence unspecified   3. Greater trochanteric bursitis of right hip   4. Fall, initial encounter   5. Muscle spasm    Please ice 15 minutes 4-5 times per day to lower back as well as your right hip in that bursa region.  Please go to physical therapy as they will show you exercises that will help loosen your muscles and level out your hips to help control your pain.  The patient was counseled, risk factors were discussed, anticipatory guidance given.  New Prescriptions   CYCLOBENZAPRINE (FLEXERIL) 10 MG TABLET    Take 1 tablet (10 mg total) by mouth 3 (three) times daily as needed for muscle spasms.   DESOXIMETASONE (TOPICORT) 0.25 % CREAM    Apply 1 application topically 2 (two) times daily.   IBUPROFEN (ADVIL,MOTRIN) 600 MG TABLET    Take 1 tablet (600 mg total) by mouth every 8 (eight) hours as needed.    Discontinued Medications   CHOLECALCIFEROL (VITAMIN D-3) 5000 UNITS TABS    Take 1 tablet by mouth daily.   MELOXICAM (MOBIC) 15 MG TABLET    Take 1 tablet (15 mg total) by mouth daily.    Modified Medications   Modified Medication Previous Medication   GLUCOSE BLOOD (ONE TOUCH ULTRA TEST) TEST STRIP glucose blood (ONE TOUCH ULTRA TEST) test strip      USE ONE STRIP TO CHECK BLOOD SUGARS EVERY MORNING FASTING AND 2 HOURS AFTER LARGEST MEAL.    Use one strip to check blood sugars every morning fasting and 2 hours after largest meal.   LOSARTAN-HYDROCHLOROTHIAZIDE (HYZAAR) 50-12.5 MG TABLET losartan-hydrochlorothiazide (HYZAAR) 50-12.5 MG tablet      Take 1 tablet by mouth daily.    Take 1 tablet by mouth daily.   MONTELUKAST (SINGULAIR) 10 MG TABLET montelukast (SINGULAIR) 10 MG tablet      TAKE 1 TABLET BY MOUTH EVERYDAY AT BEDTIME    TAKE 1 TABLET AT BEDTIME   VITAMIN D, ERGOCALCIFEROL, (DRISDOL) 50000 UNITS  CAPS CAPSULE Vitamin D, Ergocalciferol, (DRISDOL) 50000 units CAPS capsule      TAKE ONE CAPSULE BY MOUTH ONE TIME PER WEEK    Take 1 capsule (50,000 Units total) by mouth once a week.     Meds ordered this encounter  Medications  . ibuprofen (ADVIL,MOTRIN) 600 MG tablet    Sig: Take 1 tablet (600 mg total) by mouth every 8 (eight) hours as needed.    Dispense:  30 tablet    Refill:  0  . cyclobenzaprine (FLEXERIL) 10 MG tablet    Sig: Take 1 tablet (10 mg total) by mouth 3 (three) times daily as needed for muscle spasms.    Dispense:  30 tablet    Refill:  0    Orders Placed This Encounter  Procedures  . Ambulatory referral to Physical Therapy     Gross side effects, risk and benefits, and alternatives of medications and treatment plan in general discussed with patient.  Patient is aware that all medications have potential side effects and we are unable to predict every side effect or drug-drug interaction that may occur.   Patient will call with any questions prior to using medication if they have concerns.  Expresses verbal understanding and consents to current therapy and treatment regimen.  No barriers to understanding were  identified.  Red flag symptoms and signs discussed in detail.  Patient expressed understanding regarding what to do in case of emergency\urgent symptoms  Please see AVS handed out to patient at the end of our visit for further patient instructions/ counseling done pertaining to today's office visit.   Return if symptoms worsen or fail to improve.     Note: This document was prepared using Dragon voice recognition software and may include unintentional dictation errors.   Subjective:    CC:  Chief Complaint  Patient presents with  . Hip Pain    bilat hip pain and tenderness; right side worse   . Pain    tenderness under lt arm x 1 month    HPI: Paula Lambert is a 46 y.o. female who [facility] for  issues as discussed below.  Fell off stool into box with clothes in it 3 wks ago.  Then she noticed pain L hip days later, then yesterday AM- she woke up and had 10/10 pain in R hip- never felt pain like this before.     No problems updated.   Wt Readings from Last 3 Encounters:  07/14/17 190 lb 6.4 oz (86.4 kg)  04/20/17 192 lb 6.4 oz (87.3 kg)  01/20/17 188 lb (85.3 kg)   BP Readings from Last 3 Encounters:  07/14/17 136/87  04/20/17 128/88  01/20/17 112/84   BMI Readings from Last 3 Encounters:  07/14/17 35.98 kg/m  04/20/17 35.76 kg/m  01/20/17 34.95 kg/m     Patient Care Team    Relationship Specialty Notifications Start End  Thomasene Lotpalski, Haydin Calandra, DO PCP - General Family Medicine  06/05/16      Patient Active Problem List   Diagnosis Date Noted  . Eczema 07/14/2017  . Obesity, Class I, BMI 30-34.9 11/24/2016  . Plantar fasciitis of right foot 11/24/2016  . Screening for breast cancer 09/24/2016  . Hypokalemia 09/15/2016  . Sinus tachycardia 09/15/2016  . Asthma exacerbation 09/14/2016  . Encounter for annual routine gynecological examination 07/09/2016  . Pre-diabetes/ glucose intolerance  (new onset)  06/20/2016  . Elevated LDL cholesterol level 06/20/2016  . Vaccination not carried out because of patient refusal 06/20/2016  . Morbid obesity due to excess calories ([name]) 06/20/2016  . Vitamin D deficiency 06/19/2016  . Essential hypertension 06/05/2016  . Cough variant asthma 06/05/2016  . Sleep apnea 06/05/2016  . Heart murmur 06/05/2016  . GERD (gastroesophageal reflux disease) 06/05/2016  . Environmental and seasonal allergies 06/05/2016    Past Medical history, Surgical history, Family history, Social history, Allergies and Medications have been entered into the medical record, reviewed and changed as needed.    Current Meds  Medication Sig  . FLOVENT HFA 110 MCG/ACT inhaler Inhale 2 puffs into the lungs 2 (two) times daily.  Marland Kitchen. loratadine (CLARITIN)  10 MG tablet Take 1 tablet (10 mg total) by mouth daily.  Marland Kitchen. PROAIR HFA 108 (90 Base) MCG/ACT inhaler 1 PUFF INHALE EVERY 4-6 HOURS AS NEEDED (PRN)  . [DISCONTINUED] Cholecalciferol (VITAMIN D-3) 5000 units TABS Take 1 tablet by mouth daily.  . [DISCONTINUED] losartan-hydrochlorothiazide (HYZAAR) 50-12.5 MG tablet Take 1 tablet by mouth daily.  . [DISCONTINUED] meloxicam (MOBIC) 15 MG tablet Take 1 tablet (15 mg total) by mouth daily.  . [DISCONTINUED] montelukast (SINGULAIR) 10 MG tablet TAKE 1 TABLET AT BEDTIME  . [DISCONTINUED] Vitamin D, Ergocalciferol, (DRISDOL) 50000 units CAPS capsule Take 1 capsule (50,000 Units total) by mouth once a  week.    Allergies:  Allergies  Allergen Reactions  . Ultram [Tramadol Hcl] Other (See Comments)    syncope  . Amoxicillin Hives and Swelling  . Penicillins Hives and Swelling  . Sulfa Antibiotics Hives  . Peanut Oil Other (See Comments)    Unknown, from allergy testing  . Sesame Oil Hives  . Other Other (See Comments)    PEANUTS AND ALMONDS     Review of Systems: General:   Denies fever, chills, unexplained weight loss.  Optho/Auditory:   Denies visual changes, blurred vision/LOV Respiratory:   Denies wheeze, DOE more than baseline levels.  Cardiovascular:   Denies chest pain, palpitations, new onset peripheral edema  Gastrointestinal:   Denies nausea, vomiting, diarrhea, abd pain.  Genitourinary: Denies dysuria, freq/ urgency, flank pain or discharge from genitals.  Endocrine:     Denies hot or cold intolerance, polyuria, polydipsia. Musculoskeletal:   Denies unexplained myalgias, joint swelling, unexplained arthralgias, gait problems.  Skin:  Denies new onset rash, suspicious lesions Neurological:     Denies dizziness, unexplained weakness, numbness  Psychiatric/Behavioral:   Denies mood changes, suicidal or homicidal ideations, hallucinations    Objective:   Blood pressure 128/88, pulse 96, height 5' 1.5" (1.562 m), weight 192 lb 6.4  oz (87.3 kg), last menstrual period 03/30/2017. Body mass index is 35.76 kg/m. General:  Well Developed, well nourished, appropriate for stated age.  Neuro:  Alert and oriented,  extra-ocular muscles intact  HEENT:  Normocephalic, atraumatic, neck supple Skin:  no gross rash, warm, pink. Cardiac:  RRR, S1 S2 Respiratory:  ECTA B/L and A/P, Not using accessory muscles, speaking in full sentences- unlabored. Hip: ROM-diminished due to pain in all fields. -Right hip exquisitely tender over greater trochanteric bursa -Pain with rotation of ASIS bilaterally but especially on the left. No SI joint tenderness and normal minimal SI movement. Vascular:  Ext warm, no cyanosis apprec.; cap RF less 2 sec. Psych:  No HI/SI, judgement and insight good, Euthymic mood. Full Affect.

## 2017-04-20 NOTE — Patient Instructions (Addendum)
Please ice 15 minutes 4-5 times per day to lower back as well as your right hip in that bursa region.  Please go to physical therapy as they will show you exercises that will help loosen your muscles and level out your hips to help control your pain.  -  Muscle Cramps and Spasms Muscle cramps and spasms occur when a muscle or muscles tighten and you have no control over this tightening (involuntary muscle contraction). They are a common problem and can develop in any muscle. The most common place is in the calf muscles of the leg. Muscle cramps and muscle spasms are both involuntary muscle contractions, but there are some differences between the two:  Muscle cramps are painful. They come and go and may last a few seconds to 15 minutes. Muscle cramps are often more forceful and last longer than muscle spasms.  Muscle spasms may or may not be painful. They may also last just a few seconds or much longer.  Certain medical conditions, such as diabetes or Parkinson disease, can make it more likely to develop cramps or spasms. However, cramps or spasms are usually not caused by a serious underlying problem. Common causes include:  Overexertion.  Overuse from repetitive motions, or doing the same thing over and over.  Remaining in a certain position for a long period of time.  Improper preparation, form, or technique while playing a sport or doing an activity.  Dehydration.  Injury.  Side effects of some medicines.  Abnormally low levels of the salts and ions in your blood (electrolytes), especially potassium and calcium. This could happen if you are taking water pills (diuretics) or if you are pregnant.  In many cases, the cause of muscle cramps or spasms is unknown. Follow these instructions at home:  Stay well hydrated. Drink enough fluid to keep your urine clear or pale yellow.  Try massaging, stretching, and relaxing the affected muscle.  If directed, apply heat to tight or tense  muscles as often as told by your health care provider. Use the heat source that your health care provider recommends, such as a moist heat pack or a heating pad. ? Place a towel between your skin and the heat source. ? Leave the heat on for 20-30 minutes. ? Remove the heat if your skin turns bright red. This is especially important if you are unable to feel pain, heat, or cold. You may have a greater risk of getting burned.  If directed, put ice on the affected area. This may help if you are sore or have pain after a cramp or spasm. ? Put ice in a plastic bag. ? Place a towel between your skin and the bag. ? Leavethe ice on for 20 minutes, 2-3 times a day.  Take over-the-counter and prescription medicines only as told by your health care provider.  Pay attention to any changes in your symptoms. Contact a health care provider if:  Your cramps or spasms get more severe or happen more often.  Your cramps or spasms do not improve over time. This information is not intended to replace advice given to you by your health care provider. Make sure you discuss any questions you have with your health care provider. Document Released: 11/28/2001 Document Revised: 07/10/2015 Document Reviewed: 03/12/2015 [employer]vier Interactive Patient Education  2018 [employer]

## 2017-05-03 ENCOUNTER — Ambulatory Visit: Payer: 59 | Attending: Family Medicine | Admitting: Physical Therapy

## 2017-05-03 DIAGNOSIS — M545 Low back pain, unspecified: Secondary | ICD-10-CM

## 2017-05-03 DIAGNOSIS — M25551 Pain in right hip: Secondary | ICD-10-CM | POA: Diagnosis not present

## 2017-05-03 DIAGNOSIS — M25552 Pain in left hip: Secondary | ICD-10-CM | POA: Diagnosis not present

## 2017-05-03 NOTE — Therapy (Signed)
[place], [place], [address] Phone: [phone]   Fax:  [phone]  Physical Therapy Evaluation  Patient Details  Name: Paula Lambert MRN: [id number] Date of Birth: 12/30/1970 Referring Provider: Mellody Dance, DO   Encounter Date: 05/03/2017  PT End of Session - 05/03/17 1543    Visit Number  1    Number of Visits  9    Date for PT Re-Evaluation  06/04/17    Authorization Type  UHC    PT Start Time  1500    PT Stop Time  3382    PT Time Calculation (min)  44 min    Activity Tolerance  Patient tolerated treatment well    Behavior During Therapy  [facility] for tasks assessed/performed       Past Medical History:  Diagnosis Date  . Asthma   . GERD (gastroesophageal reflux disease)   . Heart murmur    leaking valve  . Hypertension   . Sleep apnea     Past Surgical History:  Procedure Laterality Date  . BREAST SURGERY     reduction  . LIPOMA EXCISION    . TENDON RELEASE Right    elbow  . TONSILLECTOMY      There were no vitals filed for this visit.   Subjective Assessment - 05/03/17 1506    Subjective  Fell from stool around Oct 10 and fell into a box. Had L hip pain at first that moved to R. Now has pain wrapping around waist about level of ASIS. Tender to the touch bilaterally at Gr Troch, uncomfortable in groin bilat.     Currently in Pain?  Yes    Pain Score  5     Pain Location  Hip    Pain Orientation  Right;Left    Pain Descriptors / Indicators  Tender    Aggravating Factors   standing    Pain Relieving Factors  moving around         [facility] PT Assessment - 05/03/17 0001      Assessment   Medical Diagnosis  bilateral hip pain    Referring Provider  Mellody Dance, DO    Onset Date/Surgical Date  03/31/17    Hand Dominance  Right    Prior Therapy  not this year      Precautions   Precautions  None      Restrictions   Weight Bearing Restrictions  No      Balance  Screen   Has the patient fallen in the past 6 months  Yes    How many times?  1    Has the patient had a decrease in activity level because of a fear of falling?   No    Is the patient reluctant to leave their home because of a fear of falling?   No      Home Film/video editor residence      Prior Function   Vocation  Full time employment    Vocation Requirements  seated on computer      Cognition   Overall Cognitive Status  Within Functional Limits for tasks assessed      Sensation   Additional Comments  R foot N/T right after injury      Palpation   Palpation comment  TTP bilat gr troch, bilat SIJ      Ambulation/Gait   Gait Comments  vaulting over L leg  Objective measurements completed on examination: See above findings.      [place] Adult PT Treatment/Exercise - 05/03/17 0001      Exercises   Exercises  Knee/Hip      Knee/Hip Exercises: Stretches   Passive Hamstring Stretch Limitations  supine with strap & seated    Other Knee/Hip Stretches  LTR      Knee/Hip Exercises: Supine   Other Supine Knee/Hip Exercises  pelvic tilt- supine & seated             PT Education - 05/03/17 1654    Education provided  Yes    Education Details  anatomy of condition, POC, HEP, exercise form/rationale, seated & sleeping posture    Person(s) Educated  Patient    Methods  Explanation;Demonstration;Tactile cues;Verbal cues;Handout    Comprehension  Verbalized understanding;Need further instruction;Returned demonstration;Verbal cues required;Tactile cues required          PT Long Term Goals - 05/03/17 1643      PT LONG TERM GOAL #1   Title  Pt will be able to return to walking 3-4 miles/day for exercise    Baseline  limited by pain at eval    Time  4    Period  Weeks    Status  New    Target Date  06/04/17      PT LONG TERM GOAL #2   Title  Pt will demo gross 5/5 MMT at hips in order to demo proper biomechanical chain  stability    Baseline  NT at eval due to notable pelvic malalignment    Time  4    Period  Weeks    Status  New    Target Date  06/04/17      PT LONG TERM GOAL #3   Title  Pt will verbalize ability to utilize proper posture and frequent mobility throughout her day at work to reduce pain from prolonged seated positioning    Baseline  began educating at eval    Time  4    Period  Weeks    Status  New    Target Date  06/04/17      PT LONG TERM GOAL #4   Title  Reduction in tenderness to palpation to bilat gr trochanters    Baseline  pain at eval    Time  4    Period  Weeks    Status  New    Target Date  06/04/17             Plan - 05/03/17 1638    Clinical Impression Statement  Pt presents to PT with complaints of low back and bilateral hip pain after falling from a stool one month ago where she fell back into a box. apparent upslip on Rt side that I was unable to correct today due to increased back pain with traction. bilat LE meas equal but demo L increased leg length, attempted MET for Lt post innom rotation which seemed to decrease LLD but not completely. Pt began having increased midline LBP so exercises for abdominal engagement and pelvic stabilization were added. Pt will benefit from skilled PT in order to improve lumbopelvic stability and alignment to decrease hip and LBP.     History and Personal Factors relevant to plan of care:  chronic LBP, HTN, asthma, GERD    Clinical Presentation  Evolving    Clinical Presentation due to:  pain moving to bilat hips     Clinical Decision Making  Moderate  Rehab Potential  Good    PT Frequency  2x / week    PT Duration  4 weeks    PT Treatment/Interventions  ADLs/Self Care Home Management;Cryotherapy;Electrical Stimulation;Ultrasound;Traction;Moist Heat;Iontophoresis 62m/ml Dexamethasone;Gait training;Stair training;Functional mobility training;Therapeutic activities;Therapeutic exercise;Balance training;Patient/family  education;Neuromuscular re-education;Manual techniques;Passive range of motion;Taping;Dry needling    PT Next Visit Plan  recheck pelvic alignment, hip abductor strengthening    PT Home Exercise Plan  pelvic tilt-supine & seated, LTR, hamstring stretch    Consulted and Agree with Plan of Care  Patient       Patient will benefit from skilled therapeutic intervention in order to improve the following deficits and impairments:  Abnormal gait, Pain, Postural dysfunction, Increased muscle spasms, Decreased activity tolerance, Decreased strength, Impaired flexibility, Difficulty walking  Visit Diagnosis: Acute midline low back pain without sciatica - Plan: PT plan of care cert/re-cert  Pain in right hip - Plan: PT plan of care cert/re-cert  Pain in left hip - Plan: PT plan of care cert/re-cert     Problem List Patient Active Problem List   Diagnosis Date Noted  . Obesity, Class I, BMI 30-34.9 11/24/2016  . Plantar fasciitis of right foot 11/24/2016  . Screening for breast cancer 09/24/2016  . Hypokalemia 09/15/2016  . Sinus tachycardia 09/15/2016  . Asthma exacerbation 09/14/2016  . Encounter for annual routine gynecological examination 07/09/2016  . Pre-diabetes/ glucose intolerance  (new onset)  06/20/2016  . Elevated LDL cholesterol level 06/20/2016  . Vaccination not carried out because of patient refusal 06/20/2016  . Morbid obesity due to excess calories (HEl [place] 06/20/2016  . Vitamin D deficiency 06/19/2016  . Essential hypertension 06/05/2016  . Cough variant asthma 06/05/2016  . Sleep apnea 06/05/2016  . Heart murmur 06/05/2016  . GERD (gastroesophageal reflux disease) 06/05/2016  . Environmental and seasonal allergies 06/05/2016   Paula Lambert PT, DPT 05/03/17 4:56 PM   [facility] [address]Phone: [phone]  Fax:  [phone] Name: Paula TobinMRN: [id number] of  Birth: [birth year]

## 2017-05-03 NOTE — Patient Instructions (Signed)
   Stand every 45 min Sit with towel bw knees & pelvic tilt Sleep with pillow bw knees

## 2017-05-06 ENCOUNTER — Encounter: Payer: 59 | Admitting: Physical Therapy

## 2017-05-10 ENCOUNTER — Ambulatory Visit: Payer: 59 | Admitting: Physical Therapy

## 2017-05-10 ENCOUNTER — Encounter: Payer: Self-pay | Admitting: Physical Therapy

## 2017-05-10 DIAGNOSIS — M545 Low back pain, unspecified: Secondary | ICD-10-CM

## 2017-05-10 DIAGNOSIS — M25552 Pain in left hip: Secondary | ICD-10-CM

## 2017-05-10 DIAGNOSIS — M25551 Pain in right hip: Secondary | ICD-10-CM

## 2017-05-10 NOTE — Therapy (Signed)
[facility] [address] [place], [place], [address] Phone: [phone]   Fax:  [phone]  Physical Therapy Treatment  Patient Details  Name: Paula KillingsLakesha Lambert MRN: [id number] Date of Birth: 02-27-1971 Referring Provider: Thomasene Lotpalski, Deborah, DO   Encounter Date: 05/10/2017  PT End of Session - 05/10/17 0849    Visit Number  2    Number of Visits  9    Date for PT Re-Evaluation  06/04/17    Authorization Type  UHC    PT Start Time  [phone]    PT Stop Time  0930    PT Time Calculation (min)  41 min    Activity Tolerance  Patient tolerated treatment well    Behavior During Therapy  [facility] for tasks assessed/performed       Past Medical History:  Diagnosis Date  . Asthma   . GERD (gastroesophageal reflux disease)   . Heart murmur    leaking valve  . Hypertension   . Sleep apnea     Past Surgical History:  Procedure Laterality Date  . BREAST SURGERY     reduction  . LIPOMA EXCISION    . TENDON RELEASE Right    elbow  . TONSILLECTOMY      There were no vitals filed for this visit.  Subjective Assessment - 05/10/17 0849    Subjective  Was at a wedding yesterday, is sleepy today. Had to wear heels for a couple of hours yesterday and was very sore. got about 3 hours of sleep. Feels tender when touching muscles around hips and when talking.     Currently in Pain?  Yes    Pain Score  7     Pain Location  Hip    Pain Orientation  Right;Left    Pain Descriptors / Indicators  Tender                      [facility] Adult PT Treatment/Exercise - 05/10/17 0001      Knee/Hip Exercises: Stretches   Hip Flexor Stretch  Both;30 seconds    ITB Stretch  Both;30 seconds sidelying over pillow    Other Knee/Hip Stretches  figure 4 stretch    Other Knee/Hip Stretches  cat/camel/child pose      Knee/Hip Exercises: Aerobic   Stationary Bike  5 min L2      Manual Therapy   Manual Therapy  Soft tissue mobilization    Soft  tissue mobilization  roller bilat hips, ITB, VL, HS; trigger point release bilat TFL                  PT Long Term Goals - 05/03/17 1643      PT LONG TERM GOAL #1   Title  Pt will be able to return to walking 3-4 miles/day for exercise    Baseline  limited by pain at eval    Time  4    Period  Weeks    Status  New    Target Date  06/04/17      PT LONG TERM GOAL #2   Title  Pt will demo gross 5/5 MMT at hips in order to demo proper biomechanical chain stability    Baseline  NT at eval due to notable pelvic malalignment    Time  4    Period  Weeks    Status  New    Target Date  06/04/17      PT LONG TERM GOAL #3  Title  Pt will verbalize ability to utilize proper posture and frequent mobility throughout her day at work to reduce pain from prolonged seated positioning    Baseline  began educating at eval    Time  4    Period  Weeks    Status  New    Target Date  06/04/17      PT LONG TERM GOAL #4   Title  Reduction in tenderness to palpation to bilat gr trochanters    Baseline  pain at eval    Time  4    Period  Weeks    Status  New    Target Date  06/04/17            Plan - 05/10/17 0930    Clinical Impression Statement  Pt with excessive tightness into bilateral lower extremities and around hips. Trigger point in L TFL notable. Pt reported feeling a little looser after treatment today. Provided handout of stretches and asked her to do them every day.     PT Treatment/Interventions  ADLs/Self Care Home Management;Cryotherapy;Electrical Stimulation;Ultrasound;Traction;Moist Heat;Iontophoresis 4mg /ml Dexamethasone;Gait training;Stair training;Functional mobility training;Therapeutic activities;Therapeutic exercise;Balance training;Patient/family education;Neuromuscular re-education;Manual techniques;Passive range of motion;Taping;Dry needling    PT Next Visit Plan  how did stretches go?, DN if pt agrees, gross hip mobility exercises    PT Home Exercise Plan   pelvic tilt-supine & seated, LTR, stretches: hamstring, thomas, figure 4, gastroc, ITB, cat/camel/child pose;     Consulted and Agree with Plan of Care  Patient       Patient will benefit from skilled therapeutic intervention in order to improve the following deficits and impairments:  Abnormal gait, Pain, Postural dysfunction, Increased muscle spasms, Decreased activity tolerance, Decreased strength, Impaired flexibility, Difficulty walking  Visit Diagnosis: Acute midline low back pain without sciatica  Pain in right hip  Pain in left hip     Problem List Patient Active Problem List   Diagnosis Date Noted  . Obesity, Class I, BMI 30-34.9 11/24/2016  . Plantar fasciitis of right foot 11/24/2016  . Screening for breast cancer 09/24/2016  . Hypokalemia 09/15/2016  . Sinus tachycardia 09/15/2016  . Asthma exacerbation 09/14/2016  . Encounter for annual routine gynecological examination 07/09/2016  . Pre-diabetes/ glucose intolerance  (new onset)  06/20/2016  . Elevated LDL cholesterol level 06/20/2016  . Vaccination not carried out because of patient refusal 06/20/2016  . Morbid obesity due to excess calories ([name]) 06/20/2016  . Vitamin D deficiency 06/19/2016  . Essential hypertension 06/05/2016  . Cough variant asthma 06/05/2016  . Sleep apnea 06/05/2016  . Heart murmur 06/05/2016  . GERD (gastroesophageal reflux disease) 06/05/2016  . Environmental and seasonal allergies 06/05/2016    Tayden Nichelson C. Quince Santana PT, DPT 05/10/17 9:33 AM   [facility] [address] [place], [place], [address] Phone: [phone]   Fax:  [phone]  Name: Paula KillingsLakesha Lambert MRN: [id number] Date of Birth: 02-13-1971

## 2017-05-12 ENCOUNTER — Encounter: Payer: Self-pay | Admitting: Physical Therapy

## 2017-05-12 ENCOUNTER — Ambulatory Visit: Payer: 59 | Admitting: Physical Therapy

## 2017-05-12 DIAGNOSIS — M545 Low back pain, unspecified: Secondary | ICD-10-CM

## 2017-05-12 DIAGNOSIS — M25551 Pain in right hip: Secondary | ICD-10-CM

## 2017-05-12 DIAGNOSIS — M25552 Pain in left hip: Secondary | ICD-10-CM

## 2017-05-12 NOTE — Therapy (Signed)
[facility] [address] [place], [place], [address] Phone: [phone]   Fax:  [phone]  Physical Therapy Treatment  Patient Details  Name: Paula KillingsLakesha Lambert MRN: [id number] Date of Birth: [birth year] Referring Provider: Thomasene Lotpalski, Deborah, DO   Encounter Date: 05/12/2017  PT End of Session - 05/12/17 1152    Visit Number  3    Number of Visits  9    Date for PT Re-Evaluation  06/04/17    Authorization Type  UHC    PT Start Time  1148    PT Stop Time  1230    PT Time Calculation (min)  42 min    Activity Tolerance  Patient tolerated treatment well    Behavior During Therapy  [facility] for tasks assessed/performed       Past Medical History:  Diagnosis Date  . Asthma   . GERD (gastroesophageal reflux disease)   . Heart murmur    leaking valve  . Hypertension   . Sleep apnea     Past Surgical History:  Procedure Laterality Date  . BREAST SURGERY     reduction  . LIPOMA EXCISION    . TENDON RELEASE Right    elbow  . TONSILLECTOMY      There were no vitals filed for this visit.  Subjective Assessment - 05/12/17 1152    Subjective  felt a little better after last time but stil hurts, pointing to L TFL. L is worse than R.     Currently in Pain?  Yes    Pain Score  8     Pain Location  Hip    Pain Orientation  Right;Left                      [facility] Adult PT Treatment/Exercise - 05/12/17 0001      Exercises   Exercises  Other Exercises    Other Exercises   reformer see note      Knee/Hip Exercises: Stretches   ITB Stretch  Other (comment) sidelying TFL/ITB stretch      Knee/Hip Exercises: Aerobic   Stationary Bike  5 min L2      Knee/Hip Exercises: Machines for Strengthening   Cybex Leg Press  single plate [id number]      Manual Therapy   Manual therapy comments  skilled palpation and monitoring during TPDN    Soft tissue mobilization  Lt TFL       Trigger Point Dry Needling - 05/12/17 1209    Consent Given?  Yes    Education Handout Provided  -- verbal education    Muscles Treated Lower Body  Tensor fascia lata        Pilates Reformer used for LE/core strength, postural strength, lumbopelvic disassociation and core control.  Exercises included: Footwork neutral, ER on heels, on toes, marching Bridging [id number] ball bw knees      PT Education - 05/12/17 1300    Education provided  Yes    Education Details  exercise form/rationale, TPDN & expected outcomes    Person(s) Educated  Patient    Methods  Explanation;Demonstration;Tactile cues;Verbal cues    Comprehension  Verbalized understanding;Need further instruction;Returned demonstration;Verbal cues required;Tactile cues required          PT Long Term Goals - 05/03/17 1643      PT LONG TERM GOAL #1   Title  Pt will be able to return to walking 3-4 miles/day for exercise    Baseline  limited by  pain at eval    Time  4    Period  Weeks    Status  New    Target Date  06/04/17      PT LONG TERM GOAL #2   Title  Pt will demo gross 5/5 MMT at hips in order to demo proper biomechanical chain stability    Baseline  NT at eval due to notable pelvic malalignment    Time  4    Period  Weeks    Status  New    Target Date  06/04/17      PT LONG TERM GOAL #3   Title  Pt will verbalize ability to utilize proper posture and frequent mobility throughout her day at work to reduce pain from prolonged seated positioning    Baseline  began educating at eval    Time  4    Period  Weeks    Status  New    Target Date  06/04/17      PT LONG TERM GOAL #4   Title  Reduction in tenderness to palpation to bilat gr trochanters    Baseline  pain at eval    Time  4    Period  Weeks    Status  New    Target Date  06/04/17            Plan - 05/12/17 1300    Clinical Impression Statement  Pt reported decreased concordant pain following TPDN today. Was able to do exercises on reformer without hip pain but with appropriate  difficulty. Discussed importance of frequent movement and stretching throughout the day.     PT Treatment/Interventions  ADLs/Self Care Home Management;Cryotherapy;Electrical Stimulation;Ultrasound;Traction;Moist Heat;Iontophoresis 4mg /ml Dexamethasone;Gait training;Stair training;Functional mobility training;Therapeutic activities;Therapeutic exercise;Balance training;Patient/family education;Neuromuscular re-education;Manual techniques;Passive range of motion;Taping;Dry needling    PT Next Visit Plan  DN outcomes? reformer    PT Home Exercise Plan  pelvic tilt-supine & seated, LTR, stretches: hamstring, thomas, figure 4, gastroc, ITB, cat/camel/child pose;        Patient will benefit from skilled therapeutic intervention in order to improve the following deficits and impairments:  Abnormal gait, Pain, Postural dysfunction, Increased muscle spasms, Decreased activity tolerance, Decreased strength, Impaired flexibility, Difficulty walking  Visit Diagnosis: Acute midline low back pain without sciatica  Pain in right hip  Pain in left hip     Problem List Patient Active Problem List   Diagnosis Date Noted  . Obesity, Class I, BMI 30-34.9 11/24/2016  . Plantar fasciitis of right foot 11/24/2016  . Screening for breast cancer 09/24/2016  . Hypokalemia 09/15/2016  . Sinus tachycardia 09/15/2016  . Asthma exacerbation 09/14/2016  . Encounter for annual routine gynecological examination 07/09/2016  . Pre-diabetes/ glucose intolerance  (new onset)  06/20/2016  . Elevated LDL cholesterol level 06/20/2016  . Vaccination not carried out because of patient refusal 06/20/2016  . Morbid obesity due to excess calories ([name]) 06/20/2016  . Vitamin D deficiency 06/19/2016  . Essential hypertension 06/05/2016  . Cough variant asthma 06/05/2016  . Sleep apnea 06/05/2016  . Heart murmur 06/05/2016  . GERD (gastroesophageal reflux disease) 06/05/2016  . Environmental and seasonal allergies  06/05/2016   Salem Lembke C. Marsean Elkhatib PT, DPT 05/12/17 1:02 PM   [facility] [address] [place], [place], [address] Phone: [phone]   Fax:  [phone]  Name: Paula KillingsLakesha Lambert MRN: [id number] Date of Birth: 08-03-1970

## 2017-05-17 ENCOUNTER — Ambulatory Visit: Payer: 59 | Admitting: Physical Therapy

## 2017-05-17 ENCOUNTER — Encounter: Payer: Self-pay | Admitting: Physical Therapy

## 2017-05-17 DIAGNOSIS — M25552 Pain in left hip: Secondary | ICD-10-CM

## 2017-05-17 DIAGNOSIS — M545 Low back pain, unspecified: Secondary | ICD-10-CM

## 2017-05-17 DIAGNOSIS — M25551 Pain in right hip: Secondary | ICD-10-CM

## 2017-05-17 NOTE — Therapy (Signed)
[facility] [address] [place], [place], [address] Phone: [phone]   Fax:  [phone]  Physical Therapy Treatment  Patient Details  Name: Paula Lambert MRN: [id number] Date of Birth: June 03, 1971 Referring Provider: Thomasene Lambert, Deborah, DO   Encounter Date: 05/17/2017  PT End of Session - 05/17/17 1503    Visit Number  4    Number of Visits  9    Date for PT Re-Evaluation  06/04/17    Authorization Type  [employer]    PT Start Time  1503    PT Stop Time  1543    PT Time Calculation (min)  40 min    Activity Tolerance  Patient tolerated treatment well    Behavior During Therapy  [facility] for tasks assessed/performed       Past Medical History:  Diagnosis Date  . Asthma   . GERD (gastroesophageal reflux disease)   . Heart murmur    leaking valve  . Hypertension   . Sleep apnea     Past Surgical History:  Procedure Laterality Date  . BREAST SURGERY     reduction  . LIPOMA EXCISION    . TENDON RELEASE Right    elbow  . TONSILLECTOMY      There were no vitals filed for this visit.  Subjective Assessment - 05/17/17 1504    Subjective  Dry needling was helpful. Still feel it when I touch it but do not feel the pinching when I walk.     Currently in Pain?  Yes    Pain Score  2     Pain Location  Hip    Pain Orientation  Right;Left    Pain Descriptors / Indicators  Tender                      [facility] Adult PT Treatment/Exercise - 05/17/17 0001      Knee/Hip Exercises: Stretches   Passive Hamstring Stretch Limitations  supine with strap, center & adducted    Hip Flexor Stretch  Both;30 seconds    Other Knee/Hip Stretches  figure 4 stretch    Other Knee/Hip Stretches  sidelying TFL stretch      Knee/Hip Exercises: Aerobic   Stationary Bike  5 min L5      Knee/Hip Exercises: Machines for Strengthening   Cybex Leg Press  single plate X32x20      Knee/Hip Exercises: Standing   Heel Raises  20 reps;Both  with core + glut activation    Other Standing Knee Exercises  glut+core activation      Knee/Hip Exercises: Seated   Sit to Sand  10 reps;without UE support cues for gluts      Knee/Hip Exercises: Supine   Bridges with Harley-DavidsonBall Squeeze  20 reps      Knee/Hip Exercises: Sidelying   Clams  x20 each                  PT Long Term Goals - 05/03/17 1643      PT LONG TERM GOAL #1   Title  Pt will be able to return to walking 3-4 miles/day for exercise    Baseline  limited by pain at eval    Time  4    Period  Weeks    Status  New    Target Date  06/04/17      PT LONG TERM GOAL #2   Title  Pt will demo gross 5/5 MMT at hips in order  to demo proper biomechanical chain stability    Baseline  NT at eval due to notable pelvic malalignment    Time  4    Period  Weeks    Status  New    Target Date  06/04/17      PT LONG TERM GOAL #3   Title  Pt will verbalize ability to utilize proper posture and frequent mobility throughout her day at work to reduce pain from prolonged seated positioning    Baseline  began educating at eval    Time  4    Period  Weeks    Status  New    Target Date  06/04/17      PT LONG TERM GOAL #4   Title  Reduction in tenderness to palpation to bilat gr trochanters    Baseline  pain at eval    Time  4    Period  Weeks    Status  New    Target Date  06/04/17            Plan - 05/17/17 1543    Clinical Impression Statement  Pt was able to tolerate exercises today focused on activation of glut max in coordination with abdominal wall. No increase in TFL pain. Encouraged pt to find a gym that she would like to join.     PT Treatment/Interventions  ADLs/Self Care Home Management;Cryotherapy;Electrical Stimulation;Ultrasound;Traction;Moist Heat;Iontophoresis 4mg /ml Dexamethasone;Gait training;Stair training;Functional mobility training;Therapeutic activities;Therapeutic exercise;Balance training;Patient/family education;Neuromuscular re-education;Manual  techniques;Passive range of motion;Taping;Dry needling    PT Next Visit Plan  reformer    PT Home Exercise Plan  pelvic tilt-supine & seated, LTR, stretches: hamstring, thomas, figure 4, gastroc, ITB, cat/camel/child pose;     Consulted and Agree with Plan of Care  Patient       Patient will benefit from skilled therapeutic intervention in order to improve the following deficits and impairments:  Abnormal gait, Pain, Postural dysfunction, Increased muscle spasms, Decreased activity tolerance, Decreased strength, Impaired flexibility, Difficulty walking  Visit Diagnosis: Acute midline low back pain without sciatica  Pain in right hip  Pain in left hip     Problem List Patient Active Problem List   Diagnosis Date Noted  . Obesity, Class I, BMI 30-34.9 11/24/2016  . Plantar fasciitis of right foot 11/24/2016  . Screening for breast cancer 09/24/2016  . Hypokalemia 09/15/2016  . Sinus tachycardia 09/15/2016  . Asthma exacerbation 09/14/2016  . Encounter for annual routine gynecological examination 07/09/2016  . Pre-diabetes/ glucose intolerance  (new onset)  06/20/2016  . Elevated LDL cholesterol level 06/20/2016  . Vaccination not carried out because of patient refusal 06/20/2016  . Morbid obesity due to excess calories ([name]) 06/20/2016  . Vitamin D deficiency 06/19/2016  . Essential hypertension 06/05/2016  . Cough variant asthma 06/05/2016  . Sleep apnea 06/05/2016  . Heart murmur 06/05/2016  . GERD (gastroesophageal reflux disease) 06/05/2016  . Environmental and seasonal allergies 06/05/2016    Paula Lambert PT, DPT 05/17/17 3:47 PM   [facility] [address] [place], [place], [address] Phone: [phone]   Fax:  [phone]  Name: Paula Lambert MRN: [id number] Date of Birth: 1970-08-18

## 2017-05-19 ENCOUNTER — Ambulatory Visit: Payer: 59 | Admitting: Physical Therapy

## 2017-05-24 ENCOUNTER — Encounter: Payer: Self-pay | Admitting: Physical Therapy

## 2017-05-24 ENCOUNTER — Ambulatory Visit: Payer: 59 | Attending: Family Medicine | Admitting: Physical Therapy

## 2017-05-24 DIAGNOSIS — M25551 Pain in right hip: Secondary | ICD-10-CM | POA: Diagnosis not present

## 2017-05-24 DIAGNOSIS — M25552 Pain in left hip: Secondary | ICD-10-CM | POA: Diagnosis not present

## 2017-05-24 DIAGNOSIS — M545 Low back pain, unspecified: Secondary | ICD-10-CM

## 2017-05-24 NOTE — Therapy (Addendum)
[place], [place], [address] Phone: [phone]   Fax:  [phone]  Physical Therapy Treatment/Discharge Summary  Patient Details  Name: Paula Lambert MRN: [id number] Date of Birth: [birth year] Referring Provider: Mellody Dance, DO   Encounter Date: 05/24/2017  PT End of Session - 05/24/17 1502    Visit Number  5    Number of Visits  9    Date for PT Re-Evaluation  06/04/17    Authorization Type  [employer]    PT Start Time  1502    PT Stop Time  1535    PT Time Calculation (min)  33 min    Activity Tolerance  Patient tolerated treatment well    Behavior During Therapy  [facility] for tasks assessed/performed       Past Medical History:  Diagnosis Date  . Asthma   . GERD (gastroesophageal reflux disease)   . Heart murmur    leaking valve  . Hypertension   . Sleep apnea     Past Surgical History:  Procedure Laterality Date  . BREAST SURGERY     reduction  . LIPOMA EXCISION    . TENDON RELEASE Right    elbow  . TONSILLECTOMY      There were no vitals filed for this visit.  Subjective Assessment - 05/24/17 1503    Subjective  Reports hips feel good, still feels tender along lateral thighs.     Currently in Pain?  Yes    Pain Score  5     Pain Location  -- bilat ITB    Pain Orientation  Right;Left    Pain Descriptors / Indicators  Tender         [facility] PT Assessment - 05/24/17 0001      ROM / Strength   AROM / PROM / Strength  Strength      Strength   Strength Assessment Site  Hip    Right/Left Hip  Right;Left    Right Hip Flexion  5/5    Right Hip Extension  5/5    Right Hip ABduction  5/5    Left Hip Flexion  5/5    Left Hip Extension  5/5    Left Hip ABduction  5/5                  [facility] Adult PT Treatment/Exercise - 05/24/17 0001      Knee/Hip Exercises: Stretches   Piriformis Stretch  Both;30 seconds    Other Knee/Hip Stretches  child pose with R/L bias       Knee/Hip Exercises: Aerobic   Stationary Bike  5 min L5      Knee/Hip Exercises: Supine   Bridges with Ball Squeeze  20 reps cuing for maintaining core contraction    Other Supine Knee/Hip Exercises  pelvic tilt             PT Education - 05/24/17 1659    Education provided  Yes    Education Details  exercise form/rationale, FOTO, finding an outlet for stress, importance of continued HEP    Person(s) Educated  Patient    Methods  Explanation;Demonstration;Tactile cues;Verbal cues    Comprehension  Verbalized understanding;Returned demonstration;Verbal cues required;Tactile cues required          PT Long Term Goals - 05/24/17 1529      PT LONG TERM GOAL #1   Title  Pt will be able to return to walking 3-4 miles/day for exercise  Baseline  walking    Status  Achieved      PT LONG TERM GOAL #2   Title  Pt will demo gross 5/5 MMT at hips in order to demo proper biomechanical chain stability    Baseline  5/5 at d/c    Status  Achieved      PT LONG TERM GOAL #3   Title  Pt will verbalize ability to utilize proper posture and frequent mobility throughout her day at work to reduce pain from prolonged seated positioning    Baseline  is cognisant and corrects    Status  Achieved      PT LONG TERM GOAL #4   Title  Reduction in tenderness to palpation to bilat gr trochanters    Baseline  still has some tenderness but not the same pain    Status  Partially Met            Plan - 05/24/17 1700    Clinical Impression Statement  Pt has met all of her goals at this time and is prepared to continue with independent program. Pt was encouraged to contact us with any further questions.     PT Treatment/Interventions  ADLs/Self Care Home Management;Cryotherapy;Electrical Stimulation;Ultrasound;Traction;Moist Heat;Iontophoresis '4mg'$ /ml Dexamethasone;Gait training;Stair training;Functional mobility training;Therapeutic activities;Therapeutic exercise;Balance training;Patient/family  education;Neuromuscular re-education;Manual techniques;Passive range of motion;Taping;Dry needling    Consulted and Agree with Plan of Care  Patient       Patient will benefit from skilled therapeutic intervention in order to improve the following deficits and impairments:  Abnormal gait, Pain, Postural dysfunction, Increased muscle spasms, Decreased activity tolerance, Decreased strength, Impaired flexibility, Difficulty walking  Visit Diagnosis: Acute midline low back pain without sciatica  Pain in right hip  Pain in left hip     Problem List Patient Active Problem List   Diagnosis Date Noted  . Obesity, Class I, BMI 30-34.9 11/24/2016  . Plantar fasciitis of right foot 11/24/2016  . Screening for breast cancer 09/24/2016  . Hypokalemia 09/15/2016  . Sinus tachycardia 09/15/2016  . Asthma exacerbation 09/14/2016  . Encounter for annual routine gynecological examination 07/09/2016  . Pre-diabetes/ glucose intolerance  (new onset)  06/20/2016  . Elevated LDL cholesterol level 06/20/2016  . Vaccination not carried out because of patient refusal 06/20/2016  . Morbid obesity due to excess calories ([place]) 06/20/2016  . Vitamin D deficiency 06/19/2016  . Essential hypertension 06/05/2016  . Cough variant asthma 06/05/2016  . Sleep apnea 06/05/2016  . Heart murmur 06/05/2016  . GERD (gastroesophageal reflux disease) 06/05/2016  . Environmental and seasonal allergies 06/05/2016   PHYSICAL THERAPY DISCHARGE SUMMARY  Visits from Start of Care: 5  Current functional level related to goals / functional outcomes: See above   Remaining deficits: See above   Education / Equipment: Anatomy of condition, POC, HEP, exercise form/rationale  Plan: Patient agrees to discharge.  Patient goals were met. Patient is being discharged due to meeting the stated rehab goals.  ?????      Quintavia Rogstad C. Kennia Vanvorst PT, DPT 05/24/17 5:01 PM   [place]  [facility] [address] [place], [place], [address] Phone: [phone]   Fax:  [phone]  Name: Sakari Raisanen MRN: [id number] Date of Birth: [birth year]

## 2017-05-26 ENCOUNTER — Ambulatory Visit: Payer: 59 | Admitting: Physical Therapy

## 2017-06-05 ENCOUNTER — Other Ambulatory Visit: Payer: Self-pay | Admitting: Family Medicine

## 2017-07-12 ENCOUNTER — Other Ambulatory Visit: Payer: Self-pay

## 2017-07-12 MED ORDER — LOSARTAN POTASSIUM-HCTZ 50-12.5 MG PO TABS: 1.0000 | ORAL_TABLET | Freq: Every day | ORAL | 0 refills | Status: DC

## 2017-07-12 NOTE — Telephone Encounter (Signed)
Pharmacy sent refill request for Losartan- HCTZ. Reviewed chart and sent refill into pharmacy. MPulliam, CMA/RT(R)

## 2017-07-13 ENCOUNTER — Telehealth: Payer: Self-pay | Admitting: Family Medicine

## 2017-07-13 NOTE — Telephone Encounter (Signed)
Called patient to set up provider required OV-- Scheduled for 07/14/17 @ 1:15pm.  -glh

## 2017-07-14 ENCOUNTER — Encounter: Payer: Self-pay | Admitting: Family Medicine

## 2017-07-14 ENCOUNTER — Ambulatory Visit (INDEPENDENT_AMBULATORY_CARE_PROVIDER_SITE_OTHER): Payer: 59 | Admitting: Family Medicine

## 2017-07-14 VITALS — BP 136/87 | HR 98 | Ht 61.0 in | Wt 190.4 lb

## 2017-07-14 DIAGNOSIS — E559 Vitamin D deficiency, unspecified: Secondary | ICD-10-CM | POA: Diagnosis not present

## 2017-07-14 DIAGNOSIS — L309 Dermatitis, unspecified: Secondary | ICD-10-CM | POA: Diagnosis not present

## 2017-07-14 DIAGNOSIS — I10 Essential (primary) hypertension: Secondary | ICD-10-CM

## 2017-07-14 DIAGNOSIS — Z23 Encounter for immunization: Secondary | ICD-10-CM

## 2017-07-14 DIAGNOSIS — E7800 Pure hypercholesterolemia, unspecified: Secondary | ICD-10-CM

## 2017-07-14 DIAGNOSIS — R7303 Prediabetes: Secondary | ICD-10-CM | POA: Diagnosis not present

## 2017-07-14 LAB — POCT GLYCOSYLATED HEMOGLOBIN (HGB A1C): Hemoglobin A1C: 6

## 2017-07-14 MED ORDER — DESOXIMETASONE 0.25 % EX CREA: 1.0000 "application " | TOPICAL_CREAM | Freq: Two times a day (BID) | CUTANEOUS | 0 refills | Status: DC

## 2017-07-14 NOTE — Patient Instructions (Signed)

## 2017-07-14 NOTE — Progress Notes (Signed)
Impression and Recommendations:    1. Essential hypertension   2. Pre-diabetes/ glucose intolerance  (new onset)    3. Elevated LDL cholesterol level   4. Morbid obesity due to excess calories ([name])   5. Vitamin D deficiency   6. Flu vaccine need   7. Eczema, b/l upper arms     Essential hypertension - Pt is compliant with their medications and is tolerating them well.   Pt is stable and instructed to continue medications as prescribed for now.. Instructed her to continue taking her BP at home and keeping a log.  Goal BP: consistently below 130/80.    - If BP is not consistently below this, she is instructed to call the office to be  evaluated sooner.  Prediabetes/glucose intolerance - previous A1c check 6.0 from 6 months ago, today A1c is 6.0. Pt instructed to take blood sugars fasting or 2 hour post-prandial, as well as random times throughout the day if she feels bad or off. Continue monitoring what you eat and try to meet exercise guidelines.  We will continue to monitor her A1c every 4-6 months.  Patient will work on her diet and exercise.    Elevated LDL cholesterol level - Dietary and exercise guidelines discussed with patient. Recommended pt to reduce intake of saturated, trans fats and fatty carbohydrates. Handouts provided if desired.   obesity - Encouraged pt to continue losing weight, watching what she eats, and exercising daily. Recommended use of the Lose It or My Fitness Pal apps to track calories.  Patient is welcome to come back any time if she needs support and help with weight loss.  Vitamin D deficiency: Pt is compliant with her supplementals and stable at this time. Continue taking vitamin D supplementals as described below.  Flu vaccine - Pt will receive flu vaccination today.  7.     R foot / plantar fascia - ice roll on her foot 4-5 x a day,   Referred back to podiatrist about this issue as it could be as a result of her new orthotics that could be putting  stress on certain parts of her plantar fascia.   Explained to pt that it could be little microtears and edema that is causing it to be numb. Activity as tolerated as it does not make her pain worse. Further mgt per Podiatry  8. She is requesting a refill of her eczema medication.  Told patient about potential Vitiligo side effects due to extra strength medication.  Also discussed importance of not using medicine unless absolutely necessary.  Patient understands risk and still desires medication.   will refill prescription today.  -Follow up in 3-4 months for FBW, then schedule an OV one week later to discuss results.  I asked my CMA today to put in a full set of fasting lab orders for future draw.   Education and routine counseling performed. Handouts provided.   Orders Placed This Encounter  Procedures  . Flu Vaccine QUAD 6+ mos PF IM (Fluarix Quad PF)  . POCT HgB A1C    Meds ordered this encounter  Medications  . desoximetasone (TOPICORT) 0.25 % cream    Sig: Apply 1 application topically 2 (two) times daily.    Dispense:  30 g    Refill:  0    The patient was counseled, risk factors were discussed, anticipatory guidance given.  Gross side effects, risk and benefits, and alternatives of medications discussed with patient.  Patient is aware that  all medications have potential side effects and we are unable to predict every side effect or drug-drug interaction that may occur.  Expresses verbal understanding and consents to current therapy plan and treatment regimen.   Return for full set FBW 1 wk prior to next OV in 3-37mo. I asked Melissa my CMA to put these orders in today for the patient.   Please see AVS handed out to patient at the end of our visit for further patient instructions/ counseling done pertaining to today's office visit.    Note: This document was prepared using Dragon voice recognition software and may include unintentional dictation errors.  This document serves  as a record of services personally performed by Thomasene Lot, DO. It was created on her behalf by Thelma Barge, a trained medical scribe. The creation of this record is based on the scribe's personal observations and the provider's statements to them.   I have reviewed the above medical documentation for accuracy and completeness and I concur.  Thomasene Lot 07/14/17 5:44 PM     Subjective:    HPI: Paula Lambert is a 47 y.o. female who presents to [facility] today for follow up for HTN, pre-DM, and other issues as listed below.   HTN: -  Her blood pressure has been controlled at home. She states they have been up and down on occasion. She reports a high 152/110, which went down later when she rechecked. She states her BP has been around 130/80s or less mostly.   She states recently she has been working 6 days/week for the past few months and has been drinking a lot of coffee, which might be causing her higher than normal blood pressure.  She also had a few nights where she was not sleeping so well so that could have also contributed.   Her work schedule has now returned to normal.  - Patient reports good compliance with blood pressure medications  - Denies medication S-E   - Smoking Status noted   - She denies new onset of: chest pain, exercise intolerance, shortness of breath, dizziness, visual changes, headache, lower extremity swelling or claudication.    Pre- DM HPI:  A1c today, 6.0, from 6 months ago a1c was 6.0.  -  She has been working on diet and exercise for prevention of diabetes. She has been walking regularly, but not as much recently with the cold weather.  Pt is currently maintained on the following medications for diabetes: NA, none.  Medication compliance - NA, pt is not on medications today.  Home glucose readings range 120 or below, which she checks fasting and occasionally at night.   She has been trying to not eat after  midnight.  She states this is hard for her because she gets off work at midnight.    Skin: She is also requesting a refill of desoximetasone cream 0.25%, as this has expired. She uses this for her eczema and it works well for her.  She has had no vitiligo or other side effects.  She rarely uses it though.  The prescription is from 2017   Vit D: She is compliant with her vitamin D supplements.    Mood:  No problems or change.    Foot: She has been seeing a podiatrist for a tendonitis/plantar fasciitis issue of her right foot that we send her to.Marland Kitchen He recently gave her sole inserts for her shoes-made her orthotics. She states recently she "felt a pop on  the bottom of her foot "and "part of her foot turned dark blue and she now has a numbness and tingling to the area ".     Her pain is not worse when walking.    Gyn: Pt sees a gynecologist and her mammogram/pap smear are all UTD.    Last A1C in the office was:  Lab Results  Component Value Date   HGBA1C 6.0 07/14/2017   HGBA1C 6.0 (H) 12/17/2016   HGBA1C 6.3 06/10/2016    Lab Results  Component Value Date   LDLCALC 122 (H) 12/17/2016   CREATININE 0.95 09/24/2016      Wt Readings from Last 3 Encounters:  07/14/17 190 lb 6.4 oz (86.4 kg)  04/20/17 192 lb 6.4 oz (87.3 kg)  01/20/17 188 lb (85.3 kg)    BP Readings from Last 3 Encounters:  07/14/17 136/87  04/20/17 128/88  01/20/17 112/84    Pulse Readings from Last 3 Encounters:  07/14/17 98  04/20/17 96  01/20/17 80     Last 3 blood pressure readings in our office are as follows: BP Readings from Last 3 Encounters:  07/14/17 136/87  04/20/17 128/88  01/20/17 112/84    Pulse Readings from Last 3 Encounters:  07/14/17 98  04/20/17 96  01/20/17 80    Filed Weights   07/14/17 1328  Weight: 190 lb 6.4 oz (86.4 kg)      Patient Care Team    Relationship Specialty Notifications Start End  Thomasene Lotpalski, Amalio Loe, DO PCP - General Family Medicine  06/05/16       Lab Results  Component Value Date   CREATININE 0.95 09/24/2016   BUN 11 09/24/2016   NA 140 09/24/2016   K 4.5 09/24/2016   CL 95 (L) 09/24/2016   CO2 29 09/24/2016    Lab Results  Component Value Date   CHOL 200 (H) 12/17/2016   CHOL 194 06/10/2016    Lab Results  Component Value Date   HDL 61 12/17/2016   HDL 61 06/10/2016    Lab Results  Component Value Date   LDLCALC 122 (H) 12/17/2016   LDLCALC 116 (H) 06/10/2016    Lab Results  Component Value Date   TRIG 85 12/17/2016   TRIG 86 06/10/2016    Lab Results  Component Value Date   CHOLHDL 3.3 12/17/2016   CHOLHDL 3.2 06/10/2016    No results found for: LDLDIRECT ===============================================================   Patient Active Problem List   Diagnosis Date Noted  . Pre-diabetes/ glucose intolerance  (new onset)  06/20/2016    Priority: High  . Elevated LDL cholesterol level 06/20/2016    Priority: High  . Morbid obesity due to excess calories ([name]) 06/20/2016    Priority: High  . Essential hypertension 06/05/2016    Priority: High  . Vaccination not carried out because of patient refusal 06/20/2016    Priority: Medium  . Cough variant asthma 06/05/2016    Priority: Medium  . Sleep apnea 06/05/2016    Priority: Medium  . Heart murmur 06/05/2016    Priority: Medium  . GERD (gastroesophageal reflux disease) 06/05/2016    Priority: Medium  . Vitamin D deficiency 06/19/2016    Priority: Low  . Environmental and seasonal allergies 06/05/2016    Priority: Low  . Eczema 07/14/2017  . Obesity, Class I, BMI 30-34.9 11/24/2016  . Plantar fasciitis of right foot 11/24/2016  . Screening for breast cancer 09/24/2016  . Hypokalemia 09/15/2016  . Sinus tachycardia 09/15/2016  . Asthma exacerbation 09/14/2016  .  Encounter for annual routine gynecological examination 07/09/2016     Past Medical History:  Diagnosis Date  . Asthma   . GERD (gastroesophageal reflux disease)   .  Heart murmur    leaking valve  . Hypertension   . Sleep apnea      Past Surgical History:  Procedure Laterality Date  . BREAST SURGERY     reduction  . LIPOMA EXCISION    . TENDON RELEASE Right    elbow  . TONSILLECTOMY       Family History  Problem Relation Age of Onset  . Aneurysm Mother   . Stroke Father   . Hypertension Father   . Prostate cancer Father   . Diabetes Father   . Aneurysm Maternal Uncle   . Stroke Maternal Grandmother   . Hypertension Maternal Grandmother   . Heart disease Maternal Grandmother   . Diabetes Maternal Grandmother   . Aneurysm Maternal Grandfather   . Stroke Paternal Grandmother      Social History   Substance and Sexual Activity  Drug Use No  ,  Social History   Substance and Sexual Activity  Alcohol Use Yes   Comment: occasionally  ,  Social History   Tobacco Use  Smoking Status Never Smoker  Smokeless Tobacco Never Used  ,    Current Outpatient Medications on File Prior to Visit  Medication Sig Dispense Refill  . cyclobenzaprine (FLEXERIL) 10 MG tablet Take 1 tablet (10 mg total) by mouth 3 (three) times daily as needed for muscle spasms. 30 tablet 0  . FLOVENT HFA 110 MCG/ACT inhaler Inhale 2 puffs into the lungs 2 (two) times daily.    Marland Kitchen ibuprofen (ADVIL,MOTRIN) 600 MG tablet Take 1 tablet (600 mg total) by mouth every 8 (eight) hours as needed. 30 tablet 0  . loratadine (CLARITIN) 10 MG tablet Take 1 tablet (10 mg total) by mouth daily. 30 tablet 0  . losartan-hydrochlorothiazide (HYZAAR) 50-12.5 MG tablet Take 1 tablet by mouth daily. 90 tablet 0  . montelukast (SINGULAIR) 10 MG tablet TAKE 1 TABLET BY MOUTH EVERYDAY AT BEDTIME 90 tablet 1  . PROAIR HFA 108 (90 Base) MCG/ACT inhaler 1 PUFF INHALE EVERY 4-6 HOURS AS NEEDED (PRN) 8.5 Inhaler 0  . Vitamin D, Ergocalciferol, (DRISDOL) 50000 units CAPS capsule TAKE ONE CAPSULE BY MOUTH ONE TIME PER WEEK 30 capsule 0   No current facility-administered medications on file  prior to visit.      Allergies  Allergen Reactions  . Ultram [Tramadol Hcl] Other (See Comments)    syncope  . Amoxicillin Hives and Swelling  . Penicillins Hives and Swelling  . Sulfa Antibiotics Hives  . Peanut Oil Other (See Comments)    Unknown, from allergy testing  . Sesame Oil Hives  . Other Other (See Comments)    PEANUTS AND ALMONDS     Review of Systems:   General:  Denies fever, chills Optho/Auditory:   Denies visual changes, blurred vision Respiratory:   Denies SOB, cough, wheeze, DIB  Cardiovascular:   Denies chest pain, palpitations, painful respirations Gastrointestinal:   Denies nausea, vomiting, diarrhea.  Endocrine:     Denies new hot or cold intolerance Musculoskeletal:  Denies joint swelling, gait issues, or new unexplained myalgias/ arthralgias Skin:  Denies rash, suspicious lesions  Neurological:    Denies dizziness, unexplained weakness.  Psychiatric/Behavioral:   Denies mood changes  Objective:    Blood pressure 136/87, pulse 98, height 5\' 1"  (1.549 m), weight 190 lb 6.4 oz (  86.4 kg), last menstrual period 06/23/2017, SpO2 99 %. Body mass index is 35.98 kg/m. General: Well Developed, well nourished, and in no acute distress.  HEENT: Normocephalic, atraumatic, pupils equal round reactive to light, neck supple, No carotid bruits, no JVD Skin: Warm and dry, cap RF less 2 sec. no ecchymosis swelling or rash appreciated. Cardiac: Regular rate and rhythm, S1, S2 WNL's, no murmurs rubs or gallops Respiratory: ECTA B/L, Not using accessory muscles, speaking in full sentences. NeuroM-Sk: Ambulates w/o assistance, moves ext * 4 w/o difficulty, sensation grossly intact.  Foot- Tenderness of medial aspect of mid plantar fascia, slight decreased sensation to medial aspect first toe distally to ligth touch. Otherwise normal exam without color or temperature change appreciated.  No swelling noted and absolutely no difference in appearance versus opposite foot. Ext:  scant edema b/l lower ext Psych: No HI/SI, judgement and insight good, Euthymic mood. Full Affect.

## 2017-07-17 ENCOUNTER — Other Ambulatory Visit: Payer: Self-pay | Admitting: Family Medicine

## 2017-07-17 DIAGNOSIS — R7303 Prediabetes: Secondary | ICD-10-CM

## 2017-07-23 ENCOUNTER — Ambulatory Visit: Payer: 59 | Admitting: Internal Medicine

## 2017-08-29 ENCOUNTER — Other Ambulatory Visit: Payer: Self-pay | Admitting: Family Medicine

## 2017-08-29 DIAGNOSIS — R7303 Prediabetes: Secondary | ICD-10-CM

## 2017-09-07 ENCOUNTER — Telehealth: Payer: Self-pay | Admitting: Family Medicine

## 2017-09-07 NOTE — Telephone Encounter (Signed)
Called and spoke to the patient.  She states that she did have a headache yesterday.  Patient denies headache today.  Patient also denies chest pains or dizziness.  She has noticed some shortness of breath after activity (such as walk) x 1-2 weeks.  Patient is still having pain under left arm that is worse when laying on her right side, she states that this has been there for awhile and has spoken to Dr. Sharee Holsterpalski about it in the past.   Patient currently takes losartan- HCTZ 50/12.5 mg daily for blood pressure and patient was last seen in the office on 07/14/2017.  Please advise. MPulliam, CMA/RT(R)

## 2017-09-07 NOTE — Telephone Encounter (Signed)
Patient was informed to left Dr. Val Eagle know if her BP started to run high then normal, this is the case. Last night she recorded it at 182/102 but finally leveled out to 153/95 before bed. She took it again this morning and it was at 129/89.

## 2017-09-07 NOTE — Telephone Encounter (Signed)
Spoke to Dr. Sharee Holsterpalski and she would like to see the patient for a follow up before her next appt. In April.  In the meantime Dr. Sharee Holsterpalski said that we could increase BP medication to 100-12.5 mg if the patient would like.  Called and spoke to patient and offered patient an open appointment tomorrow 09/08/2017 at 11 AM.  Patient agreed to come in for this appointment and will wait to adjust medication at that time.  Patient will continue to monitor BP and symptoms and patient was advise to go to ED or [facility] if symptoms worsen or new symptoms develop.  MPulliam, CMA/RT(R)

## 2017-09-08 ENCOUNTER — Encounter: Payer: Self-pay | Admitting: Family Medicine

## 2017-09-08 ENCOUNTER — Ambulatory Visit (INDEPENDENT_AMBULATORY_CARE_PROVIDER_SITE_OTHER): Payer: 59 | Admitting: Family Medicine

## 2017-09-08 VITALS — BP 142/92 | HR 88 | Ht 61.5 in | Wt 194.0 lb

## 2017-09-08 DIAGNOSIS — J4541 Moderate persistent asthma with (acute) exacerbation: Secondary | ICD-10-CM

## 2017-09-08 DIAGNOSIS — H6980 Other specified disorders of Eustachian tube, unspecified ear: Secondary | ICD-10-CM

## 2017-09-08 DIAGNOSIS — J3089 Other allergic rhinitis: Secondary | ICD-10-CM | POA: Diagnosis not present

## 2017-09-08 DIAGNOSIS — I10 Essential (primary) hypertension: Secondary | ICD-10-CM

## 2017-09-08 DIAGNOSIS — J324 Chronic pansinusitis: Secondary | ICD-10-CM | POA: Diagnosis not present

## 2017-09-08 MED ORDER — PREDNISONE 20 MG PO TABS: ORAL_TABLET | ORAL | 0 refills | Status: DC

## 2017-09-08 MED ORDER — FLUTICASONE PROPIONATE HFA 220 MCG/ACT IN AERO: INHALATION_SPRAY | RESPIRATORY_TRACT | 12 refills | Status: DC

## 2017-09-08 MED ORDER — METOPROLOL SUCCINATE ER 50 MG PO TB24: 50.0000 mg | ORAL_TABLET | Freq: Every day | ORAL | 0 refills | Status: DC

## 2017-09-08 NOTE — Progress Notes (Signed)
Impression and Recommendations:    1. Essential hypertension   2. Moderate persistent asthma with exacerbation   3. Environmental and seasonal allergies   4. Chronic pansinusitis   5. ETD (Eustachian tube dysfunction), unspecified laterality   6. Morbid obesity due to excess calories ([name])     1. Essential hypertension:  -start metoprolol QD to which pt agreed. This is a cardioselective beta blocker that should not interfere with her asthma.  -BP is slightly elevated in office today and has been fluctuating somewhat with some highs and lows, but otherwise well-controlled at home. Believe her highs are somewhat due to recent deaths in her family and stress. She reports some HA that is related to having BP highs, which the metoprolol should also help with.  -continue other BP meds as listed below.  -work on diet, exercise, and weight loss instead of starting lasix, to which pt agrees.  -Goal BP: be consistently <130/80  2. Moderate persistent asthma with exacerbation  -continue using your albuterol prn prior to exercise.   3. Environmental and seasonal allergies: 4. ETD 5. Chronic pansinusitis -add flonase -Start prednisone taper. Told pt this may cause her BP to rise.  -increase flovent from 110 mcg to 220 mcg 2 puffs BID during the seasonal allergy season, but go back to 110 when it is not allergy season.  -take allegra and singulair daily. -Do neti pot or AYR sinus rinses twice daily, followed by 1 spray of flonase in each nostril.  -ambulatory referral given to ENT.   6. Morbid obesity: -recommended pt to lose weight.  -AHA exercise and dietary guidelines discussed.     Education and routine counseling performed. Handouts provided.  Orders Placed This Encounter  Procedures  . Ambulatory referral to ENT    Meds ordered this encounter  Medications  . metoprolol succinate (TOPROL XL) 50 MG 24 hr tablet    Sig: Take 1 tablet (50 mg total) by mouth daily. Take  with or immediately following a meal.    Dispense:  30 tablet    Refill:  0  . fluticasone (FLOVENT HFA) 220 MCG/ACT inhaler    Sig: 2 puffs twice daily    Dispense:  1 Inhaler    Refill:  12  . predniSONE (DELTASONE) 20 MG tablet    Sig: Take 3 tabs po * 2 days, then 2 tabs for 2 d, then 1 tab 2 d, then 1/2 tab 2 days.    Dispense:  15 tablet    Refill:  0     The patient was counseled, risk factors were discussed, anticipatory guidance given.  Gross side effects, risk and benefits, and alternatives of medications discussed with patient.  Patient is aware that all medications have potential side effects and we are unable to predict every side effect or drug-drug interaction that may occur.  Expresses verbal understanding and consents to current therapy plan and treatment regimen.  Return for 4-6 wks- added BP med; inc flovent and short course Pred for all.  Please see AVS handed out to patient at the end of our visit for further patient instructions/ counseling done pertaining to today's office visit.    Note: This document was prepared using Dragon voice recognition software and may include unintentional dictation errors.  This document serves as a record of services personally performed by Thomasene Lot, DO. It was created on her behalf by Thelma Barge, a trained medical scribe. The creation of this record is based on the  scribe's personal observations and the provider's statements to them.   I have reviewed the above medical documentation for accuracy and completeness and I concur.  Thomasene Lot 09/13/17 5:39 PM   Subjective:    HPI: Paula Lambert is a 47 y.o. female who presents to [facility] today for follow up for HTN and sinus problems.  SOB/Sinus problems She has SOB when she climbs her stairs at home. She walks 2-3 miles a day, which is not different than normal. She tracks her daily steps. Pt has a h/o asthma. She is taking singulair  qd and albuterol PRN, and flovent 2 puffs qd. She takes claritin 10mg  qd. She also states she has pressure in here ears and chronic sinus problems. She reports she had an MRI done at her last healthcare provider in [place] where they found a small, 1-2 mm cyst somewhere in her head/sinuses. She states sometimes when she sneezes, she has wheezing fits. She has been hospitalized before due to her asthma and seasonal allergies.   She denies CP and chest tightness.  HTN:  -  Her blood pressure has been controlled at home.   - Patient reports good compliance with blood pressure medications  Over the past few weeks, she has been 130s/80s on average. Pulse average 70s-80s. She reports some highs of 175/101. She has some fluctuations.  She reports having many deaths in the family in the last 2 months.   - Denies medication S-E   - Smoking Status noted   - She denies new onset of: chest pain, exercise intolerance, dizziness, visual changes, lower extremity swelling or claudication.   She states she retains fluid often. She has been on a stronger diuretic before in the past.  She has HA's at time, and she states she knows her BP is elevated at that time because she checks it.    Last 3 blood pressure readings in our office are as follows: BP Readings from Last 3 Encounters:  09/08/17 (!) 142/92  07/14/17 136/87  04/20/17 128/88    Pulse Readings from Last 3 Encounters:  09/08/17 88  07/14/17 98  04/20/17 96    Filed Weights   09/08/17 1138  Weight: 194 lb (88 kg)      Patient Care Team    Relationship Specialty Notifications Start End  Thomasene Lot, DO PCP - General Family Medicine  06/05/16      Lab Results  Component Value Date   CREATININE 0.95 09/24/2016   BUN 11 09/24/2016   NA 140 09/24/2016   K 4.5 09/24/2016   CL 95 (L) 09/24/2016   CO2 29 09/24/2016    Lab Results  Component Value Date   CHOL 200 (H) 12/17/2016   CHOL 194 06/10/2016    Lab  Results  Component Value Date   HDL 61 12/17/2016   HDL 61 06/10/2016    Lab Results  Component Value Date   LDLCALC 122 (H) 12/17/2016   LDLCALC 116 (H) 06/10/2016    Lab Results  Component Value Date   TRIG 85 12/17/2016   TRIG 86 06/10/2016    Lab Results  Component Value Date   CHOLHDL 3.3 12/17/2016   CHOLHDL 3.2 06/10/2016    No results found for: LDLDIRECT ===================================================================   Patient Active Problem List   Diagnosis Date Noted  . Pre-diabetes/ glucose intolerance  (new onset)  06/20/2016    Priority: High  . Elevated LDL cholesterol level 06/20/2016    Priority:  High  . Morbid obesity due to excess calories ([name]) 06/20/2016    Priority: High  . Essential hypertension 06/05/2016    Priority: High  . Vaccination not carried out because of patient refusal 06/20/2016    Priority: Medium  . Cough variant asthma 06/05/2016    Priority: Medium  . Sleep apnea 06/05/2016    Priority: Medium  . Heart murmur 06/05/2016    Priority: Medium  . GERD (gastroesophageal reflux disease) 06/05/2016    Priority: Medium  . Chronic pansinusitis 09/08/2017    Priority: Low  . Vitamin D deficiency 06/19/2016    Priority: Low  . Environmental and seasonal allergies 06/05/2016    Priority: Low  . ETD (Eustachian tube dysfunction), unspecified laterality 09/08/2017  . Eczema 07/14/2017  . Plantar fasciitis of right foot 11/24/2016  . Screening for breast cancer 09/24/2016  . Hypokalemia 09/15/2016  . Sinus tachycardia 09/15/2016  . Asthma exacerbation 09/14/2016  . Encounter for annual routine gynecological examination 07/09/2016     Past Medical History:  Diagnosis Date  . Asthma   . GERD (gastroesophageal reflux disease)   . Heart murmur    leaking valve  . Hypertension   . Sleep apnea      Past Surgical History:  Procedure Laterality Date  . BREAST SURGERY     reduction  . LIPOMA EXCISION    . TENDON  RELEASE Right    elbow  . TONSILLECTOMY       Family History  Problem Relation Age of Onset  . Aneurysm Mother   . Stroke Father   . Hypertension Father   . Prostate cancer Father   . Diabetes Father   . Aneurysm Maternal Uncle   . Stroke Maternal Grandmother   . Hypertension Maternal Grandmother   . Heart disease Maternal Grandmother   . Diabetes Maternal Grandmother   . Aneurysm Maternal Grandfather   . Stroke Paternal Grandmother      Social History   Substance and Sexual Activity  Drug Use No  ,  Social History   Substance and Sexual Activity  Alcohol Use Yes   Comment: occasionally  ,  Social History   Tobacco Use  Smoking Status Never Smoker  Smokeless Tobacco Never Used  ,    Current Outpatient Medications on File Prior to Visit  Medication Sig Dispense Refill  . cyclobenzaprine (FLEXERIL) 10 MG tablet Take 1 tablet (10 mg total) by mouth 3 (three) times daily as needed for muscle spasms. 30 tablet 0  . desoximetasone (TOPICORT) 0.25 % cream Apply 1 application topically 2 (two) times daily. 30 g 0  . glucose blood (ONE TOUCH ULTRA TEST) test strip USE ONE STRIP TO CHECK BLOOD SUGARS EVERY MORNING FASTING AND 2 HOURS AFTER LARGEST MEAL. 200 each 3  . ibuprofen (ADVIL,MOTRIN) 600 MG tablet Take 1 tablet (600 mg total) by mouth every 8 (eight) hours as needed. 30 tablet 0  . loratadine (CLARITIN) 10 MG tablet Take 1 tablet (10 mg total) by mouth daily. 30 tablet 0  . losartan-hydrochlorothiazide (HYZAAR) 50-12.5 MG tablet Take 1 tablet by mouth daily. 90 tablet 0  . montelukast (SINGULAIR) 10 MG tablet TAKE 1 TABLET BY MOUTH EVERYDAY AT BEDTIME 90 tablet 1  . ONETOUCH DELICA LANCETS FINE MISC USE ONE LANCET TO CHECK BLOOD SUGARS EVERY MORNING FASTING AND 2 HOURS AFTER LARGEST MEAL. 100 each 4  . PROAIR HFA 108 (90 Base) MCG/ACT inhaler 1 PUFF INHALE EVERY 4-6 HOURS AS NEEDED (PRN) 8.5 Inhaler 0  .  Vitamin D, Ergocalciferol, (DRISDOL) 50000 units CAPS capsule  TAKE ONE CAPSULE BY MOUTH ONE TIME PER WEEK 30 capsule 0   No current facility-administered medications on file prior to visit.      Allergies  Allergen Reactions  . Ultram [Tramadol Hcl] Other (See Comments)    syncope  . Amoxicillin Hives and Swelling  . Penicillins Hives and Swelling  . Sulfa Antibiotics Hives  . Peanut Oil Other (See Comments)    Unknown, from allergy testing  . Sesame Oil Hives  . Other Other (See Comments)    PEANUTS AND ALMONDS     Review of Systems:   General:  Denies fever, chills Optho/Auditory:   Denies visual changes, blurred vision Respiratory:   Denies cough, wheeze, DIB  Cardiovascular:   Denies chest pain, palpitations, painful respirations Gastrointestinal:   Denies nausea, vomiting, diarrhea.  Endocrine:     Denies new hot or cold intolerance Musculoskeletal:  Denies joint swelling, gait issues, or new unexplained myalgias/ arthralgias Skin:  Denies rash, suspicious lesions  Neurological:    Denies dizziness, unexplained weakness, numbness  Psychiatric/Behavioral:   Denies mood changes  Objective:    Blood pressure (!) 142/92, pulse 88, height 5' 1.5" (1.562 m), weight 194 lb (88 kg), last menstrual period 08/18/2017, SpO2 100 %.  Body mass index is 36.06 kg/m.  General: Well Developed, well nourished, and in no acute distress.  HEENT: Normocephalic, atraumatic, pupils equal round reactive to light, neck supple, No carotid bruits, no JVD Ears slight bulging of TM on the R.  Nares edematous, clear discharge, mild redness, mild erythema. Sinuses nontender. Skin: Warm and dry, cap RF less 2 sec Cardiac: Regular rate and rhythm, S1, S2 WNL's, no murmurs rubs or gallops Respiratory: ECTA B/L, Not using accessory muscles, speaking in full sentences. Decreased aeration with decreased exhalation phase. Air trapping with exhalation.  NeuroM-Sk: Ambulates w/o assistance, moves ext * 4 w/o difficulty, sensation grossly intact.  Ext: scant edema  b/l lower ext Psych: No HI/SI, judgement and insight good, Euthymic mood. Full Affect.

## 2017-09-08 NOTE — Patient Instructions (Signed)
Please be aware that you can use your albuterol preexercise to help prevent an exacerbation of your asthma.  As well as were going up on your Flovent during this change in season then you can go back to your standard 110 mcg dose that you are on prior once the allergy season is over.    Continue to please we will see you back in about 4-6 weeks to see how you are doing with this new blood pressure medicine monitor your blood pressure as were adding on the metoprolol.  This is a cardioselective beta blocker which should not interfere with your asthma.  And the asthma and allergy treatment  Once you are done with the steroids you can add Flonase into your nasal passages 1 spray each nostril twice daily after your sinus rinses.  It is very important you do the sinus rinse to get out any pollen dust etc. are your nasal passages before it gets to set up your sinuses and into eustachian tubes and causes that congestion and headache etc.

## 2017-09-30 ENCOUNTER — Other Ambulatory Visit: Payer: Self-pay | Admitting: Family Medicine

## 2017-10-05 ENCOUNTER — Other Ambulatory Visit: Payer: 59

## 2017-10-05 DIAGNOSIS — R7303 Prediabetes: Secondary | ICD-10-CM | POA: Diagnosis not present

## 2017-10-05 DIAGNOSIS — I10 Essential (primary) hypertension: Secondary | ICD-10-CM

## 2017-10-05 DIAGNOSIS — E559 Vitamin D deficiency, unspecified: Secondary | ICD-10-CM | POA: Diagnosis not present

## 2017-10-05 DIAGNOSIS — E7800 Pure hypercholesterolemia, unspecified: Secondary | ICD-10-CM

## 2017-10-05 DIAGNOSIS — E876 Hypokalemia: Secondary | ICD-10-CM

## 2017-10-06 LAB — CBC WITH DIFFERENTIAL/PLATELET
BASOS ABS: 0 10*3/uL (ref 0.0–0.2)
Basos: 0 %
EOS (ABSOLUTE): 0.1 10*3/uL (ref 0.0–0.4)
EOS: 1 %
Hematocrit: 36.3 % (ref 34.0–46.6)
Hemoglobin: 11.8 g/dL (ref 11.1–15.9)
IMMATURE GRANULOCYTES: 0 %
Immature Grans (Abs): 0 10*3/uL (ref 0.0–0.1)
LYMPHS ABS: 2.5 10*3/uL (ref 0.7–3.1)
Lymphs: 45 %
MCH: 28.1 pg (ref 26.6–33.0)
MCHC: 32.5 g/dL (ref 31.5–35.7)
MCV: 86 fL (ref 79–97)
MONOS ABS: 0.4 10*3/uL (ref 0.1–0.9)
Monocytes: 8 %
NEUTROS PCT: 46 %
Neutrophils Absolute: 2.6 10*3/uL (ref 1.4–7.0)
PLATELETS: 285 10*3/uL (ref 150–379)
RBC: 4.2 x10E6/uL (ref 3.77–5.28)
RDW: 15.3 % (ref 12.3–15.4)
WBC: 5.5 10*3/uL (ref 3.4–10.8)

## 2017-10-06 LAB — COMPREHENSIVE METABOLIC PANEL
ALK PHOS: 66 IU/L (ref 39–117)
ALT: 14 IU/L (ref 0–32)
AST: 16 IU/L (ref 0–40)
Albumin/Globulin Ratio: 1.5 (ref 1.2–2.2)
Albumin: 4.1 g/dL (ref 3.5–5.5)
BUN/Creatinine Ratio: 8 — ABNORMAL LOW (ref 9–23)
BUN: 7 mg/dL (ref 6–24)
Bilirubin Total: 0.5 mg/dL (ref 0.0–1.2)
CALCIUM: 9 mg/dL (ref 8.7–10.2)
CO2: 25 mmol/L (ref 20–29)
Chloride: 102 mmol/L (ref 96–106)
Creatinine, Ser: 0.9 mg/dL (ref 0.57–1.00)
GFR calc Af Amer: 89 mL/min/{1.73_m2} (ref >59)
GFR calc non Af Amer: 77 mL/min/{1.73_m2} (ref >59)
GLOBULIN, TOTAL: 2.7 g/dL (ref 1.5–4.5)
GLUCOSE: 122 mg/dL — AB (ref 65–99)
Potassium: 3.8 mmol/L (ref 3.5–5.2)
SODIUM: 140 mmol/L (ref 134–144)
Total Protein: 6.8 g/dL (ref 6.0–8.5)

## 2017-10-06 LAB — VITAMIN D 25 HYDROXY (VIT D DEFICIENCY, FRACTURES): Vit D, 25-Hydroxy: 34.6 ng/mL (ref 30.0–100.0)

## 2017-10-06 LAB — LIPID PANEL
CHOL/HDL RATIO: 3.5 ratio (ref 0.0–4.4)
CHOLESTEROL TOTAL: 189 mg/dL (ref 100–199)
HDL: 54 mg/dL (ref >39)
LDL CALC: 119 mg/dL — AB (ref 0–99)
Triglycerides: 79 mg/dL (ref 0–149)
VLDL Cholesterol Cal: 16 mg/dL (ref 5–40)

## 2017-10-06 LAB — TSH: TSH: 5.12 u[IU]/mL — AB (ref 0.450–4.500)

## 2017-10-06 LAB — HEMOGLOBIN A1C
ESTIMATED AVERAGE GLUCOSE: 146 mg/dL
HEMOGLOBIN A1C: 6.7 % — AB (ref 4.8–5.6)

## 2017-10-11 DIAGNOSIS — J309 Allergic rhinitis, unspecified: Secondary | ICD-10-CM | POA: Diagnosis not present

## 2017-10-12 ENCOUNTER — Encounter: Payer: Self-pay | Admitting: Family Medicine

## 2017-10-12 ENCOUNTER — Ambulatory Visit (INDEPENDENT_AMBULATORY_CARE_PROVIDER_SITE_OTHER): Payer: 59 | Admitting: Family Medicine

## 2017-10-12 VITALS — BP 128/84 | HR 84 | Ht 62.0 in | Wt 195.0 lb

## 2017-10-12 DIAGNOSIS — I152 Hypertension secondary to endocrine disorders: Secondary | ICD-10-CM

## 2017-10-12 DIAGNOSIS — E1159 Type 2 diabetes mellitus with other circulatory complications: Secondary | ICD-10-CM | POA: Diagnosis not present

## 2017-10-12 DIAGNOSIS — E118 Type 2 diabetes mellitus with unspecified complications: Secondary | ICD-10-CM | POA: Diagnosis not present

## 2017-10-12 DIAGNOSIS — I10 Essential (primary) hypertension: Secondary | ICD-10-CM | POA: Diagnosis not present

## 2017-10-12 DIAGNOSIS — E782 Mixed hyperlipidemia: Secondary | ICD-10-CM

## 2017-10-12 DIAGNOSIS — R7989 Other specified abnormal findings of blood chemistry: Secondary | ICD-10-CM

## 2017-10-12 DIAGNOSIS — E1169 Type 2 diabetes mellitus with other specified complication: Secondary | ICD-10-CM | POA: Diagnosis not present

## 2017-10-12 DIAGNOSIS — E559 Vitamin D deficiency, unspecified: Secondary | ICD-10-CM

## 2017-10-12 MED ORDER — METFORMIN HCL 500 MG PO TABS: 500.0000 mg | ORAL_TABLET | Freq: Two times a day (BID) | ORAL | 3 refills | Status: DC

## 2017-10-12 MED ORDER — ATORVASTATIN CALCIUM 40 MG PO TABS
40.0000 mg | ORAL_TABLET | Freq: Every day | ORAL | 3 refills | Status: DC
Start: 2017-10-12 — End: 2018-10-11

## 2017-10-12 MED ORDER — AMBULATORY NON FORMULARY MEDICATION: 0 refills | Status: AC

## 2017-10-12 MED ORDER — VITAMIN D (ERGOCALCIFEROL) 1.25 MG (50000 UNIT) PO CAPS: ORAL_CAPSULE | ORAL | 3 refills | Status: DC

## 2017-10-12 MED ORDER — BLOOD GLUCOSE MONITOR KIT: PACK | 0 refills | Status: AC

## 2017-10-12 NOTE — Patient Instructions (Addendum)
Please start off by checking your blood sugars 2 hours after largest meal the day as well as fasting blood sugars.  Keep a log and write it down.  Bring in next office visit.  Start your metformin at one half a tablet twice daily.  If you tolerate this medicine well after 1-2 weeks, then go to 1 full tablet twice daily.  Start your Lipitor at 1/2 tablet before bedtime.  If you tolerate it well after 1-2 weeks then go to 1 full tablet prior to bedtime.   -Your max heart rate is 175 bpm, you should be exercising in the zone of 122-140 bpm which would be 70 to 80% of your max heart rate.  This is the ideal heart rate to exercise at to lose weight    Diabetes Mellitus and Standards of Medical Care  Managing diabetes (diabetes mellitus) can be complicated. Your diabetes treatment may be managed by a team of health care providers, including:  A diet and nutrition specialist (registered dietitian).  A nurse.  A certified diabetes educator (CDE).  A diabetes specialist (endocrinologist).  An eye doctor.  A primary care provider.  A dentist.  Your health care providers follow a schedule in order to help you get the best quality of care. The following schedule is a general guideline for your diabetes management plan. Your health care providers may also give you more specific instructions.  HbA1c (hemoglobin A1c) test This test provides information about blood sugar (glucose) control over the previous 2-3 months. It is used to check whether your diabetes management plan needs to be adjusted.  If you are meeting your treatment goals, this test is done at least 2 times a year.  If you are not meeting treatment goals or if your treatment goals have changed, this test is done 4 times a year.  Blood pressure test  This test is done at every routine medical visit. For most people, the goal is less than 130/80. Ask your health care provider what your goal blood pressure should be.  Dental and  eye exams  Visit your dentist two times a year.  If you have type 1 diabetes, get an eye exam 3-5 years after you are diagnosed, and then once a year after your first exam. ? If you were diagnosed with type 1 diabetes as a child, get an eye exam when you are age 58 or older and have had diabetes for 3-5 years. After the first exam, you should get an eye exam once a year.  If you have type 2 diabetes, have an eye exam as soon as you are diagnosed, and then once a year after your first exam.  Foot care exam  Visual foot exams are done at every routine medical visit. The exams check for cuts, bruises, redness, blisters, sores, or other problems with the feet.  A complete foot exam is done by your health care provider once a year. This exam includes an inspection of the structure and skin of your feet, and a check of the pulses and sensation in your feet. ? Type 1 diabetes: Get your first exam 3-5 years after diagnosis. ? Type 2 diabetes: Get your first exam as soon as you are diagnosed.  Check your feet every day for cuts, bruises, redness, blisters, or sores. If you have any of these or other problems that are not healing, contact your health care provider.  Kidney function test (urine microalbumin)  This test is done once a year. ?  Type 1 diabetes: Get your first test 5 years after diagnosis. ? Type 2 diabetes: Get your first test as soon as you are diagnosed._  If you have chronic kidney disease (CKD), get a serum creatinine and estimated glomerular filtration rate (eGFR) test once a year.  Lipid profile (cholesterol, HDL, LDL, triglycerides)  This test should be done when you are diagnosed with diabetes, and every 5 years after the first test. If you are on medicines to lower your cholesterol, you may need to get this test done every year. ? The goal for LDL is less than 100 mg/dL (5.5 mmol/L). If you are at high risk, the goal is less than 70 mg/dL (3.9 mmol/L). ? The goal for HDL is  40 mg/dL (2.2 mmol/L) for men and 50 mg/dL(2.8 mmol/L) for women. An HDL cholesterol of 60 mg/dL (3.3 mmol/L) or higher gives some protection against heart disease. ? The goal for triglycerides is less than 150 mg/dL (8.3 mmol/L).  Immunizations  The yearly flu (influenza) vaccine is recommended for everyone 6 months or older who has diabetes.  The pneumonia (pneumococcal) vaccine is recommended for everyone 2 years or older who has diabetes. If you are 75 or older, you may get the pneumonia vaccine as a series of two separate shots.  The hepatitis B vaccine is recommended for adults shortly after they have been diagnosed with diabetes.  The Tdap (tetanus, diphtheria, and pertussis) vaccine should be given: ? According to normal childhood vaccination schedules, for children. ? Every 10 years, for adults who have diabetes.  The shingles vaccine is recommended for people who have had chicken pox and are 50 years or older.  Mental and emotional health  Screening for symptoms of eating disorders, anxiety, and depression is recommended at the time of diagnosis and afterward as needed. If your screening shows that you have symptoms (you have a positive screening result), you may need further evaluation and be referred to a mental health care provider.  Diabetes self-management education  Education about how to manage your diabetes is recommended at diagnosis and ongoing as needed.  Treatment plan  Your treatment plan will be reviewed at every medical visit.  Summary  Managing diabetes (diabetes mellitus) can be complicated. Your diabetes treatment may be managed by a team of health care providers.  Your health care providers follow a schedule in order to help you get the best quality of care.  Standards of care including having regular physical exams, blood tests, blood pressure monitoring, immunizations, screening tests, and education about how to manage your diabetes.  Your health  care providers may also give you more specific instructions based on your individual health.      Type 2 Diabetes Mellitus, Self Care, Adult Caring for yourself after you have been diagnosed with type 2 diabetes (type 2 diabetes mellitus) means keeping your blood sugar (glucose) under control with a balance of:  Nutrition.  Exercise.  Lifestyle changes.  Medicines or insulin, if necessary.  Support from your team of health care providers and others.  The following information explains what you need to know to manage your diabetes at home. What do I need to do to manage my blood glucose?  Check your blood glucose every day, as often as told by your health care provider.  Contact your health care provider if your blood glucose is above your target for 2 tests in a row.  Have your A1c (hemoglobin A1c) level checked at least two times a year,  or as often as told by your health care provider. Your health care provider will set individualized treatment goals for you. Generally, the goal of treatment is to maintain the following blood glucose levels:  Before meals (preprandial): 80-130 mg/dL (4.4-7.2 mmol/L).  After meals (postprandial): below 180 mg/dL (10 mmol/L).  A1c level: less than 7%.  What do I need to know about hyperglycemia and hypoglycemia? What is hyperglycemia? Hyperglycemia, also called high blood glucose, occurs when blood glucose is too high.Make sure you know the early signs of hyperglycemia, such as:  Increased thirst.  Hunger.  Feeling very tired.  Needing to urinate more often than usual.  Blurry vision.  What is hypoglycemia? Hypoglycemia, also called low blood glucose, occurswith a blood glucose level at or below 70 mg/dL (3.9 mmol/L). The risk for hypoglycemia increases during or after exercise, during sleep, during illness, and when skipping meals or not eating for a long time (fasting). It is important to know the symptoms of hypoglycemia and  treat it right away. Always have a 15-gram rapid-acting carbohydrate snack with you to treat low blood glucose. Family members and close friends should also know the symptoms and should understand how to treat hypoglycemia, in case you are not able to treat yourself. What are the symptoms of hypoglycemia? Hypoglycemia symptoms can include:  Hunger.  Anxiety.  Sweating and feeling clammy.  Confusion.  Dizziness or feeling light-headed.  Sleepiness.  Nausea.  Increased heart rate.  Headache.  Blurry vision.  Seizure.  Nightmares.  Tingling or numbness around the mouth, lips, or tongue.  A change in speech.  Decreased ability to concentrate.  A change in coordination.  Restless sleep.  Tremors or shakes.  Fainting.  Irritability.  How do I treat hypoglycemia?  If you are alert and able to swallow safely, follow the 15:15 rule:  Take 15 grams of a rapid-acting carbohydrate. Rapid-acting options include: ? 1 tube of glucose gel. ? 3 glucose pills. ? 6-8 pieces of hard candy. ? 4 oz (120 mL) of fruit juice. ? 4 oz (120 mL) of regular (not diet) soda.  Check your blood glucose 15 minutes after you take the carbohydrate.  If the repeat blood glucose level is still at or below 70 mg/dL (3.9 mmol/L), take 15 grams of a carbohydrate again.  If your blood glucose level does not increase above 70 mg/dL (3.9 mmol/L) after 3 tries, seek emergency medical care.  After your blood glucose level returns to normal, eat a meal or a snack within 1 hour.  How do I treat severe hypoglycemia? Severe hypoglycemia is when your blood glucose level is at or below 54 mg/dL (3 mmol/L). Severe hypoglycemia is an emergency. Do not wait to see if the symptoms will go away. Get medical help right away. Call your local emergency services (911 in the [place] Do not drive yourself to the hospital. If you have severe hypoglycemia and you cannot eat or drink, you may need an injection of  glucagon. A family member or close friend should learn how to check your blood glucose and how to give you a glucagon injection. Ask your health care provider if you need to have an emergency glucagon injection kit available. Severe hypoglycemia may need to be treated in a hospital. The treatment may include getting glucose through an IV tube. You may also need treatment for the cause of your hypoglycemia. Can having diabetes put me at risk for other conditions? Having diabetes can put you at risk for  other long-term (chronic) conditions, such as heart disease and kidney disease. Your health care provider may prescribe medicines to help prevent complications from diabetes. These medicines may include:  Aspirin.  Medicine to lower cholesterol.  Medicine to control blood pressure.  What else can I do to manage my diabetes? Take your diabetes medicines as told  If your health care provider prescribed insulin or diabetes medicines, take them every day.  Do not run out of insulin or other diabetes medicines that you take. Plan ahead so you always have these available.  If you use insulin, adjust your dosage based on how physically active you are and what foods you eat. Your health care provider will tell you how to adjust your dosage. Make healthy food choices  The things that you eat and drink affect your blood glucose and your insulin dosage. Making good choices helps to control your diabetes and prevent other health problems. A healthy meal plan includes eating lean proteins, complex carbohydrates, fresh fruits and vegetables, low-fat dairy products, and healthy fats. Make an appointment to see a diet and nutrition specialist (registered dietitian) to help you create an eating plan that is right for you. Make sure that you:  Follow instructions from your health care provider about eating or drinking restrictions.  Drink enough fluid to keep your urine clear or pale yellow.  Eat healthy  snacks between nutritious meals.  Track the carbohydrates that you eat. Do this by reading food labels and learning the standard serving sizes of foods.  Follow your sick day plan whenever you cannot eat or drink as usual. Make this plan in advance with your health care provider.  Stay active  Exercise regularly, as told by your health care provider. This may include:  Stretching and doing strength exercises, such as yoga or weightlifting, at least 2 times a week.  Doing at least 150 minutes of moderate-intensity or vigorous-intensity exercise each week. This could be brisk walking, biking, or water aerobics. ? Spread out your activity over at least 3 days of the week. ? Do not go more than 2 days in a row without doing some kind of physical activity.  When you start a new exercise or activity, work with your health care provider to adjust your insulin, medicines, or food intake as needed. Make healthy lifestyle choices  Do not use any tobacco products, such as cigarettes, chewing tobacco, and e-cigarettes. If you need help quitting, ask your health care provider.  If your health care provider says that alcohol is safe for you, limit alcohol intake to no more than 1 drink per day for nonpregnant women and 2 drinks per day for men. One drink equals 12 oz of beer, 5 oz of wine, or 1 oz of hard liquor.  Learn to manage stress. If you need help with this, ask your health care provider. Care for your body   Keep your immunizations up to date. In addition to getting vaccinations as told by your health care provider, it is recommended that you get vaccinated against the following illnesses: ? The flu (influenza). Get a flu shot every year. ? Pneumonia. ? Hepatitis B.  Schedule an eye exam soon after your diagnosis, and then one time every year after that.  Check your skin and feet every day for cuts, bruises, redness, blisters, or sores. Schedule a foot exam with your health care provider  once every year.  Brush your teeth and gums two times a day, and floss at least  one time a day. Visit your dentist at least once every 6 months.  Maintain a healthy weight. General instructions  Take over-the-counter and prescription medicines only as told by your health care provider.  Share your diabetes management plan with people in your workplace, school, and household.  Check your urine for ketones when you are ill and as told by your health care provider.  Ask your health care provider: ? Do I need to meet with a diabetes educator? ? Where can I find a support group for people with diabetes?  Carry a medical alert card or wear medical alert jewelry.  Keep all follow-up visits as told by your health care provider. This is important. Where to find more information: For more information about diabetes, visit:  American Diabetes Association (ADA): www.diabetes.org  American Association of Diabetes Educators (AADE): www.diabeteseducator.org/patient-resources  This information is not intended to replace advice given to you by your health care provider. Make sure you discuss any questions you have with your health care provider. Document Released: 09/30/2015 Document Revised: 11/14/2015 Document Reviewed: 07/12/2015 Elsevier Interactive Patient Education  2017 [employer].      Blood Glucose Monitoring, Adult Monitoring your blood sugar (glucose) helps you manage your diabetes. It also helps you and your health care provider determine how well your diabetes management plan is working. Blood glucose monitoring involves checking your blood glucose as often as directed, and keeping a record (log) of your results over time. Why should I monitor my blood glucose? Checking your blood glucose regularly can:  Help you understand how food, exercise, illnesses, and medicines affect your blood glucose.  Let you know what your blood glucose is at any time. You can quickly tell if you  are having low blood glucose (hypoglycemia) or high blood glucose (hyperglycemia).  Help you and your health care provider adjust your medicines as needed.  When should I check my blood glucose? Follow instructions from your health care provider about how often to check your blood glucose.   This may depend on:  The type of diabetes you have.  How well-controlled your diabetes is.  Medicines you are taking.  If you have type 1 diabetes:  Check your blood glucose at least 2 times a day.  Also check your blood glucose: ? Before every insulin injection. ? Before and after exercise. ? Between meals. ? 2 hours after a meal. ? Occasionally between 2:00 a.m. and 3:00 a.m., as directed. ? Before potentially dangerous tasks, like driving or using heavy machinery. ? At bedtime.  You may need to check your blood glucose more often, up to 6-10 times a day: ? If you use an insulin pump. ? If you need multiple daily injections (MDI). ? If your diabetes is not well-controlled. ? If you are ill. ? If you have a history of severe hypoglycemia. ? If you have a history of not knowing when your blood glucose is getting low (hypoglycemia unawareness).  If you have type 2 diabetes:  If you take insulin or other diabetes medicines, check your blood glucose at least 2 times a day.  If you are on intensive insulin therapy, check your blood glucose at least 4 times a day. Occasionally, you may also need to check between 2:00 a.m. and 3:00 a.m., as directed.  Also check your blood glucose: ? Before and after exercise. ? Before potentially dangerous tasks, like driving or using heavy machinery.  You may need to check your blood glucose more often if: ?  Your medicine is being adjusted. ? Your diabetes is not well-controlled. ? You are ill.  What is a blood glucose log?  A blood glucose log is a record of your blood glucose readings. It helps you and your health care provider: ? Look for  patterns in your blood glucose over time. ? Adjust your diabetes management plan as needed.  Every time you check your blood glucose, write down your result and notes about things that may be affecting your blood glucose, such as your diet and exercise for the day.  Most glucose meters store a record of glucose readings in the meter. Some meters allow you to download your records to a computer. How do I check my blood glucose? Follow these steps to get accurate readings of your blood glucose: Supplies needed   Blood glucose meter.  Test strips for your meter. Each meter has its own strips. You must use the strips that come with your meter.  A needle to prick your finger (lancet). Do not use lancets more than once.  A device that holds the lancet (lancing device).  A journal or log book to write down your results.  Procedure  Wash your hands with soap and water.  Prick the side of your finger (not the tip) with the lancet. Use a different finger each time.  Gently rub the finger until a small drop of blood appears.  Follow instructions that come with your meter for inserting the test strip, applying blood to the strip, and using your blood glucose meter.  Write down your result and any notes.  Alternative testing sites  Some meters allow you to use areas of your body other than your finger (alternative sites) to test your blood.  If you think you may have hypoglycemia, or if you have hypoglycemia unawareness, do not use alternative sites. Use your finger instead.  Alternative sites may not be as accurate as the fingers, because blood flow is slower in these areas. This means that the result you get may be delayed, and it may be different from the result that you would get from your finger.  The most common alternative sites are: ? Forearm. ? Thigh. ? Palm of the hand.  Additional tips  Always keep your supplies with you.  If you have questions or need help, all blood  glucose meters have a 24-hour "hotline" number that you can call. You may also contact your health care provider.  After you use a few boxes of test strips, adjust (calibrate) your blood glucose meter by following instructions that came with your meter.    The American Diabetes Association suggests the following targets for most nonpregnant adults with diabetes.  More or less stringent glycemic goals may be appropriate for each individual.  A1C: Less than 7% A1C may also be reported as eAG: Less than 154 mg/dl Before a meal (preprandial plasma glucose): 80-130 mg/dl 1-2 hours after beginning of the meal (Postprandial plasma glucose)*: Less than 180 mg/dl  *Postprandial glucose may be targeted if A1C goals are not met despite reaching preprandial glucose goals.   GOALS in short:  The goals are for the Hgb A1C to be less than 7.0 & blood pressure to be less than 130/80.    It is recommended that all diabetics are educated on and follow a healthy diabetic diet, exercise for 30 minutes 3-4 times per week (walking, biking, swimming, or machine), monitor blood glucose readings and bring that record with you to be reviewed  at your next office visit.     You should be checking fasting blood sugars- especially after you eat poorly or eat really healthy, and also check 2 hour postprandial blood sugars after largest meal of the day.    Write these down and bring in your log at each office visit.    You will need to be seen every 3 months by the provider managing your Diabetes unless told otherwise by that provider.   You will need yearly eye exams from an eye specialist and foot exams to check the nerves of your feet.  Also, your urine should be checked yearly as well to make sure excess protein is not present.   If you are checking your blood pressure at home, please record it and bring it to your next office visit.    Follow the Dietary Approaches to Stop Hypertension (DASH) diet (3 servings of  fruit and vegetables daily, whole grains, low sodium, low-fat proteins).  See below.    Lastly, when it comes to your cholesterol, the goal is to have the HDL (good cholesterol) >40, and the LDL (bad cholesterol) <100.   It is recommended that you follow a heart healthy, low saturated and trans-fat diet and exercise for 30 minutes at least 5 times a week.     (( Check out the DASH diet = 1.5 Gram Low Sodium Diet   A 1.5 gram sodium diet restricts the amount of sodium in the diet to no more than 1.5 g or 1500 mg daily.  The American Heart Association recommends Americans over the age of 2 to consume no more than 1500 mg of sodium each day to reduce the risk of developing high blood pressure.  Research also shows that limiting sodium may reduce heart attack and stroke risk.  Many foods contain sodium for flavor and sometimes as a preservative.  When the amount of sodium in a diet needs to be low, it is important to know what to look for when choosing foods and drinks.  The following includes some information and guidelines to help make it easier for you to adapt to a low sodium diet.    QUICK TIPS  Do not add salt to food.  Avoid convenience items and fast food.  Choose unsalted snack foods.  Buy lower sodium products, often labeled as "lower sodium" or "no salt added."  Check food labels to learn how much sodium is in 1 serving.  When eating at a restaurant, ask that your food be prepared with less salt or none, if possible.    READING FOOD LABELS FOR SODIUM INFORMATION  The nutrition facts label is a good place to find how much sodium is in foods. Look for products with no more than 400 mg of sodium per serving.  Remember that 1.5 g = 1500 mg.  The food label may also list foods as:  Sodium-free: Less than 5 mg in a serving.  Very low sodium: 35 mg or less in a serving.  Low-sodium: 140 mg or less in a serving.  Light in sodium: 50% less sodium in a serving. For example, if a food that  usually has 300 mg of sodium is changed to become light in sodium, it will have 150 mg of sodium.  Reduced sodium: 25% less sodium in a serving. For example, if a food that usually has 400 mg of sodium is changed to reduced sodium, it will have 300 mg of sodium.    CHOOSING FOODS  Grains  Avoid: Salted crackers and snack items. Some cereals, including instant hot cereals. Bread stuffing and biscuit mixes. Seasoned rice or pasta mixes.  Choose: Unsalted snack items. Low-sodium cereals, oats, puffed wheat and rice, shredded wheat. English muffins and bread. Pasta.  Meats  Avoid: Salted, canned, smoked, spiced, pickled meats, including fish and poultry. Bacon, ham, sausage, cold cuts, hot dogs, anchovies.  Choose: Low-sodium canned tuna and salmon. Fresh or frozen meat, poultry, and fish.  Dairy  Avoid: Processed cheese and spreads. Cottage cheese. Buttermilk and condensed milk. Regular cheese.  Choose: Milk. Low-sodium cottage cheese. Yogurt. Sour cream. Low-sodium cheese.  Fruits and Vegetables  Avoid: Regular canned vegetables. Regular canned tomato sauce and paste. Frozen vegetables in sauces. Olives. Angie Fava. Relishes. Sauerkraut.  Choose: Low-sodium canned vegetables. Low-sodium tomato sauce and paste. Frozen or fresh vegetables. Fresh and frozen fruit.  Condiments  Avoid: Canned and packaged gravies. Worcestershire sauce. Tartar sauce. Barbecue sauce. Soy sauce. Steak sauce. Ketchup. Onion, garlic, and table salt. Meat flavorings and tenderizers.  Choose: Fresh and dried herbs and spices. Low-sodium varieties of mustard and ketchup. Lemon juice. Tabasco sauce. Horseradish.    SAMPLE 1.5 GRAM SODIUM MEAL PLAN:   Breakfast / Sodium (mg)  1 cup low-fat milk / 143 mg  1 whole-wheat English muffin / 240 mg  1 tbs heart-healthy margarine / 153 mg  1 hard-boiled egg / 139 mg  1 small orange / 0 mg  Lunch / Sodium (mg)  1 cup raw carrots / 76 mg  2 tbs no salt added peanut butter / 5 mg    2 slices whole-wheat bread / 270 mg  1 tbs jelly / 6 mg   cup red grapes / 2 mg  Dinner / Sodium (mg)  1 cup whole-wheat pasta / 2 mg  1 cup low-sodium tomato sauce / 73 mg  3 oz lean ground beef / 57 mg  1 small side salad (1 cup raw spinach leaves,  cup cucumber,  cup yellow bell pepper) with 1 tsp olive oil and 1 tsp red wine vinegar / 25 mg  Snack / Sodium (mg)  1 container low-fat vanilla yogurt / 107 mg  3 graham cracker squares / 127 mg  Nutrient Analysis  Calories: 1745  Protein: 75 g  Carbohydrate: 237 g  Fat: 57 g  Sodium: 1425 mg  Document Released: 06/08/2005 Document Revised: 02/18/2011 Document Reviewed: 09/09/2009  ExitCare Patient Information 2012 [place].))    This information is not intended to replace advice given to you by your health care provider. Make sure you discuss any questions you have with your health care provider. Document Released: 06/11/2003 Document Revised: 12/27/2015 Document Reviewed: 11/18/2015 Elsevier Interactive Patient Education  2017 [employer].

## 2017-10-12 NOTE — Progress Notes (Signed)
Impression and Recommendations:    1. Type 2 diabetes mellitus with complication, without long-term current use of insulin ([place])   2. Hypertension associated with diabetes ([place])   3. Mixed diabetic hyperlipidemia associated with type 2 diabetes mellitus ([place])   4. Morbid obesity due to excess calories ([name])   5. Elevated TSH   6. Vitamin D deficiency     1.  New Diagnosis: Diabetes - A1c elevated from 6.0 to 6.7. - New diagnosis reviewed at length with the patient.  - Metformin started today.  Risks and benefits of medications discussed with patient, including alternative treatments.   Encouraged patient to read drug information handouts to further educate self about the medicine prior to starting it. Contact us prior with any questions/ concerns.  - Counseled patient on pathophysiology of disease and discussed various treatment options, which often includes dietary and lifestyle modifications as first line.  Importance of low carb/ketogenic diet discussed with patient in addition to regular exercise.   - Referral to diabetic nutritionist/educator provided today.  - Prescription for glucometer written today. - Patient should check fasting blood sugar in the morning, and 2 hours postprandial, after the biggest meal of the day.  Keep log and bring in next OV for my review.   Also, if you ever feel poorly, please check your blood pressure and blood sugar, as one or the other could be the cause of your symptoms. - Reviewed that while on the metformin, her blood sugar should regularly be under 120.  - Being a diabetic, you need yearly eye and foot exams. Make appt. for diabetic eye exam.   2.  Hypertension - Last appointment, started metoprolol QD.  Tolerates well.  Continue as prescribed. - Continue with all blood pressure medications as listed below.  - Continue working on diet, exercise, and weight loss instead of starting lasix, to which pt agrees.   - Advised that with new  diabetes diagnosis, the patient's blood pressure should be consistently under 125/80.  3.  Environmental & Seasonal Allergies, ETD, Chronic Pansinusitis - Increased Flovent last appointment from 110 mcg to 220 mcg 2 puffs BID during the seasonal allergy season. - Patient may go back to 110 when it is not allergy season.  - Patient tolerates increased dose well and notes improvement.  Continue as prescribed.  - Continue Singulair and Zyrtec as prescribed.  - Advised patient to ask her ENT specialist whether she should continue incorporating sinus rinses and Flonase into her daily allergy routine.  - Continue using your albuterol PRN prior to exercise.  4.  Vitamin D - Reviewed that 50 to 60 range is ideal. - Patient will take Vitamin D3 prescription twice weekly - on Wednesday and Sunday. - Re-check in 4 months with other labs.  5.  Hyperlipidemia - Begin half-tablet Lipitor daily at bedtime.  Risks and benefits of medication reviewed with patient today. - Emphasized critical importance of continued adequate hydration while on this medicine.  - Reviewed lipid panel with patient today.  - HDL is at 54, down from last value of 61. - Exercise to improve this value.  - LDL is at 119.  Reviewed with patient that, if diabetic, goal LDL is less than 100.  6. TSH - Elevated - discussed that this may be due to acute stress.  Patient notes recent loss of several family members. - Re-check TSH, T4, and T3 in 6 weeks.  7. General Health Maintenance - Morbid Obesity - BMI of  35.67 Explained to patient what BMI refers to, and what it means medically.    Told patient to think about it as a "medical risk stratification measurement" and how increasing BMI is associated with increasing risk/ or worsening state of various diseases such as hypertension, hyperlipidemia, diabetes, premature OA, depression etc.  American Heart Association guidelines for healthy diet, basically Mediterranean diet, and  exercise guidelines of 30 minutes 5 days per week or more discussed in detail.  Health counseling performed.  All questions answered.  Tracking [place] a heart rate monitor and begin using this to track her HR. - She should exercise in 120 to 140 HR range for 30 minutes minimum daily, as 70%-80% of max heart rate is fat burning.  - Advised patient to continue exercising to improve health.  Recommended that the patient eventually strive for at least 150 minutes of cardio per week according to the [facility].   Dietary Habits - Healthy dietary habits encouraged, including low-carb, and high amounts of lean protein in diet.  - Advised patient to not eat or drink within 3 hours of lying down.  - For example, don't eat past 11 PM if she's going to bed at 2 AM.  - Patient should also consume adequate amounts of water - half of body weight in oz of water per day.  8. Follow-Up - Re-check CMP in 6 weeks since starting metformin. - Re-check liver function in 6 weeks since starting Lipitor. - Check TSH, T4, and T3 in 6 weeks.  - Re-check A1c in 4 months - Check kidneys in 6 months. - Continue following up for regular health maintenance visits.  - Return for follow-up OV in 3 months. - Patient knows to refer to AVS, starting at half-dose and tapering up on new medications to minimize S-E. - She knows to call if she has any concerns prior to next scheduled follow-up.    Education and routine counseling performed. Handouts provided.  Orders Placed This Encounter  Procedures  . Comprehensive metabolic panel  . T4, free  . T3, free  . TSH  . Amb Referral to Nutrition and Diabetic E    Meds ordered this encounter  Medications  . atorvastatin (LIPITOR) 40 MG tablet    Sig: Take 1 tablet (40 mg total) by mouth daily.    Dispense:  90 tablet    Refill:  3  . metFORMIN (GLUCOPHAGE) 500 MG tablet    Sig: Take 1 tablet (500 mg total) by mouth 2 (two) times  daily with a meal.    Dispense:  180 tablet    Refill:  3  . Vitamin D, Ergocalciferol, (DRISDOL) 50000 units CAPS capsule    Sig: 1 capsule every Wednesday and Sunday    Dispense:  24 capsule    Refill:  3  . AMBULATORY NON FORMULARY MEDICATION    Sig: Single glucometer with lancets, test strips    Dispense:  1 each    Refill:  0  . blood glucose meter kit and supplies KIT    Sig: Dispense based on patient and insurance preference. Use in AM to test fasting and 2 hours after largest meal each day.    Dispense:  1 each    Refill:  0    Order Specific Question:   Number of strips    Answer:   100    Order Specific Question:   Number of lancets    Answer:   100  Pt was in the office today for 36+ minutes, with over 50% time spent in face to face counseling of patients various medical conditions, treatment plans of those medical conditions including medicine management and lifestyle modification, strategies to improve health and well being; and in coordination of care. SEE ABOVE FOR DETAILS  The patient was counseled, risk factors were discussed, anticipatory guidance given.  Gross side effects, risk and benefits, and alternatives of medications discussed with patient.  Patient is aware that all medications have potential side effects and we are unable to predict every side effect or drug-drug interaction that may occur.  Expresses verbal understanding and consents to current therapy plan and treatment regimen.  Return for Started Lipitor, metformin, 6-week lab only visit, 50-monthfollow-up with me.  Please see AVS handed out to patient at the end of our visit for further patient instructions/ counseling done pertaining to today's office visit.    Note: This document was prepared using Dragon voice recognition software and may include unintentional dictation errors.   This document serves as a record of services personally performed by DMellody Dance DO. It was created on her  behalf by KToni Amend a trained medical scribe. The creation of this record is based on the scribe's personal observations and the provider's statements to them.   I have reviewed the above medical documentation for accuracy and completeness and I concur.  DMellody Dance04/30/19 2:58 PM    Subjective:    HPI: Paula Vitugis a 47y.o. [facility].    Patient notes that over the past several months, she's lost several family members (deaths in the family) which has been stressful.  Her blood pressure has been excellent lately but she has not lost any weight.  Exercise Habits Patient currently walks every day, 3-4 mlies per day.  She keeps track of this in her phone. Notes that her minimum daily steps are 6000, 2.5 to 3 miles.  She's always going over that. Notes that she cannot lose weight - doesn't know if she needs to walk faster, incresae her heart rate, or what.  She does not track her diet daily.  Says she tries to eat more baked foods vs fried foods, and incorporates two vegetables with every meal.  "But I'm [place]."  Notes that she's sure she isn't preparing her meals as healthfully as possible.  Notes that she works nights and tries her best to eat at five before work. This is hard because she starts getting hungry again at 8 PM.  She currently sleeps from about 2 AM until 9 AM.  Diabetes (New Diagnosis) Patient is disappointed by her A1c (6.7) and new associated diagnosis of diabetes.  Vitamin D Continues taking 50,000 Vitamin D once weekly.  Has not been taking 5000 daily.  ENT - Recently Established with Specialist No worsening of her allergies.  Went to ENT yesterday. He changed her allergy medicine from Claritin to Zyrtec.  Discussed the pansinusitis with him. He approved of increased dose of Flovent.  Asthma & Allergies She was recently on a short course of  steroids. Notes that she feels better, with more "regular allergy stuff" now, sneezing and nasal drip.  Noted that the increase in Flovent was very helpful. Denies SOB or wheezing.  Is able to climb the steps fine, and has noticed a big difference.  Hasn't used her albuterol at all lately.  Continues taking  Singulair once nightly.  HTN:  -  Her blood pressure has been controlled at home.   At home, blood pressure runs 119-120 over 80's-70's.  - Patient reports good compliance with blood pressure medications  - Denies medication S-E   - Smoking Status noted   - She denies new onset of: chest pain, exercise intolerance, shortness of breath, dizziness, visual changes, headache, lower extremity swelling or claudication.   Last 3 blood pressure readings in our office are as follows: BP Readings from Last 3 Encounters:  10/12/17 128/84  09/08/17 (!) 142/92  07/14/17 136/87    Pulse Readings from Last 3 Encounters:  10/12/17 84  09/08/17 88  07/14/17 98    Filed Weights   10/12/17 1358  Weight: 195 lb (88.5 kg)      Patient Care Team    Relationship Specialty Notifications Start End  Mellody Dance, DO PCP - General Family Medicine  06/05/16      Lab Results  Component Value Date   CREATININE 0.90 10/05/2017   BUN 7 10/05/2017   NA 140 10/05/2017   K 3.8 10/05/2017   CL 102 10/05/2017   CO2 25 10/05/2017    Lab Results  Component Value Date   CHOL 189 10/05/2017   CHOL 200 (H) 12/17/2016   CHOL 194 06/10/2016    Lab Results  Component Value Date   HDL 54 10/05/2017   HDL 61 12/17/2016   HDL 61 06/10/2016    Lab Results  Component Value Date   LDLCALC 119 (H) 10/05/2017   LDLCALC 122 (H) 12/17/2016   LDLCALC 116 (H) 06/10/2016    Lab Results  Component Value Date   TRIG 79 10/05/2017   TRIG 85 12/17/2016   TRIG 86 06/10/2016    Lab Results  Component Value Date   CHOLHDL 3.5 10/05/2017   CHOLHDL 3.3 12/17/2016   CHOLHDL 3.2  06/10/2016    No results found for: LDLDIRECT ===================================================================   Patient Active Problem List   Diagnosis Date Noted  . Morbid obesity due to excess calories ([place]) 06/20/2016    Priority: High  . Vaccination not carried out because of patient refusal 06/20/2016    Priority: Medium  . Cough variant asthma 06/05/2016    Priority: Medium  . Sleep apnea 06/05/2016    Priority: Medium  . Heart murmur 06/05/2016    Priority: Medium  . GERD (gastroesophageal reflux disease) 06/05/2016    Priority: Medium  . Chronic pansinusitis 09/08/2017    Priority: Low  . Vitamin D deficiency 06/19/2016    Priority: Low  . Environmental and seasonal allergies 06/05/2016    Priority: Low  . Type 2 diabetes mellitus with complication, without long-term current use of insulin ([place]) 10/12/2017  . Hypertension associated with diabetes ([place]) 10/12/2017  . Mixed diabetic hyperlipidemia associated with type 2 diabetes mellitus ([place]) 10/12/2017  . Elevated TSH 10/12/2017  . ETD (Eustachian tube dysfunction), unspecified laterality 09/08/2017  . Eczema 07/14/2017  . Plantar fasciitis of right foot 11/24/2016  . Screening for breast cancer 09/24/2016  . Hypokalemia 09/15/2016  . Sinus tachycardia 09/15/2016  . Asthma exacerbation 09/14/2016  . Encounter for annual routine gynecological examination 07/09/2016     Past Medical History:  Diagnosis Date  . Asthma   . GERD (gastroesophageal reflux disease)   . Heart murmur    leaking valve  . Hypertension   . Sleep apnea      Past Surgical History:  Procedure Laterality Date  . BREAST SURGERY  reduction  . LIPOMA EXCISION    . TENDON RELEASE Right    elbow  . TONSILLECTOMY       Family History  Problem Relation Age of Onset  . Aneurysm Mother   . Stroke Father   . Hypertension Father   . Prostate cancer Father   . Diabetes Father   . Aneurysm Maternal Uncle   . Stroke Maternal  Grandmother   . Hypertension Maternal Grandmother   . Heart disease Maternal Grandmother   . Diabetes Maternal Grandmother   . Aneurysm Maternal Grandfather   . Stroke Paternal Grandmother      Social History   Substance and Sexual Activity  Drug Use No  ,  Social History   Substance and Sexual Activity  Alcohol Use Yes   Comment: occasionally  ,  Social History   Tobacco Use  Smoking Status Never Smoker  Smokeless Tobacco Never Used  ,    Current Outpatient Medications on File Prior to Visit  Medication Sig Dispense Refill  . cetirizine (ZYRTEC) 10 MG tablet Take 10 mg by mouth daily.    . cyclobenzaprine (FLEXERIL) 10 MG tablet Take 1 tablet (10 mg total) by mouth 3 (three) times daily as needed for muscle spasms. 30 tablet 0  . desoximetasone (TOPICORT) 0.25 % cream Apply 1 application topically 2 (two) times daily. 30 g 0  . fluticasone (FLOVENT HFA) 220 MCG/ACT inhaler 2 puffs twice daily 1 Inhaler 12  . glucose blood (ONE TOUCH ULTRA TEST) test strip USE ONE STRIP TO CHECK BLOOD SUGARS EVERY MORNING FASTING AND 2 HOURS AFTER LARGEST MEAL. 200 each 3  . ibuprofen (ADVIL,MOTRIN) 600 MG tablet Take 1 tablet (600 mg total) by mouth every 8 (eight) hours as needed. 30 tablet 0  . metoprolol succinate (TOPROL-XL) 50 MG 24 hr tablet TAKE 1 TABLET (50 MG TOTAL) BY MOUTH DAILY. TAKE WITH OR IMMEDIATELY FOLLOWING A MEAL. 30 tablet 0  . montelukast (SINGULAIR) 10 MG tablet TAKE 1 TABLET BY MOUTH EVERYDAY AT BEDTIME 90 tablet 1  . ONETOUCH DELICA LANCETS FINE MISC USE ONE LANCET TO CHECK BLOOD SUGARS EVERY MORNING FASTING AND 2 HOURS AFTER LARGEST MEAL. 100 each 4  . PROAIR HFA 108 (90 Base) MCG/ACT inhaler 1 PUFF INHALE EVERY 4-6 HOURS AS NEEDED (PRN) 8.5 Inhaler 0  . predniSONE (DELTASONE) 20 MG tablet Take 3 tabs po * 2 days, then 2 tabs for 2 d, then 1 tab 2 d, then 1/2 tab 2 days. 15 tablet 0   No current facility-administered medications on file prior to visit.       Allergies  Allergen Reactions  . Ultram [Tramadol Hcl] Other (See Comments)    syncope  . Amoxicillin Hives and Swelling  . Penicillins Hives and Swelling  . Sulfa Antibiotics Hives  . Peanut Oil Other (See Comments)    Unknown, from allergy testing  . Sesame Oil Hives  . Other Other (See Comments)    PEANUTS AND ALMONDS     Review of Systems:   General:  Denies fever, chills Optho/Auditory:   Denies visual changes, blurred vision Respiratory:   Denies SOB, cough, wheeze, DIB  Cardiovascular:   Denies chest pain, palpitations, painful respirations Gastrointestinal:   Denies nausea, vomiting, diarrhea.  Endocrine:     Denies new hot or cold intolerance Musculoskeletal:  Denies joint swelling, gait issues, or new unexplained myalgias/ arthralgias Skin:  Denies rash, suspicious lesions  Neurological:    Denies dizziness, unexplained weakness, numbness  Psychiatric/Behavioral:  Denies mood changes  Objective:    Blood pressure 128/84, pulse 84, height 5' 2" (1.575 m), weight 195 lb (88.5 kg), last menstrual period 09/21/2017, SpO2 98 %.  Body mass index is 35.67 kg/m.  General: Well Developed, well nourished, and in no acute distress.  HEENT: Normocephalic, atraumatic, pupils equal round reactive to light, neck supple, No carotid bruits, no JVD Skin: Warm and dry, cap RF less 2 sec Cardiac: Regular rate and rhythm, S1, S2 WNL's, no murmurs rubs or gallops Respiratory: ECTA B/L, Not using accessory muscles, speaking in full sentences. NeuroM-Sk: Ambulates w/o assistance, moves ext * 4 w/o difficulty, sensation grossly intact.  Ext: scant edema b/l lower ext Psych: No HI/SI, judgement and insight good, Euthymic mood. Full Affect.

## 2017-10-13 ENCOUNTER — Other Ambulatory Visit: Payer: Self-pay | Admitting: Family Medicine

## 2017-10-13 DIAGNOSIS — J301 Allergic rhinitis due to pollen: Secondary | ICD-10-CM | POA: Diagnosis not present

## 2017-10-13 DIAGNOSIS — J305 Allergic rhinitis due to food: Secondary | ICD-10-CM | POA: Diagnosis not present

## 2017-10-27 ENCOUNTER — Other Ambulatory Visit: Payer: Self-pay | Admitting: Family Medicine

## 2017-11-22 ENCOUNTER — Other Ambulatory Visit: Payer: 59

## 2017-11-22 DIAGNOSIS — E782 Mixed hyperlipidemia: Secondary | ICD-10-CM

## 2017-11-22 DIAGNOSIS — E118 Type 2 diabetes mellitus with unspecified complications: Secondary | ICD-10-CM | POA: Diagnosis not present

## 2017-11-22 DIAGNOSIS — R7989 Other specified abnormal findings of blood chemistry: Secondary | ICD-10-CM

## 2017-11-22 DIAGNOSIS — E1169 Type 2 diabetes mellitus with other specified complication: Secondary | ICD-10-CM

## 2017-11-22 DIAGNOSIS — I152 Hypertension secondary to endocrine disorders: Secondary | ICD-10-CM

## 2017-11-22 DIAGNOSIS — E1159 Type 2 diabetes mellitus with other circulatory complications: Secondary | ICD-10-CM

## 2017-11-22 DIAGNOSIS — I10 Essential (primary) hypertension: Secondary | ICD-10-CM | POA: Diagnosis not present

## 2017-11-23 LAB — COMPREHENSIVE METABOLIC PANEL
ALBUMIN: 3.9 g/dL (ref 3.5–5.5)
ALT: 14 IU/L (ref 0–32)
AST: 15 IU/L (ref 0–40)
Albumin/Globulin Ratio: 1.3 (ref 1.2–2.2)
Alkaline Phosphatase: 72 IU/L (ref 39–117)
BUN / CREAT RATIO: 8 — AB (ref 9–23)
BUN: 7 mg/dL (ref 6–24)
Bilirubin Total: 0.4 mg/dL (ref 0.0–1.2)
CALCIUM: 9.3 mg/dL (ref 8.7–10.2)
CO2: 23 mmol/L (ref 20–29)
CREATININE: 0.83 mg/dL (ref 0.57–1.00)
Chloride: 101 mmol/L (ref 96–106)
GFR, EST AFRICAN AMERICAN: 98 mL/min/{1.73_m2} (ref >59)
GFR, EST NON AFRICAN AMERICAN: 85 mL/min/{1.73_m2} (ref >59)
GLOBULIN, TOTAL: 2.9 g/dL (ref 1.5–4.5)
Glucose: 108 mg/dL — ABNORMAL HIGH (ref 65–99)
Potassium: 4.7 mmol/L (ref 3.5–5.2)
SODIUM: 140 mmol/L (ref 134–144)
Total Protein: 6.8 g/dL (ref 6.0–8.5)

## 2017-11-23 LAB — T3, FREE: T3, Free: 2.5 pg/mL (ref 2.0–4.4)

## 2017-11-23 LAB — T4, FREE: Free T4: 0.98 ng/dL (ref 0.82–1.77)

## 2017-11-23 LAB — TSH: TSH: 4.5 u[IU]/mL (ref 0.450–4.500)

## 2017-12-03 ENCOUNTER — Other Ambulatory Visit: Payer: Self-pay | Admitting: Family Medicine

## 2017-12-03 DIAGNOSIS — R7303 Prediabetes: Secondary | ICD-10-CM

## 2018-01-06 ENCOUNTER — Other Ambulatory Visit: Payer: Self-pay | Admitting: Family Medicine

## 2018-01-11 ENCOUNTER — Encounter: Payer: Self-pay | Admitting: Family Medicine

## 2018-01-11 ENCOUNTER — Ambulatory Visit (INDEPENDENT_AMBULATORY_CARE_PROVIDER_SITE_OTHER): Payer: 59 | Admitting: Family Medicine

## 2018-01-11 VITALS — BP 111/79 | HR 72 | Ht 62.0 in | Wt 193.4 lb

## 2018-01-11 DIAGNOSIS — E782 Mixed hyperlipidemia: Secondary | ICD-10-CM

## 2018-01-11 DIAGNOSIS — I10 Essential (primary) hypertension: Secondary | ICD-10-CM

## 2018-01-11 DIAGNOSIS — E118 Type 2 diabetes mellitus with unspecified complications: Secondary | ICD-10-CM | POA: Diagnosis not present

## 2018-01-11 DIAGNOSIS — E1169 Type 2 diabetes mellitus with other specified complication: Secondary | ICD-10-CM | POA: Diagnosis not present

## 2018-01-11 DIAGNOSIS — R7989 Other specified abnormal findings of blood chemistry: Secondary | ICD-10-CM

## 2018-01-11 DIAGNOSIS — E1159 Type 2 diabetes mellitus with other circulatory complications: Secondary | ICD-10-CM | POA: Diagnosis not present

## 2018-01-11 LAB — POCT GLYCOSYLATED HEMOGLOBIN (HGB A1C): Hemoglobin A1C: 6.4 % — AB (ref 4.0–5.6)

## 2018-01-11 MED ORDER — METFORMIN HCL 500 MG PO TABS: 1000.0000 mg | ORAL_TABLET | Freq: Two times a day (BID) | ORAL | 3 refills | Status: DC

## 2018-01-11 NOTE — Progress Notes (Signed)
Impression and Recommendations:    1. Type 2 diabetes mellitus with complication, without long-term current use of insulin ([place])   2. Hypertension associated with diabetes ([place])   3. Mixed diabetic hyperlipidemia associated with type 2 diabetes mellitus ([place])   4. Morbid obesity ([place])   5. h/o Elevated TSH     Last appointment, patient started on Metformin and Lipitor.  1.  Diabetes Mellitus - A1c under better control, decreased to 6.4 to 6.7. - Reviewed goal A1c as under 6.5. - Increase dose of metformin to 2 tablets morning and 2 tablets nightly. See med list today. - Patient tolerating metformin well without complication.  Denies S-E. - If patient cannot tolerate increased dose, she knows other options are available. - Pt will let us know if she desires to try an alternative to this. - Patient desires to lose weight, so she is willing to try lifestyle modifications in addition.  - Risks and benefits of medications discussed and reviewed with patient, including alternative treatments.     - Counseled patient on pathophysiology of disease and discussed various treatment options, which often includes dietary and lifestyle modifications as first line.  Importance of low carb/ketogenic diet discussed with patient in addition to regular exercise.   - Continue checking fasting blood sugar in the morning, and 2 hours postprandial, after the biggest meal of the day.  Keep log and bring in next OV for my review.   Also, if you ever feel poorly, please check your blood pressure and blood sugar, as one or the other could be the cause of your symptoms. - Reviewed that while on the metformin, her blood sugar should regularly be under 120.  - Being a diabetic, you need yearly eye and foot exams. Make appt. for diabetic eye exam.   2.  Hypertension - Patient managed on metoprolol QD.   - BP within goal at this time, under 125/80. - Advised that with diabetes diagnosis, blood pressure  should be consistently controlled.  - Pt tolerates meds well.  Continue as prescribed.  See med list below. - Continue working on diet, exercise, and weight loss.  3.  Environmental & Seasonal Allergies, ETD, Chronic Pansinusitis - Continue Flovent as prescribed.  See med list below. - Patient tolerates increased dose well and notes improvement.   - Continue Singulair and Zyrtec as prescribed. - Continue using your albuterol PRN prior to exercise.  Per pt, uses rarely.  4.  Vitamin D - Continue Vitamin D3 prescription twice weekly - on Wednesday and Sunday. - Will continue to monitor and re-check PRN with other labs.  5.  Hyperlipidemia - Continue Lipitor nightly at bedtime.  See med list below. - Risks and benefits of medication reviewed with patient today. - Emphasized critical importance of continued adequate hydration while on this medicine.  - HDL last check at 54, down from prior value of 61. - Patient knows to exercise to improve this value.  - LDL last check at 119.  Reviewed with patient that, if diabetic, goal LDL is less than 100.  6. H/o Elevated TSH - History of elevated TSH - normalized at recent re-check. - Will continue to monitor.  7. General Health Maintenance - Morbid Obesity - BMI of 35.37 kg/m.  Explained to patient what BMI refers to, and what it means medically.    Told patient to think about it as a "medical risk stratification measurement" and how increasing BMI is associated with increasing risk/ or worsening  state of various diseases such as hypertension, hyperlipidemia, diabetes, premature OA, depression etc.  American Heart Association guidelines for healthy diet, basically Mediterranean diet, and exercise guidelines of 30 minutes 5 days per week or more discussed in detail.  Health counseling performed.  All questions answered.  Tracking Heart Rate & Exercise - Continue using heart monitor to track heart rate. - Continue exercising in  120 to 140 HR range for 30 minutes minimum daily, especially if weight loss is desired, as 70%-80% of max heart rate is fat burning.  - Advised patient to continue exercising to improve health.  Recommended that the patient eventually strive for at least 150 minutes of cardio per week according to the [facility].   Dietary Habits - Healthy dietary habits encouraged, including low-carb, and high amounts of lean protein in diet.  - Advised patient to not eat or drink within 3 hours of lying down for bed at night.  - Patient should also consume adequate amounts of water - half of body weight in oz of water per day.  8. Follow-Up - Refills provided as needed. - Return in 3-4 months for follow-up DM OV. - Patient knows to let us know if she cannot tolerate her upped dose of metformin.  - Continue following up for regular health maintenance visits, and re-check of A1c/kidneys as scheduled.  - She knows to call if she has any concerns prior to next scheduled follow-up.   Education and routine counseling performed. Handouts provided.  Orders Placed This Encounter  Procedures  . Hemoglobin A1c  . Lipid Panel w/reflex Direct LDL  . Comprehensive metabolic panel  . Microalbumin / creatinine urine ratio  . POCT glycosylated hemoglobin (Hb A1C)  . HM Diabetes Foot Exam    Meds ordered this encounter  Medications  . metFORMIN (GLUCOPHAGE) 500 MG tablet    Sig: Take 2 tablets (1,000 mg total) by mouth 2 (two) times daily with a meal.    Dispense:  360 tablet    Refill:  3    Return for Chronic OV w me 43mo& FBW 2-3d prior.   The patient was counseled, risk factors were discussed, anticipatory guidance given.  Gross side effects, risk and benefits, and alternatives of medications discussed with patient.  Patient is aware that all medications have potential side effects and we are unable to predict every side effect or drug-drug interaction that may occur.  Expresses verbal understanding  and consents to current therapy plan and treatment regimen.  Please see AVS handed out to patient at the end of our visit for further patient instructions/ counseling done pertaining to today's office visit.    Note: This document was prepared using Dragon voice recognition software and may include unintentional dictation errors.  This document serves as a record of services personally performed by DMellody Dance DO. It was created on her behalf by KToni Amend a trained medical scribe. The creation of this record is based on the scribe's personal observations and the provider's statements to them.   I have reviewed the above medical documentation for accuracy and completeness and I concur.  DMellody Dance07/28/19 9:56 PM     Subjective:    Chief Complaint  Patient presents with  . Diabetes  . Hyperlipidemia    Paula Breeseis a 47y.o. female [facility]    Last time, patient started on Metformin and Lipitor.  She is up to a  full tablet of both medications, as prescribed. Patient states she feels fine; "I think I feel fine."  Notes she went on a 7 day cruise, but she was "swollen" the whole time.  Notes that she ate and was swollen "every day."  After she was finished with the cruise, the swelling went down - the very next day.  Environmental & Seasonal Allergies, ETD, Chronic Pansinusitis Patient continues taking Singulair once per night. Notes she never really uses the ProAir; has only used it once or twice.  DM HPI: -  She has been working on diet and exercise for diabetes. Patient is still walking every day; notes that's all the formal exercise she does. Walks at least 30 minutes or more daily.  She gets in between 6k-10k steps per day.  Pt is currently maintained on the following medications for diabetes:   see med list today Medication compliance - Patient has been taking all  medications as prescribed.  Home glucose readings range:  Notes that fasting sugars are running around 105. States that postprandial sugars run around 120 to 150-160.   Denies polyuria/polydipsia. Denies hypo/ hyperglycemia symptoms - She denies new onset of: chest pain, exercise intolerance, shortness of breath, dizziness, visual changes, headache, lower extremity swelling or claudication.   Last diabetic eye exam was No results found for: HMDIABEYEEXA  Foot exam- UTD  Last A1C in the office was:  Lab Results  Component Value Date   HGBA1C 6.4 (A) 01/11/2018   HGBA1C 6.7 (H) 10/05/2017   HGBA1C 6.0 07/14/2017    Lab Results  Component Value Date   LDLCALC 119 (H) 10/05/2017   CREATININE 0.83 11/22/2017   1. HTN HPI:  -  Her blood pressure has been controlled at home.  Pt often checks it at home.  Running around 120/70's.  Patient notes that if she has a bad headache, her blood pressure is usually high.  - Patient reports good compliance with blood pressure medications.  - Denies medication S-E   - Smoking Status noted   - She denies new onset of: chest pain, exercise intolerance, shortness of breath, dizziness, visual changes, headache, lower extremity swelling or claudication.   Last 3 blood pressure readings in our office are as follows: BP Readings from Last 3 Encounters:  01/11/18 111/79  10/12/17 128/84  09/08/17 (!) 142/92    Filed Weights   01/11/18 1404  Weight: 193 lb 6.4 oz (87.7 kg)    2. 47 y.o. female here for cholesterol follow-up.   - Patient reports good compliance with medications or treatment plan  - Denies medication S-E   - Smoking Status noted   - She denies new onset of: chest pain, exercise intolerance, shortness of breath, dizziness, visual changes, headache, lower extremity swelling or claudication.   Denies myalgias.  The cholesterol last visit was:  Lab Results  Component Value Date   CHOL 189 10/05/2017   HDL 54  10/05/2017   LDLCALC 119 (H) 10/05/2017   TRIG 79 10/05/2017   CHOLHDL 3.5 10/05/2017    Hepatic Function Latest Ref Rng & Units 11/22/2017 10/05/2017 06/10/2016  Total Protein 6.0 - 8.5 g/dL 6.8 6.8 7.1  Albumin 3.5 - 5.5 g/dL 3.9 4.1 4.1  AST 0 - 40 IU/L _0 ALT 0 - 32 IU/L _1 Alk Phosphatase 39 - 117 IU/L 72 66 59  Total Bilirubin 0.0 - 1.2 mg/dL 0.4 0.5 1.0     Last 3 blood pressure readings in  our office are as follows: BP Readings from Last 3 Encounters:  01/11/18 111/79  10/12/17 128/84  09/08/17 (!) 142/92    BMI Readings from Last 3 Encounters:  01/11/18 35.37 kg/m  10/12/17 35.67 kg/m  09/08/17 36.06 kg/m     No problems updated.    Patient Care Team    Relationship Specialty Notifications Start End  Mellody Dance, DO PCP - General Family Medicine  06/05/16      Patient Active Problem List   Diagnosis Date Noted  . Morbid obesity ([place]) 06/20/2016    Priority: High  . Vaccination not carried out because of patient refusal 06/20/2016    Priority: Medium  . Cough variant asthma 06/05/2016    Priority: Medium  . Sleep apnea 06/05/2016    Priority: Medium  . Heart murmur 06/05/2016    Priority: Medium  . GERD (gastroesophageal reflux disease) 06/05/2016    Priority: Medium  . Chronic pansinusitis 09/08/2017    Priority: Low  . Vitamin D deficiency 06/19/2016    Priority: Low  . Environmental and seasonal allergies 06/05/2016    Priority: Low  . Type 2 diabetes mellitus with complication, without long-term current use of insulin ([place]) 10/12/2017  . Hypertension associated with diabetes ([place]) 10/12/2017  . Mixed diabetic hyperlipidemia associated with type 2 diabetes mellitus ([place]) 10/12/2017  . Elevated TSH 10/12/2017  . ETD (Eustachian tube dysfunction), unspecified laterality 09/08/2017  . Eczema 07/14/2017  . Plantar fasciitis of right foot 11/24/2016  . Screening for breast cancer 09/24/2016  . Hypokalemia 09/15/2016  .  Sinus tachycardia 09/15/2016  . Asthma exacerbation 09/14/2016  . Encounter for annual routine gynecological examination 07/09/2016     Past Medical History:  Diagnosis Date  . Asthma   . GERD (gastroesophageal reflux disease)   . Heart murmur    leaking valve  . Hypertension   . Sleep apnea      Past Surgical History:  Procedure Laterality Date  . BREAST SURGERY     reduction  . LIPOMA EXCISION    . TENDON RELEASE Right    elbow  . TONSILLECTOMY       Family History  Problem Relation Age of Onset  . Aneurysm Mother   . Stroke Father   . Hypertension Father   . Prostate cancer Father   . Diabetes Father   . Aneurysm Maternal Uncle   . Stroke Maternal Grandmother   . Hypertension Maternal Grandmother   . Heart disease Maternal Grandmother   . Diabetes Maternal Grandmother   . Aneurysm Maternal Grandfather   . Stroke Paternal Grandmother      Social History   Substance and Sexual Activity  Drug Use No  ,  Social History   Substance and Sexual Activity  Alcohol Use Yes   Comment: occasionally  ,  Social History   Tobacco Use  Smoking Status Never Smoker  Smokeless Tobacco Never Used  ,    Current Outpatient Medications on File Prior to Visit  Medication Sig Dispense Refill  . ACCU-CHEK GUIDE test strip TEST FASTING LEVEL IN MORNING AND 2 HOURS AFTER LARGEST MEAL EACH DAY 100 each 1  . AMBULATORY NON FORMULARY MEDICATION Single glucometer with lancets, test strips 1 each 0  . atorvastatin (LIPITOR) 40 MG tablet Take 1 tablet (40 mg total) by mouth daily. 90 tablet 3  . blood glucose meter kit and supplies KIT Dispense based on patient and insurance preference. Use in AM to test fasting and 2 hours after largest  meal each day. 1 each 0  . cetirizine (ZYRTEC) 10 MG tablet Take 10 mg by mouth daily.    Marland Kitchen desoximetasone (TOPICORT) 0.25 % cream Apply 1 application topically 2 (two) times daily. 30 g 0  . fluticasone (FLOVENT HFA) 220 MCG/ACT inhaler 2  puffs twice daily 1 Inhaler 12  . ibuprofen (ADVIL,MOTRIN) 600 MG tablet Take 1 tablet (600 mg total) by mouth every 8 (eight) hours as needed. 30 tablet 0  . losartan-hydrochlorothiazide (HYZAAR) 50-12.5 MG tablet TAKE 1 TABLET BY MOUTH EVERY DAY 90 tablet 0  . metoprolol succinate (TOPROL-XL) 50 MG 24 hr tablet TAKE 1 TABLET (50 MG TOTAL) BY MOUTH DAILY. TAKE WITH OR IMMEDIATELY FOLLOWING A MEAL. 90 tablet 1  . montelukast (SINGULAIR) 10 MG tablet TAKE 1 TABLET BY MOUTH EVERYDAY AT BEDTIME 90 tablet 1  . ONETOUCH DELICA LANCETS FINE MISC USE ONE LANCET TO CHECK BLOOD SUGARS EVERY MORNING FASTING AND 2 HOURS AFTER LARGEST MEAL. 100 each 4  . PROAIR HFA 108 (90 Base) MCG/ACT inhaler 1 PUFF INHALE EVERY 4-6 HOURS AS NEEDED (PRN) 8.5 Inhaler 0  . Vitamin D, Ergocalciferol, (DRISDOL) 50000 units CAPS capsule TAKE ONE CAPSULE BY MOUTH ONE TIME PER WEEK 30 capsule 0   No current facility-administered medications on file prior to visit.      Allergies  Allergen Reactions  . Ultram [Tramadol Hcl] Other (See Comments)    syncope  . Amoxicillin Hives and Swelling  . Penicillins Hives and Swelling  . Sulfa Antibiotics Hives  . Peanut Oil Other (See Comments)    Unknown, from allergy testing  . Sesame Oil Hives  . Other Other (See Comments)    PEANUTS AND ALMONDS     Review of Systems:   General:  Denies fever, chills Optho/Auditory:   Denies visual changes, blurred vision Respiratory:   Denies SOB, cough, wheeze, DIB  Cardiovascular:   Denies chest pain, palpitations, painful respirations Gastrointestinal:   Denies nausea, vomiting, diarrhea.  Endocrine:     Denies new hot or cold intolerance Musculoskeletal:  Denies joint swelling, gait issues, or new unexplained myalgias/ arthralgias Skin:  Denies rash, suspicious lesions  Neurological:    Denies dizziness, unexplained weakness, numbness  Psychiatric/Behavioral:   Denies mood changes    Objective:     Blood pressure 111/79, pulse  72, height _0  (1.575 m), weight 193 lb 6.4 oz (87.7 kg), last menstrual period 12/28/2017, SpO2 99 %.  Body mass index is 35.37 kg/m.  General: Well Developed, well nourished, and in no acute distress.  HEENT: Normocephalic, atraumatic, pupils equal round reactive to light, neck supple, No carotid bruits, no JVD Skin: Warm and dry, cap RF less 2 sec Cardiac: Regular rate and rhythm, S1, S2 WNL's, no murmurs rubs or gallops Respiratory: ECTA B/L, Not using accessory muscles, speaking in full sentences. NeuroM-Sk: Ambulates w/o assistance, moves ext * 4 w/o difficulty, sensation grossly intact.  Ext: scant edema b/l lower ext Psych: No HI/SI, judgement and insight good, Euthymic mood. Full Affect.

## 2018-01-11 NOTE — Patient Instructions (Signed)

## 2018-04-11 ENCOUNTER — Other Ambulatory Visit: Payer: 59

## 2018-04-11 DIAGNOSIS — E782 Mixed hyperlipidemia: Secondary | ICD-10-CM

## 2018-04-11 DIAGNOSIS — I10 Essential (primary) hypertension: Secondary | ICD-10-CM

## 2018-04-11 DIAGNOSIS — E1169 Type 2 diabetes mellitus with other specified complication: Secondary | ICD-10-CM

## 2018-04-11 DIAGNOSIS — E1159 Type 2 diabetes mellitus with other circulatory complications: Secondary | ICD-10-CM

## 2018-04-11 DIAGNOSIS — E118 Type 2 diabetes mellitus with unspecified complications: Secondary | ICD-10-CM | POA: Diagnosis not present

## 2018-04-11 DIAGNOSIS — I152 Hypertension secondary to endocrine disorders: Secondary | ICD-10-CM

## 2018-04-12 LAB — COMPREHENSIVE METABOLIC PANEL
ALT: 15 IU/L (ref 0–32)
AST: 18 IU/L (ref 0–40)
Albumin/Globulin Ratio: 1.5 (ref 1.2–2.2)
Albumin: 4.4 g/dL (ref 3.5–5.5)
Alkaline Phosphatase: 77 IU/L (ref 39–117)
BILIRUBIN TOTAL: 0.5 mg/dL (ref 0.0–1.2)
BUN/Creatinine Ratio: 12 (ref 9–23)
BUN: 11 mg/dL (ref 6–24)
CALCIUM: 9.2 mg/dL (ref 8.7–10.2)
CHLORIDE: 102 mmol/L (ref 96–106)
CO2: 26 mmol/L (ref 20–29)
Creatinine, Ser: 0.94 mg/dL (ref 0.57–1.00)
GFR calc non Af Amer: 73 mL/min/{1.73_m2} (ref >59)
GFR, EST AFRICAN AMERICAN: 84 mL/min/{1.73_m2} (ref >59)
GLUCOSE: 94 mg/dL (ref 65–99)
Globulin, Total: 2.9 g/dL (ref 1.5–4.5)
Potassium: 4 mmol/L (ref 3.5–5.2)
Sodium: 142 mmol/L (ref 134–144)
TOTAL PROTEIN: 7.3 g/dL (ref 6.0–8.5)

## 2018-04-12 LAB — HEMOGLOBIN A1C
Est. average glucose Bld gHb Est-mCnc: 126 mg/dL
Hgb A1c MFr Bld: 6 % — ABNORMAL HIGH (ref 4.8–5.6)

## 2018-04-12 LAB — MICROALBUMIN / CREATININE URINE RATIO
Creatinine, Urine: 106 mg/dL
Microalbumin, Urine: <3 ug/mL

## 2018-04-13 ENCOUNTER — Encounter: Payer: Self-pay | Admitting: Family Medicine

## 2018-04-13 ENCOUNTER — Ambulatory Visit (INDEPENDENT_AMBULATORY_CARE_PROVIDER_SITE_OTHER): Payer: 59 | Admitting: Family Medicine

## 2018-04-13 VITALS — BP 125/83 | HR 73 | Ht 62.0 in | Wt 184.0 lb

## 2018-04-13 DIAGNOSIS — I10 Essential (primary) hypertension: Secondary | ICD-10-CM

## 2018-04-13 DIAGNOSIS — E118 Type 2 diabetes mellitus with unspecified complications: Secondary | ICD-10-CM | POA: Diagnosis not present

## 2018-04-13 DIAGNOSIS — E1169 Type 2 diabetes mellitus with other specified complication: Secondary | ICD-10-CM

## 2018-04-13 DIAGNOSIS — E1159 Type 2 diabetes mellitus with other circulatory complications: Secondary | ICD-10-CM | POA: Diagnosis not present

## 2018-04-13 DIAGNOSIS — E669 Obesity, unspecified: Secondary | ICD-10-CM | POA: Diagnosis not present

## 2018-04-13 DIAGNOSIS — M79671 Pain in right foot: Secondary | ICD-10-CM

## 2018-04-13 DIAGNOSIS — E782 Mixed hyperlipidemia: Secondary | ICD-10-CM

## 2018-04-13 MED ORDER — METFORMIN HCL 500 MG PO TABS: 500.0000 mg | ORAL_TABLET | Freq: Two times a day (BID) | ORAL | 0 refills | Status: DC

## 2018-04-13 MED ORDER — METOPROLOL SUCCINATE ER 25 MG PO TB24: 25.0000 mg | ORAL_TABLET | Freq: Every day | ORAL | 0 refills | Status: DC

## 2018-04-13 NOTE — Progress Notes (Signed)
Impression and Recommendations:    1. Type 2 diabetes mellitus with complication, without long-term current use of insulin ([place])   2. Hypertension associated with diabetes ([place])   3. Mixed diabetic hyperlipidemia associated with type 2 diabetes mellitus ([name])   4. Obesity (BMI 30-39.9)   5. Right foot pain      DM -Decreased metforming to '500mg'$  BID -Explained that metformin helps increase our body's sensitivity to blood sugar educated pt that when our blood sugar drops far below our normal, we will have symptoms even if it is near a normally  healthy range -Educated pt that meal replacement shakes and low calorie diets are the best way to lose weight  -Discussed methods for helping to cut calories such as replacing carbs with cauliflower and making smoothies at home -Educated that with familial background of diabetes that it is important to stick to healthy lifestyle changes in order to avoid disease progression -Discussed blood glucose results and what they indicate -Explained that although our pancrease doesn't rejuvenate, DM can be completely controlled by diet   HTN -Decreased Toprolol -Explained that losartan is helping to protect her kidneys due to DM  -Explained that if she does see an increase in symptoms of low bp, we will adjust the metoprolol -Discussed options for losartan such as taking a pill every other day or switching to the 12 hour medication -explained that typically pts shouldn't cut XL medications because they don't release the same way Educated pt about symptoms of low bp incuding feeling fatigue, sluggishness, and dizziness -Explained that pt may see a short term increase in bp as her body adjusts -Explained that   Foot pain -Encouraged pt to continue doing her hip exercises to help realign her hips -Explained that because the tingling is only in 1 foot and the bottom of the foot, it could be a nerve issue -Explained that pain could also be due to pt's  flat arches -Informed pt that the best way to identify issues is to go to podiatry for evaluation -encouraged pt to wear her orthotics in every pair of shoes especially when walking -Educated pt about how the body tends to revert when we stop following through on physical therapy exercises  -Encouraged pt to contact us if symptoms do not improve after a few weeks  Lab Results -Discussed electrolytes, ser/creatinine, and GFR results as well as their indication of kidney health -Discussed ALT and AST and their indications of liver health  Exercise -Educated pt about the positive impact of exercise on the mind and overall health -Encouraged pt to continue walking and making positive changes in her diet as well  Follow up -Pt to return in 2-3 months to recheck A1C and medication affects   Education and routine counseling performed. Handouts provided.  Medications Discontinued During This Encounter  Medication Reason  . metFORMIN (GLUCOPHAGE) 500 MG tablet Reorder  . metoprolol succinate (TOPROL-XL) 50 MG 24 hr tablet       The patient was counseled, risk factors were discussed, anticipatory guidance given.  Gross side effects, risk and benefits, and alternatives of medications discussed with patient.  Patient is aware that all medications have potential side effects and we are unable to predict every side effect or drug-drug interaction that may occur.  Expresses verbal understanding and consents to current therapy plan and treatment regimen.   Return for DM, HTN, HLD follow up every 45mo   Please see AVS handed out to patient at the end  of our visit for further patient instructions/ counseling done pertaining to today's office visit.    Note:  This document was prepared using Dragon voice recognition software and may include unintentional dictation errors.  This document serves as a record of services personally performed by Mellody Dance, MD. It was created on her behalf by  Georga Bora, a trained medical scribe. The creation of this record is based on the scribe's personal observations and the provider's statements to them.   I have reviewed the above medical documentation for accuracy and completeness and I concur.  Mellody Dance, D.O.        Subjective:    Chief Complaint  Patient presents with  . Follow-up     Paula Lambert is a 47 y.o. female who presents to [place] at [facility] today for Diabetes Management.     DM HPI: -A1C 6.0 in OV -  She has een working on diet and exercise for diabetes -Pt is currently maintained on metformin, she is taking it as directed  -her lowest fasting sugars were in the 70s and she states she has had some symptoms of low blood sugar also -Pt increased her walking time from 1 to 3 miles, usually doing around 60 - 80 min -Pt states she has cut back her portions and the medication has reduced her appetite as well -Says she replaces some of her meals with green and veggie smoothies  Denies polyuria/polydipsia. Denies hypo/ hyperglycemia symptoms - She denies new onset of: chest pain, exercise intolerance, shortness of breath, dizziness, visual changes, headache, lower extremity swelling or claudication.   Last diabetic eye exam was No results found for: [id number]  Foot exam- UTD  Last A1C in the office was:  Lab Results  Component Value Date   HGBA1C 6.0 (H) 04/11/2018   HGBA1C 6.4 (A) 01/11/2018   HGBA1C 6.7 (H) 10/05/2017    Lab Results  Component Value Date   LDLCALC 119 (H) 10/05/2017   CREATININE 0.94 04/11/2018    Foot tingling -Pt states she has been experiencing tingling on the bottom of her food "all the time" -Says it feels weird even when it's touched and is still tingling -Says she told her doctor that she "felt something pop in my leg last time I was there" when she was in an appointment -Says its happening all the time and it is really bothering her -Only  in one foot and denies back pain -Says she thinks it might be a nerve because her hips were out of alignment and she had to go to physical therapy to help with alignment of her hips  HTN HPI: -  Her blood pressure has been controlled at home.  Pt is checking it at home.  -States her bp is usually around 117/80s and said at the lowest, it has been around 29 -States she has had some low blood pressure readings and that she has noticed symptoms when she has low bp - Patient reports good compliance with blood pressure medications  - Denies medication S-E   - Smoking Status noted   - She denies new onset of: chest pain, exercise intolerance, shortness of breath, dizziness, visual changes, headache, lower extremity swelling or claudication  Last 3 blood pressure readings in our office are as follows: BP Readings from Last 3 Encounters:  04/13/18 125/83  01/11/18 111/79  10/12/17 128/84    Filed Weights   04/13/18 1359  Weight: 184 lb (83.5 kg)  Hepatic Function Latest Ref Rng & Units 04/11/2018 11/22/2017 10/05/2017  Total Protein 6.0 - 8.5 g/dL 7.3 6.8 6.8  Albumin 3.5 - 5.5 g/dL 4.4 3.9 4.1  AST 0 - 40 IU/L '18 15 16  '$ ALT 0 - 32 IU/L '15 14 14  '$ Alk Phosphatase 39 - 117 IU/L 77 72 66  Total Bilirubin 0.0 - 1.2 mg/dL 0.5 0.4 0.5      BMI Readings from Last 3 Encounters:  04/13/18 33.65 kg/m  01/11/18 35.37 kg/m  10/12/17 35.67 kg/m     No problems updated.    Patient Care Team    Relationship Specialty Notifications Start End  Mellody Dance, DO PCP - General Family Medicine  06/05/16      Patient Active Problem List   Diagnosis Date Noted  . Morbid obesity ([place]) 06/20/2016    Priority: High  . Vaccination not carried out because of patient refusal 06/20/2016    Priority: Medium  . Cough variant asthma 06/05/2016    Priority: Medium  . Sleep apnea 06/05/2016    Priority: Medium  . Heart murmur 06/05/2016    Priority: Medium  . GERD (gastroesophageal  reflux disease) 06/05/2016    Priority: Medium  . Chronic pansinusitis 09/08/2017    Priority: Low  . Vitamin D deficiency 06/19/2016    Priority: Low  . Environmental and seasonal allergies 06/05/2016    Priority: Low  . Obesity (BMI 30-39.9) 04/13/2018  . Right foot pain 04/13/2018  . Type 2 diabetes mellitus with complication, without long-term current use of insulin ([place]) 10/12/2017  . Hypertension associated with diabetes ([place]) 10/12/2017  . Mixed diabetic hyperlipidemia associated with type 2 diabetes mellitus ([place]) 10/12/2017  . Elevated TSH 10/12/2017  . ETD (Eustachian tube dysfunction), unspecified laterality 09/08/2017  . Eczema 07/14/2017  . Plantar fasciitis of right foot 11/24/2016  . Screening for breast cancer 09/24/2016  . Hypokalemia 09/15/2016  . Sinus tachycardia 09/15/2016  . Asthma exacerbation 09/14/2016  . Encounter for annual routine gynecological examination 07/09/2016     Past Medical History:  Diagnosis Date  . Asthma   . GERD (gastroesophageal reflux disease)   . Heart murmur    leaking valve  . Hypertension   . Sleep apnea      Past Surgical History:  Procedure Laterality Date  . BREAST SURGERY     reduction  . LIPOMA EXCISION    . TENDON RELEASE Right    elbow  . TONSILLECTOMY       Family History  Problem Relation Age of Onset  . Aneurysm Mother   . Stroke Father   . Hypertension Father   . Prostate cancer Father   . Diabetes Father   . Aneurysm Maternal Uncle   . Stroke Maternal Grandmother   . Hypertension Maternal Grandmother   . Heart disease Maternal Grandmother   . Diabetes Maternal Grandmother   . Aneurysm Maternal Grandfather   . Stroke Paternal Grandmother      Social History   Substance and Sexual Activity  Drug Use No  ,  Social History   Substance and Sexual Activity  Alcohol Use Yes   Comment: occasionally  ,  Social History   Tobacco Use  Smoking Status Never Smoker  Smokeless Tobacco Never  Used  ,    Current Outpatient Medications on File Prior to Visit  Medication Sig Dispense Refill  . ACCU-CHEK GUIDE test strip TEST FASTING LEVEL IN MORNING AND 2 HOURS AFTER LARGEST MEAL EACH DAY 100 each 1  .  AMBULATORY NON FORMULARY MEDICATION Single glucometer with lancets, test strips 1 each 0  . atorvastatin (LIPITOR) 40 MG tablet Take 1 tablet (40 mg total) by mouth daily. 90 tablet 3  . blood glucose meter kit and supplies KIT Dispense based on patient and insurance preference. Use in AM to test fasting and 2 hours after largest meal each day. 1 each 0  . cetirizine (ZYRTEC) 10 MG tablet Take 10 mg by mouth daily.    Marland Kitchen desoximetasone (TOPICORT) 0.25 % cream Apply 1 application topically 2 (two) times daily. 30 g 0  . fluticasone (FLOVENT HFA) 220 MCG/ACT inhaler 2 puffs twice daily 1 Inhaler 12  . ibuprofen (ADVIL,MOTRIN) 600 MG tablet Take 1 tablet (600 mg total) by mouth every 8 (eight) hours as needed. 30 tablet 0  . losartan-hydrochlorothiazide (HYZAAR) 50-12.5 MG tablet TAKE 1 TABLET BY MOUTH EVERY DAY 90 tablet 0  . montelukast (SINGULAIR) 10 MG tablet TAKE 1 TABLET BY MOUTH EVERYDAY AT BEDTIME 90 tablet 1  . ONETOUCH DELICA LANCETS FINE MISC USE ONE LANCET TO CHECK BLOOD SUGARS EVERY MORNING FASTING AND 2 HOURS AFTER LARGEST MEAL. 100 each 4  . PROAIR HFA 108 (90 Base) MCG/ACT inhaler 1 PUFF INHALE EVERY 4-6 HOURS AS NEEDED (PRN) 8.5 Inhaler 0  . Vitamin D, Ergocalciferol, (DRISDOL) 50000 units CAPS capsule TAKE ONE CAPSULE BY MOUTH ONE TIME PER WEEK 30 capsule 0   No current facility-administered medications on file prior to visit.      Allergies  Allergen Reactions  . Ultram [Tramadol Hcl] Other (See Comments)    syncope  . Amoxicillin Hives and Swelling  . Penicillins Hives and Swelling  . Sulfa Antibiotics Hives  . Peanut Oil Other (See Comments)    Unknown, from allergy testing  . Sesame Oil Hives  . Other Other (See Comments)    PEANUTS AND ALMONDS      Review of Systems:   General:  Denies fever, chills Optho/Auditory:   Denies visual changes, blurred vision Respiratory:   Denies SOB, cough, wheeze, DIB  Cardiovascular:   Denies chest pain, palpitations, painful respirations Gastrointestinal:   Denies nausea, vomiting, diarrhea.  Endocrine:     Denies new hot or cold intolerance Musculoskeletal:  Denies joint swelling, gait issues, or new unexplained myalgias/ arthralgias Skin:  Denies rash, suspicious lesions  Neurological:    Denies dizziness, unexplained weakness, numbness  Psychiatric/Behavioral:   Denies mood changes    Objective:     Blood pressure 125/83, pulse 73, height '5\' 2"'$  (1.575 m), weight 184 lb (83.5 kg), SpO2 100 %.  Body mass index is 33.65 kg/m.  General: Well Developed, well nourished, and in no acute distress.  HEENT: Normocephalic, atraumatic, pupils equal round reactive to light, neck supple, No carotid bruits, no JVD Skin: Warm and dry, cap RF less 2 sec Cardiac: Regular rate and rhythm, S1, S2 WNL's, no murmurs rubs or gallops Respiratory: ECTA B/L, Not using accessory muscles, speaking in full sentences. NeuroM-Sk: Ambulates w/o assistance, moves ext * 4 w/o difficulty, sensation grossly intact.  Ext: scant edema b/l lower ext Psych: No HI/SI, judgement and insight good, Euthymic mood. Full Affect.

## 2018-04-13 NOTE — Patient Instructions (Signed)
-  Make sure you are doing your exercises a physical therapy told you do.  Also wear your orthotics every day and especially as much as possible with exercise and moving more. If you do both of these things religiously and in a couple of months you are still having problems we need to consider doing an MRI of your back and looking for nerve impingement that could also be causing the problem.   If it gets worse not better, please give [place] a call sooner than later

## 2018-05-18 ENCOUNTER — Other Ambulatory Visit: Payer: Self-pay | Admitting: Family Medicine

## 2018-06-23 NOTE — Progress Notes (Signed)
Subjective:    Patient ID: Paula Lambert, female    DOB: 1970-07-20, 48 y.o.   MRN: [id number]  HPI:  Paula Lambert presents with R thoracic back pain that developed 48 hrs ago after sneezing "quite hard while I was cooking".  She denies any other acute injury/trauma prior to onset of sx's. She reports pain is constant, described as "throbbing/aching", rated 7/10. She denies change in bowel/bladder habits She reports RLE numbness/tingling, however this has been present prior to back pain. She reports "lifting arms and deep breathing" worsens pain She reports OTC NSAIDs, heating pad and rest reduces pain- last dose of Ibuprofen '200mg'$  was >24 hrs ago  Patient Care Team    Relationship Specialty Notifications Start End  Mellody Dance, DO PCP - General Family Medicine  06/05/16     Patient Active Problem List   Diagnosis Date Noted  . Acute right-sided thoracic back pain 06/24/2018  . Obesity (BMI 30-39.9) 04/13/2018  . Right foot pain 04/13/2018  . Type 2 diabetes mellitus with complication, without long-term current use of insulin ([place]) 10/12/2017  . Hypertension associated with diabetes ([place]) 10/12/2017  . Mixed diabetic hyperlipidemia associated with type 2 diabetes mellitus ([place]) 10/12/2017  . Elevated TSH 10/12/2017  . Chronic pansinusitis 09/08/2017  . ETD (Eustachian tube dysfunction), unspecified laterality 09/08/2017  . Eczema 07/14/2017  . Plantar fasciitis of right foot 11/24/2016  . Screening for breast cancer 09/24/2016  . Hypokalemia 09/15/2016  . Sinus tachycardia 09/15/2016  . Asthma exacerbation 09/14/2016  . Encounter for annual routine gynecological examination 07/09/2016  . Vaccination not carried out because of patient refusal 06/20/2016  . Morbid obesity ([place]) 06/20/2016  . Vitamin D deficiency 06/19/2016  . Cough variant asthma 06/05/2016  . Sleep apnea 06/05/2016  . Heart murmur 06/05/2016  . GERD (gastroesophageal reflux disease) 06/05/2016  .  Environmental and seasonal allergies 06/05/2016     Past Medical History:  Diagnosis Date  . Asthma   . GERD (gastroesophageal reflux disease)   . Heart murmur    leaking valve  . Hypertension   . Sleep apnea      Past Surgical History:  Procedure Laterality Date  . BREAST SURGERY     reduction  . LIPOMA EXCISION    . TENDON RELEASE Right    elbow  . TONSILLECTOMY       Family History  Problem Relation Age of Onset  . Aneurysm Mother   . Stroke Father   . Hypertension Father   . Prostate cancer Father   . Diabetes Father   . Aneurysm Maternal Uncle   . Stroke Maternal Grandmother   . Hypertension Maternal Grandmother   . Heart disease Maternal Grandmother   . Diabetes Maternal Grandmother   . Aneurysm Maternal Grandfather   . Stroke Paternal Grandmother      Social History   Substance and Sexual Activity  Drug Use No     Social History   Substance and Sexual Activity  Alcohol Use Yes   Comment: occasionally     Social History   Tobacco Use  Smoking Status Never Smoker  Smokeless Tobacco Never Used     Outpatient Encounter Medications as of 06/24/2018  Medication Sig Note  . ACCU-CHEK GUIDE test strip TEST FASTING LEVEL IN MORNING AND 2 HOURS AFTER LARGEST MEAL EACH DAY   . AMBULATORY NON FORMULARY MEDICATION Single glucometer with lancets, test strips   . atorvastatin (LIPITOR) 40 MG tablet Take 1 tablet (40 mg total) by mouth  daily.   . blood glucose meter kit and supplies KIT Dispense based on patient and insurance preference. Use in AM to test fasting and 2 hours after largest meal each day.   . cetirizine (ZYRTEC) 10 MG tablet Take 10 mg by mouth daily.   Marland Kitchen desoximetasone (TOPICORT) 0.25 % cream Apply 1 application topically 2 (two) times daily.   . fluticasone (FLOVENT HFA) 220 MCG/ACT inhaler 2 puffs twice daily   . ibuprofen (ADVIL,MOTRIN) 600 MG tablet Take 1 tablet (600 mg total) by mouth every 8 (eight) hours as needed. 09/08/2017:  Patient takes as needed  . losartan-hydrochlorothiazide (HYZAAR) 50-12.5 MG tablet TAKE 1 TABLET BY MOUTH EVERY DAY   . metFORMIN (GLUCOPHAGE) 500 MG tablet Take 1 tablet (500 mg total) by mouth 2 (two) times daily with a meal.   . metoprolol succinate (TOPROL-XL) 25 MG 24 hr tablet Take 1 tablet (25 mg total) by mouth daily. Take with or immediately following a meal.   . montelukast (SINGULAIR) 10 MG tablet TAKE 1 TABLET BY MOUTH EVERYDAY AT BEDTIME   . ONETOUCH DELICA LANCETS FINE MISC USE ONE LANCET TO CHECK BLOOD SUGARS EVERY MORNING FASTING AND 2 HOURS AFTER LARGEST MEAL.   Marland Kitchen PROAIR HFA 108 (90 Base) MCG/ACT inhaler 1 PUFF INHALE EVERY 4-6 HOURS AS NEEDED (PRN)   . Vitamin D, Ergocalciferol, (DRISDOL) 50000 units CAPS capsule TAKE ONE CAPSULE BY MOUTH ONE TIME PER WEEK   . cyclobenzaprine (FLEXERIL) 10 MG tablet Take 1 tablet (10 mg total) by mouth 3 (three) times daily as needed for muscle spasms.    No facility-administered encounter medications on file as of 06/24/2018.     Allergies: Ultram [tramadol hcl]; Amoxicillin; Penicillins; Sulfa antibiotics; Peanut oil; Sesame oil; and Other  Body mass index is 35.15 kg/m.  Blood pressure 115/77, pulse 83, temperature 98.5 F (36.9 C), height '5\' 2"'$  (1.575 m), weight 192 lb 3.2 oz (87.2 kg), last menstrual period 06/17/2018, SpO2 98 %.     Review of Systems  Constitutional: Positive for activity change and fatigue. Negative for appetite change, chills, diaphoresis, fever and unexpected weight change.  Respiratory: Negative for cough, chest tightness, shortness of breath, wheezing and stridor.   Cardiovascular: Negative for chest pain, palpitations and leg swelling.  Gastrointestinal: Negative for abdominal distention, abdominal pain, blood in stool, constipation, nausea, rectal pain and vomiting.  Genitourinary: Negative for difficulty urinating, dyspareunia, dysuria, flank pain and frequency.  Musculoskeletal: Positive for arthralgias,  back pain and myalgias. Negative for gait problem, neck pain and neck stiffness.  Neurological: Negative for dizziness and headaches.  Hematological: Does not bruise/bleed easily.  Psychiatric/Behavioral: Positive for sleep disturbance.       Objective:   Physical Exam Constitutional:      General: She is not in acute distress.    Appearance: Normal appearance. She is not ill-appearing, toxic-appearing or diaphoretic.  Eyes:     Extraocular Movements: Extraocular movements intact.     Conjunctiva/sclera: Conjunctivae normal.     Pupils: Pupils are equal, round, and reactive to light.  Cardiovascular:     Rate and Rhythm: Normal rate.     Pulses: Normal pulses.     Heart sounds: Normal heart sounds. No murmur. No friction rub. No gallop.   Pulmonary:     Effort: Pulmonary effort is normal. No respiratory distress.     Breath sounds: Normal breath sounds. No stridor. No wheezing, rhonchi or rales.  Chest:     Chest wall: No tenderness.  Abdominal:  General: Bowel sounds are normal. There is no distension.     Palpations: There is no mass.     Tenderness: There is no abdominal tenderness. There is no right CVA tenderness, left CVA tenderness, guarding or rebound.     Hernia: No hernia is present.  Musculoskeletal:        General: Tenderness present. No swelling, deformity or signs of injury.     Right hip: Normal.     Left hip: Normal.     Cervical back: Normal.     Thoracic back: She exhibits tenderness and spasm. She exhibits normal range of motion, no swelling and no edema.     Lumbar back: Normal.     Right lower leg: No edema.     Left lower leg: No edema.     Comments: Neg bil leg raises   Skin:    Capillary Refill: Capillary refill takes less than 2 seconds.  Neurological:     Mental Status: She is alert and oriented to person, place, and time.  Psychiatric:        Mood and Affect: Mood normal.        Behavior: Behavior normal.        Thought Content: Thought  content normal.        Judgment: Judgment normal.       Assessment & Plan:   1. Acute right-sided thoracic back pain     Acute right-sided thoracic back pain Continue OTC Ibuprofen '400mg'$ -'600mg'$  with food every 8 hrs as needed. Please use Cyclobenzaprine '10mg'$  every 8 hrs as needed. Would not recommend strenuous exercise for 1-2 weeks or until symptoms improve.    FOLLOW-UP:  Return if symptoms worsen or fail to improve.

## 2018-06-24 ENCOUNTER — Ambulatory Visit (INDEPENDENT_AMBULATORY_CARE_PROVIDER_SITE_OTHER): Payer: BLUE CROSS/BLUE SHIELD | Admitting: Adult Health

## 2018-06-24 ENCOUNTER — Encounter: Payer: Self-pay | Admitting: Adult Health

## 2018-06-24 VITALS — BP 115/77 | HR 83 | Temp 98.5°F | Ht 62.0 in | Wt 192.2 lb

## 2018-06-24 DIAGNOSIS — M546 Pain in thoracic spine: Secondary | ICD-10-CM | POA: Diagnosis not present

## 2018-06-24 MED ORDER — CYCLOBENZAPRINE HCL 10 MG PO TABS: 10.0000 mg | ORAL_TABLET | Freq: Three times a day (TID) | ORAL | 0 refills | Status: DC | PRN

## 2018-06-24 NOTE — Patient Instructions (Signed)
Acute Back Pain, Adult Acute back pain is sudden and usually short-lived. It is often caused by an injury to the muscles and tissues in the back. The injury may result from:  A muscle or ligament getting overstretched or torn (strained). Ligaments are tissues that connect bones to each other. Lifting something improperly can cause a back strain.  Wear and tear (degeneration) of the spinal disks. Spinal disks are circular tissue that provides cushioning between the bones of the spine (vertebrae).  Twisting motions, such as while playing sports or doing yard work.  A hit to the back.  Arthritis. You may have a physical exam, lab tests, and imaging tests to find the cause of your pain. Acute back pain usually goes away with rest and home care. Follow these instructions at home: Managing pain, stiffness, and swelling  Take over-the-counter and prescription medicines only as told by your health care provider.  Your health care provider may recommend applying ice during the first 24-48 hours after your pain starts. To do this: ? Put ice in a plastic bag. ? Place a towel between your skin and the bag. ? Leave the ice on for 20 minutes, 2-3 times a day.  If directed, apply heat to the affected area as often as told by your health care provider. Use the heat source that your health care provider recommends, such as a moist heat pack or a heating pad. ? Place a towel between your skin and the heat source. ? Leave the heat on for 20-30 minutes. ? Remove the heat if your skin turns bright red. This is especially important if you are unable to feel pain, heat, or cold. You have a greater risk of getting burned. Activity   Do not stay in bed. Staying in bed for more than 1-2 days can delay your recovery.  Sit up and stand up straight. Avoid leaning forward when you sit, or hunching over when you stand. ? If you work at a desk, sit close to it so you do not need to lean over. Keep your chin tucked  in. Keep your neck drawn back, and keep your elbows bent at a right angle. Your arms should look like the letter "L." ? Sit high and close to the steering wheel when you drive. Add lower back (lumbar) support to your car seat, if needed.  Take short walks on even surfaces as soon as you are able. Try to increase the length of time you walk each day.  Do not sit, drive, or stand in one place for more than 30 minutes at a time. Sitting or standing for long periods of time can put stress on your back.  Do not drive or use heavy machinery while taking prescription pain medicine.  Use proper lifting techniques. When you bend and lift, use positions that put less stress on your back: ? Bend your knees. ? Keep the load close to your body. ? Avoid twisting.  Exercise regularly as told by your health care provider. Exercising helps your back heal faster and helps prevent back injuries by keeping muscles strong and flexible.  Work with a physical therapist to make a safe exercise program, as recommended by your health care provider. Do any exercises as told by your physical therapist. Lifestyle  Maintain a healthy weight. Extra weight puts stress on your back and makes it difficult to have good posture.  Avoid activities or situations that make you feel anxious or stressed. Stress and anxiety increase muscle   tension and can make back pain worse. Learn ways to manage anxiety and stress, such as through exercise. General instructions  Sleep on a firm mattress in a comfortable position. Try lying on your side with your knees slightly bent. If you lie on your back, put a pillow under your knees.  Follow your treatment plan as told by your health care provider. This may include: ? Cognitive or behavioral therapy. ? Acupuncture or massage therapy. ? Meditation or yoga. Contact a health care provider if:  You have pain that is not relieved with rest or medicine.  You have increasing pain going down  into your legs or buttocks.  Your pain does not improve after 2 weeks.  You have pain at night.  You lose weight without trying.  You have a fever or chills. Get help right away if:  You develop new bowel or bladder control problems.  You have unusual weakness or numbness in your arms or legs.  You develop nausea or vomiting.  You develop abdominal pain.  You feel faint. Summary  Acute back pain is sudden and usually short-lived.  Use proper lifting techniques. When you bend and lift, use positions that put less stress on your back.  Take over-the-counter and prescription medicines and apply heat or ice as directed by your health care provider. This information is not intended to replace advice given to you by your health care provider. Make sure you discuss any questions you have with your health care provider. Document Released: 06/08/2005 Document Revised: 01/13/2018 Document Reviewed: 01/20/2017 Elsevier Interactive Patient Education  2019 [employer]   Continue OTC Ibuprofen 400mg -600mg  with food every 8 hrs as needed. Please use Cyclobenzaprine 10mg  every 8 hrs as needed. Would not recommend strenuous exercise for 1-2 weeks or until symptoms improve. Follow-up as needed. FEEL BETTER!

## 2018-06-24 NOTE — Assessment & Plan Note (Signed)
Continue OTC Ibuprofen 400mg -600mg  with food every 8 hrs as needed. Please use Cyclobenzaprine 10mg  every 8 hrs as needed. Would not recommend strenuous exercise for 1-2 weeks or until symptoms improve.

## 2018-07-06 ENCOUNTER — Other Ambulatory Visit: Payer: Self-pay | Admitting: Family Medicine

## 2018-07-14 ENCOUNTER — Encounter: Payer: Self-pay | Admitting: Family Medicine

## 2018-07-14 ENCOUNTER — Ambulatory Visit (INDEPENDENT_AMBULATORY_CARE_PROVIDER_SITE_OTHER): Payer: BLUE CROSS/BLUE SHIELD | Admitting: Family Medicine

## 2018-07-14 VITALS — BP 129/89 | HR 86 | Temp 98.4°F | Ht 62.0 in | Wt 184.0 lb

## 2018-07-14 DIAGNOSIS — E1159 Type 2 diabetes mellitus with other circulatory complications: Secondary | ICD-10-CM

## 2018-07-14 DIAGNOSIS — M940 Chondrocostal junction syndrome [Tietze]: Secondary | ICD-10-CM | POA: Diagnosis not present

## 2018-07-14 DIAGNOSIS — Z23 Encounter for immunization: Secondary | ICD-10-CM

## 2018-07-14 DIAGNOSIS — E118 Type 2 diabetes mellitus with unspecified complications: Secondary | ICD-10-CM

## 2018-07-14 DIAGNOSIS — I10 Essential (primary) hypertension: Secondary | ICD-10-CM

## 2018-07-14 MED ORDER — IBUPROFEN 600 MG PO TABS: 600.0000 mg | ORAL_TABLET | Freq: Three times a day (TID) | ORAL | 0 refills | Status: DC | PRN

## 2018-07-14 MED ORDER — METFORMIN HCL 500 MG PO TABS: 250.0000 mg | ORAL_TABLET | Freq: Two times a day (BID) | ORAL | 0 refills | Status: DC

## 2018-07-14 NOTE — Patient Instructions (Signed)
Great job girl!!!!    Keep it up!     - please email me in a week or so to remind me of the calorie eval of "your diet".  Again I recommend that you also use the lose it app or my fitness pal app to track everything you put in your mouth to see actually how many calories you are eating.  I will see you in 3 months with a full set of fasting blood work 3 to 5 days prior to that office visit-so please bring in your log then so we can review     Risk factors for prediabetes and type 2 diabetes  Researchers don't fully understand why some people develop prediabetes and type 2 diabetes and others don't.  It's clear that certain factors increase the risk, however, including:  Weight. The more fatty tissue you have, the more resistant your cells become to insulin.  Inactivity. The less active you are, the greater your risk. Physical activity helps you control your weight, uses up glucose as energy and makes your cells more sensitive to insulin.  Family history. Your risk increases if a parent or sibling has type 2 diabetes.  Race. Although it's unclear why, people of certain races - including blacks, Hispanics, American Indians and Asian-Americans - are at higher risk.  Age. Your risk increases as you get older. This may be because you tend to exercise less, lose muscle mass and gain weight as you age. But type 2 diabetes is also increasing dramatically among children, adolescents and younger adults.  Gestational diabetes. If you developed gestational diabetes when you were pregnant, your risk of developing prediabetes and type 2 diabetes later increases. If you gave birth to a baby weighing more than 9 pounds (4 kilograms), you're also at risk of type 2 diabetes.  Polycystic ovary syndrome. For women, having polycystic ovary syndrome - a common condition characterized by irregular menstrual periods, excess hair growth and obesity - increases the risk of diabetes.  High blood pressure. Having blood  pressure over 140/90 millimeters of mercury (mm Hg) is linked to an increased risk of type 2 diabetes.  Abnormal cholesterol and triglyceride levels. If you have low levels of high-density lipoprotein (HDL), or "good," cholesterol, your risk of type 2 diabetes is higher. Triglycerides are another type of fat carried in the blood. People with high levels of triglycerides have an increased risk of type 2 diabetes. Your doctor can let you know what your cholesterol and triglyceride levels are.  A good guide to good carbs: The glycemic index ---If you have diabetes, or at risk for diabetes, you know all too well that when you eat carbohydrates, your blood sugar goes up. The total amount of carbs you consume at a meal or in a snack mostly determines what your blood sugar will do. But the food itself also plays a role. A serving of white rice has almost the same effect as eating pure table sugar - a quick, high spike in blood sugar. A serving of lentils has a slower, smaller effect.  ---Picking good sources of carbs can help you control your blood sugar and your weight. Even if you don't have diabetes, eating healthier carbohydrate-rich foods can help ward off a host of chronic conditions, from heart disease to various cancers to, well, diabetes.  ---One way to choose foods is with the glycemic index (GI). This tool measures how much a food boosts blood sugar.  The glycemic index rates the effect  of a specific amount of a food on blood sugar compared with the same amount of pure glucose. A food with a glycemic index of 28 boosts blood sugar only 28% as much as pure glucose. One with a GI of 95 acts like pure glucose.    High glycemic foods result in a quick spike in insulin and blood sugar (also known as blood glucose).  Low glycemic foods have a slower, smaller effect- these are healthier for you.   Using the glycemic index Using the glycemic index is easy: choose foods in the low GI category instead of  those in the high GI category (see below), and go easy on those in between. Low glycemic index (GI of 55 or less): Most fruits and vegetables, beans, minimally processed grains, pasta, low-fat dairy foods, and nuts.  Moderate glycemic index (GI 56 to 69): White and sweet potatoes, corn, white rice, couscous, breakfast cereals such as Cream of Wheat and Mini Wheats.  High glycemic index (GI of 70 or higher): White bread, rice cakes, most crackers, bagels, cakes, doughnuts, croissants, most packaged breakfast cereals. You can see the values for 100 commons foods and get links to more at [email or web address].[email or web address].hu.  Swaps for lowering glycemic index  Instead of this high-glycemic index food Eat this lower-glycemic index food  White rice Brown rice or converted rice  Instant oatmeal Steel-cut oats  Cornflakes Bran flakes  Baked potato Pasta, bulgur  White bread Whole-grain bread  Corn Peas or leafy greens       Prediabetes Eating Plan  Prediabetes--also called impaired glucose tolerance or impaired fasting glucose--is a condition that causes blood sugar (blood glucose) levels to be higher than normal. Following a healthy diet can help to keep prediabetes under control. It can also help to lower the risk of type 2 diabetes and heart disease, which are increased in people who have prediabetes. Along with regular exercise, a healthy diet:  Promotes weight loss.  Helps to control blood sugar levels.  Helps to improve the way that the body uses insulin.   WHAT DO I NEED TO KNOW ABOUT THIS EATING PLAN?   Use the glycemic index (GI) to plan your meals. The index tells you how quickly a food will raise your blood sugar. Choose low-GI foods. These foods take a longer time to raise blood sugar.  Pay close attention to the amount of carbohydrates in the food that you eat. Carbohydrates increase blood sugar levels.  Keep track of how many calories you take in. Eating the right amount of  calories will help you to achieve a healthy weight. Losing about 7 percent of your starting weight can help to prevent type 2 diabetes.  You may want to follow a Mediterranean diet. This diet includes a lot of vegetables, lean meats or fish, whole grains, fruits, and healthy oils and fats.   WHAT FOODS CAN I EAT?  Grains Whole grains, such as whole-wheat or whole-grain breads, crackers, cereals, and pasta. Unsweetened oatmeal. Bulgur. Barley. Quinoa. Brown rice. Corn or whole-wheat flour tortillas or taco shells. Vegetables Lettuce. Spinach. Peas. Beets. Cauliflower. Cabbage. Broccoli. Carrots. Tomatoes. Squash. Eggplant. Herbs. Peppers. Onions. Cucumbers. Brussels sprouts. Fruits Berries. Bananas. Apples. Oranges. Grapes. Papaya. Mango. Pomegranate. Kiwi. Grapefruit. Cherries. Meats and Other Protein Sources Seafood. Lean meats, such as chicken and [place] or lean cuts of pork and beef. Tofu. Eggs. Nuts. Beans. Dairy Low-fat or fat-free dairy products, such as yogurt, cottage cheese, and cheese. Beverages Water. Tea. Coffee. Sugar-free or diet soda.  Seltzer water. Milk. Milk alternatives, such as soy or almond milk. Condiments Mustard. Relish. Low-fat, low-sugar ketchup. Low-fat, low-sugar barbecue sauce. Low-fat or fat-free mayonnaise. Sweets and Desserts Sugar-free or low-fat pudding. Sugar-free or low-fat ice cream and other frozen treats. Fats and Oils Avocado. Walnuts. Olive oil. The items listed above may not be a complete list of recommended foods or beverages. Contact your dietitian for more options.    WHAT FOODS ARE NOT RECOMMENDED?  Grains Refined white flour and flour products, such as bread, pasta, snack foods, and cereals. Beverages Sweetened drinks, such as sweet iced tea and soda. Sweets and Desserts Baked goods, such as cake, cupcakes, pastries, cookies, and cheesecake. The items listed above may not be a complete list of foods and beverages to avoid. Contact your  dietitian for more information.   This information is not intended to replace advice given to you by your health care provider. Make sure you discuss any questions you have with your health care provider.   Document Released: 10/23/2014 Document Reviewed: 10/23/2014 Elsevier Interactive Patient Education [employer]

## 2018-07-14 NOTE — Progress Notes (Signed)
Impression and Recommendations:    1. Type 2 diabetes mellitus with complication, without long-term current use of insulin (Paula Lambert)   2. Hypertension associated with diabetes (Paula Lambert)   3. Morbid obesity (Paula Lambert)   4. Costochondritis-    L anterior S-C jts T2-6   5. Flu vaccine need     1. Type 2 diabetes mellitus with complication, without long-term current use of insulin (Paula Lambert) - A1c was 6.0 last check.  Blood sugars stable at this time. - Cut dose of metformin in half, 250 mg twice daily  See med list below. - Patient tolerating meds well without complication.  Denies S-E.  - Counseled patient on pathophysiology of disease and discussed various treatment options, which often includes dietary and lifestyle modifications as first line.  Importance of low carb/ketogenic diet discussed with patient in addition to regular exercise.   - Check FBS and 2 hours after the biggest meal of your day.  Keep log and bring in next OV for my review.   Also, if you ever feel poorly, please check your blood pressure and blood sugar, as one or the other could be the cause of your symptoms.  - Being a diabetic, you need yearly eye and foot exams. Make appt.for diabetic eye exam.  2. Hypertension associated with diabetes (Paula Lambert) - Stable at this time. - Discontinue metoprolol.  See med list below. - Patient tolerating meds well without complication.  Denies S-E  -  Ambulatory BP monitoring strongly encouraged. Keep log and bring in next OV.  Advised patient to continue to keep a good eye on blood pressure, and update [place] with any concerns or changes.   - Ongoing lifestyle changes such as dash diet and engaging in a regular exercise program discussed with patient.  Educational handouts provided  3. Mixed Diabetic Hyperlipidemia - Stable at this time, managed on statins. - Continue treatment plan as prescribed.  See med list below. - Patient tolerating meds well without complication.  Denies S-E.  - Will  continue to monitor.  4. Muscle Tightness - Encouraged patient to go easier on her body as she adjusts to her new exercise routine.  - Advised patient to balance out whatever she is doing with her right-hand side, and engage in the same activities with the left.  - Discussed importance of being vigilant about noticing how she favors one side over the other, and attempting to even out this distribution.  - Reviewed red flag symptoms of chest pain, emphasizing chest tightness that comes on with activity and is relieved by rest.  Patient knows to keep an eye out for red flag symptoms.  - If symptoms worsen and do not improve, patient knows to return for further assessment and referral.  - Advised prudent use of ibuprofen or tylenol for relief PRN.  5. BMI Counseling - Body mass index is 33.65 kg/m Explained to patient what BMI refers to, and what it means medically.    Told patient to think about it as a "medical risk stratification measurement" and how increasing BMI is associated with increasing risk/ or worsening state of various diseases such as hypertension, hyperlipidemia, diabetes, premature OA, depression etc.  American Heart Association guidelines for healthy diet, basically Mediterranean diet, and exercise guidelines of 30 minutes 5 days per week or more discussed in detail.  Health counseling performed.  All questions answered.  - Encouraged patient to track her nutritional intake, and to consume adequate nutrition daily. - Advised patient to  review her diet and make sure that she is consuming enough calories per day.  6. Lifestyle & Preventative Health Maintenance - Advised patient to continue working toward exercising to improve overall mental, physical, and emotional health.    - Reviewed the "spokes of the wheel" of mood and health management.  Stressed the importance of ongoing prudent habits, including regular exercise, appropriate sleep hygiene, healthful dietary habits,  and prayer/meditation to relax.  - Encouraged patient to engage in daily physical activity, especially a formal exercise routine.  Recommended that the patient eventually strive for at least 150 minutes of moderate cardiovascular activity per week according to guidelines established by the [facility].   - Healthy dietary habits encouraged, including low-carb, and high amounts of lean protein in diet.   - Patient should also consume adequate amounts of water.   Education and routine counseling performed. Handouts provided.   Orders Placed This Encounter  Procedures  . Flu Vaccine QUAD 6+ mos PF IM (Fluarix Quad PF)    Medications Discontinued During This Encounter  Medication Reason  . ibuprofen (ADVIL,MOTRIN) 600 MG tablet Reorder  . metoprolol succinate (TOPROL-XL) 25 MG 24 hr tablet   . metFORMIN (GLUCOPHAGE) 500 MG tablet       Meds ordered this encounter  Medications  . ibuprofen (ADVIL,MOTRIN) 600 MG tablet    Sig: Take 1 tablet (600 mg total) by mouth every 8 (eight) hours as needed.    Dispense:  30 tablet    Refill:  0  . metFORMIN (GLUCOPHAGE) 500 MG tablet    Sig: Take 0.5 tablets (250 mg total) by mouth 2 (two) times daily with a meal.    Dispense:  180 tablet    Refill:  0    The patient was counseled, risk factors were discussed, anticipatory guidance given.  Gross side effects, risk and benefits, and alternatives of medications discussed with patient.  Patient is aware that all medications have potential side effects and we are unable to predict every side effect or drug-drug interaction that may occur.  Expresses verbal understanding and consents to current therapy plan and treatment regimen.   Return for 2) diabetes follow up every 3 mo w FBW 3 d prior.   Please see AVS handed out to patient at the end of our visit for further patient instructions/ counseling done pertaining to today's office visit.    Note:  This document was prepared using Dragon voice  recognition software and may include unintentional dictation errors.   This document serves as a record of services personally performed by Mellody Dance, DO. It was created on her behalf by Toni Amend, a trained medical scribe. The creation of this record is based on the scribe's personal observations and the provider's statements to them.   I have reviewed the above medical documentation for accuracy and completeness and I concur.  Mellody Dance, DO 07/17/2018 9:23 PM        Subjective:    Chief Complaint  Patient presents with  . Follow-up    Paula Lambert is a 48 y.o. female who presents to [place] at [facility] today for Diabetes Management.    Thinks she has lost eight pounds since last visit.  Weighed in yesterday and was down to 184 lbs.  New Exercise Habits Is doing a new regular exercise routine, including bar lifts, lifting 10 lbs with the bar, maybe up to 40 lbs. Denies pushing anything heavy.  Engages in sit ups, squats, and lunges.  Experiencing chest pressure, which she feels more in the mornings, after rest.  Denies SOB or sweating associated with chest pressure.  Denies jaw pain, but does have neck pain.  This pain does not come on with exercise.  DM HPI: -  She has been working on diet and exercise for diabetes  Pt is currently maintained on the following medications for diabetes:   see med list today Medication compliance - Continues taking her medications as prescribed.  Home glucose readings range: Fasting sugars this morning were 114. Usually runs 90's, sometimes 80's; rarely has 100's "unless it's after I eat dinner."  Dropped down to 67.  She didn't eat a whole lot yesterday.   Denies polyuria/polydipsia. Denies hypo/ hyperglycemia symptoms - She denies new onset of: chest pain, exercise intolerance, shortness of breath, dizziness, visual changes, headache, lower extremity swelling or claudication.   Last diabetic  eye exam was No results found for: HMDIABEYEEXA  Foot exam- UTD  Last A1C in the office was:  Lab Results  Component Value Date   HGBA1C 6.0 (H) 04/11/2018   HGBA1C 6.4 (A) 01/11/2018   HGBA1C 6.7 (H) 10/05/2017    Lab Results  Component Value Date   LDLCALC 119 (H) 10/05/2017   CREATININE 0.94 04/11/2018   1. HTN HPI:  -  Her blood pressure has been controlled at home.  Pt is checking it at home.   Running 130/80 or less at home, around 117-120/77 ish.  - Patient reports good compliance with blood pressure medications  - Denies medication S-E   - Smoking Status noted; never smoker.  Sometimes experiences dizziness.  Says "you just feel wonky; I can tell when my blood pressure is crazy."  Denies highs.  - She denies new onset of: chest pain, exercise intolerance, shortness of breath, dizziness, visual changes, headache, lower extremity swelling or claudication.   Last 3 blood pressure readings in our office are as follows: BP Readings from Last 3 Encounters:  07/17/18 (!) 148/99  07/14/18 129/89  06/24/18 115/77    Filed Weights   07/14/18 1352  Weight: 184 lb (83.5 kg)    The cholesterol last visit was:  Lab Results  Component Value Date   CHOL 189 10/05/2017   HDL 54 10/05/2017   LDLCALC 119 (H) 10/05/2017   TRIG 79 10/05/2017   CHOLHDL 3.5 10/05/2017    Hepatic Function Latest Ref Rng & Units 04/11/2018 11/22/2017 10/05/2017  Total Protein 6.0 - 8.5 g/dL 7.3 6.8 6.8  Albumin 3.5 - 5.5 g/dL 4.4 3.9 4.1  AST 0 - 40 IU/L _0 ALT 0 - 32 IU/L _1 Alk Phosphatase 39 - 117 IU/L 77 72 66  Total Bilirubin 0.0 - 1.2 mg/dL 0.5 0.4 0.5    BMI Readings from Last 3 Encounters:  07/14/18 33.65 kg/m  06/24/18 35.15 kg/m  04/13/18 33.65 kg/m     No problems updated.    Patient Care Team    Relationship Specialty Notifications Start End  Mellody Dance, DO PCP - General Family Medicine  06/05/16      Patient Active Problem List    Diagnosis Date Noted  . Type 2 diabetes mellitus with complication, without long-term current use of insulin ([place]) 10/12/2017    Priority: High  . Hypertension associated with diabetes ([place]) 10/12/2017    Priority: High  . Mixed diabetic hyperlipidemia associated with type 2 diabetes mellitus ([place]) 10/12/2017    Priority: High  . Morbid obesity ([place]) 06/20/2016  Priority: High  . Vaccination not carried out because of patient refusal 06/20/2016    Priority: Medium  . Cough variant asthma 06/05/2016    Priority: Medium  . Sleep apnea 06/05/2016    Priority: Medium  . Heart murmur 06/05/2016    Priority: Medium  . GERD (gastroesophageal reflux disease) 06/05/2016    Priority: Medium  . Elevated TSH 10/12/2017    Priority: Low  . Chronic pansinusitis 09/08/2017    Priority: Low  . Vitamin D deficiency 06/19/2016    Priority: Low  . Environmental and seasonal allergies 06/05/2016    Priority: Low  . Costochondritis-    L anterior S-C jts T2-6 07/14/2018  . Acute right-sided thoracic back pain 06/24/2018  . Obesity (BMI 30-39.9) 04/13/2018  . Right foot pain 04/13/2018  . ETD (Eustachian tube dysfunction), unspecified laterality 09/08/2017  . Eczema 07/14/2017  . Plantar fasciitis of right foot 11/24/2016  . Screening for breast cancer 09/24/2016  . Hypokalemia 09/15/2016  . Sinus tachycardia 09/15/2016  . Asthma exacerbation 09/14/2016  . Encounter for annual routine gynecological examination 07/09/2016     Past Medical History:  Diagnosis Date  . Asthma   . GERD (gastroesophageal reflux disease)   . Heart murmur    leaking valve  . Hypertension   . Sleep apnea      Past Surgical History:  Procedure Laterality Date  . BREAST SURGERY     reduction  . LIPOMA EXCISION    . TENDON RELEASE Right    elbow  . TONSILLECTOMY       Family History  Problem Relation Age of Onset  . Aneurysm Mother   . Stroke Father   . Hypertension Father   . Prostate cancer  Father   . Diabetes Father   . Heart attack Father   . Aneurysm Maternal Uncle   . Stroke Maternal Grandmother   . Hypertension Maternal Grandmother   . Heart disease Maternal Grandmother   . Diabetes Maternal Grandmother   . Aneurysm Maternal Grandfather   . Stroke Paternal Grandmother      Social History   Substance and Sexual Activity  Drug Use No  ,  Social History   Substance and Sexual Activity  Alcohol Use Yes   Comment: occasionally  ,  Social History   Tobacco Use  Smoking Status Never Smoker  Smokeless Tobacco Never Used  ,    Current Outpatient Medications on File Prior to Visit  Medication Sig Dispense Refill  . ACCU-CHEK GUIDE test strip TEST FASTING LEVEL IN MORNING AND 2 HOURS AFTER LARGEST MEAL EACH DAY 100 each 1  . AMBULATORY NON FORMULARY MEDICATION Single glucometer with lancets, test strips 1 each 0  . atorvastatin (LIPITOR) 40 MG tablet Take 1 tablet (40 mg total) by mouth daily. 90 tablet 3  . blood glucose meter kit and supplies KIT Dispense based on patient and insurance preference. Use in AM to test fasting and 2 hours after largest meal each day. 1 each 0  . cetirizine (ZYRTEC) 10 MG tablet Take 10 mg by mouth daily.    Marland Kitchen desoximetasone (TOPICORT) 0.25 % cream Apply 1 application topically 2 (two) times daily. 30 g 0  . fluticasone (FLOVENT HFA) 220 MCG/ACT inhaler 2 puffs twice daily 1 Inhaler 12  . losartan-hydrochlorothiazide (HYZAAR) 50-12.5 MG tablet TAKE 1 TABLET BY MOUTH EVERY DAY 90 tablet 0  . montelukast (SINGULAIR) 10 MG tablet TAKE 1 TABLET BY MOUTH EVERYDAY AT BEDTIME 90 tablet 1  .  ONETOUCH DELICA LANCETS FINE MISC USE ONE LANCET TO CHECK BLOOD SUGARS EVERY MORNING FASTING AND 2 HOURS AFTER LARGEST MEAL. 100 each 4  . PROAIR HFA 108 (90 Base) MCG/ACT inhaler 1 PUFF INHALE EVERY 4-6 HOURS AS NEEDED (PRN) 8.5 Inhaler 0  . Vitamin D, Ergocalciferol, (DRISDOL) 50000 units CAPS capsule TAKE ONE CAPSULE BY MOUTH ONE TIME PER WEEK 30  capsule 0  . cyclobenzaprine (FLEXERIL) 10 MG tablet Take 1 tablet (10 mg total) by mouth 3 (three) times daily as needed for muscle spasms. (Patient not taking: Reported on 07/14/2018) 30 tablet 0   No current facility-administered medications on file prior to visit.      Allergies  Allergen Reactions  . Ultram [Tramadol Hcl] Other (See Comments)    syncope  . Amoxicillin Hives and Swelling  . Penicillins Hives and Swelling  . Sulfa Antibiotics Hives  . Peanut Oil Other (See Comments)    Unknown, from allergy testing  . Sesame Oil Hives  . Other Other (See Comments)    PEANUTS AND ALMONDS     Review of Systems:   General:  Denies fever, chills Optho/Auditory:   Denies visual changes, blurred vision Respiratory:   Denies SOB, cough, wheeze, DIB  Cardiovascular:   Denies chest pain, palpitations, painful respirations Gastrointestinal:   Denies nausea, vomiting, diarrhea.  Endocrine:     Denies new hot or cold intolerance Musculoskeletal:  Denies joint swelling, gait issues, or new unexplained myalgias/ arthralgias Skin:  Denies rash, suspicious lesions  Neurological:    Denies dizziness, unexplained weakness, numbness  Psychiatric/Behavioral:   Denies mood changes    Objective:     Blood pressure 129/89, pulse 86, temperature 98.4 F (36.9 C), height _0  (1.575 m), weight 184 lb (83.5 kg), last menstrual period 06/30/2018, SpO2 98 %.  Body mass index is 33.65 kg/m.  General: Well Developed, well nourished, and in no acute distress.  HEENT: Normocephalic, atraumatic, pupils equal round reactive to light, neck supple, No carotid bruits, no JVD Skin: Warm and dry, cap RF less 2 sec Cardiac: Regular rate and rhythm, S1, S2 WNL's, no murmurs rubs or gallops Respiratory: ECTA B/L, Not using accessory muscles, speaking in full sentences. NeuroM-Sk: Ambulates w/o assistance, moves ext * 4 w/o difficulty, sensation grossly intact.  Ext: scant edema b/l lower ext Psych: No  HI/SI, judgement and insight good, Euthymic mood. Full Affect. Chest: Reproducible pain along the left anterior border of her sternal clavicular joints, especially from T2-T6 on the left.

## 2018-07-17 ENCOUNTER — Other Ambulatory Visit: Payer: Self-pay

## 2018-07-17 ENCOUNTER — Ambulatory Visit (HOSPITAL_COMMUNITY)
Admission: EM | Admit: 2018-07-17 | Discharge: 2018-07-17 | Disposition: A | Discharge status: Home / Self Care | Payer: BLUE CROSS/BLUE SHIELD | Attending: Family Medicine | Admitting: Family Medicine

## 2018-07-17 ENCOUNTER — Encounter (HOSPITAL_COMMUNITY): Payer: Self-pay | Admitting: *Deleted

## 2018-07-17 DIAGNOSIS — M7701 Medial epicondylitis, right elbow: Secondary | ICD-10-CM | POA: Insufficient documentation

## 2018-07-17 NOTE — ED Triage Notes (Addendum)
Reports waking up with right medial elbow swelling, tenderness without injury.  C/O parasthesia in right thumb. States was just taken off metoprolol few days ago per MD instructions.

## 2018-07-17 NOTE — ED Provider Notes (Signed)
[place]    CSN: [id number] Arrival date & time: 07/17/18  1627     History   Chief Complaint Chief Complaint  Patient presents with  . Arm Swelling    HPI Paula Lambert is a 48 y.o. female.   Patient complains of pain and swelling medial aspect of right elbow.  No history of any injury or trauma.  She has some tingling and numbness in her right thumb.  She denies neck pain or injury to the arm  HPI  Past Medical History:  Diagnosis Date  . Asthma   . GERD (gastroesophageal reflux disease)   . Heart murmur    leaking valve  . Hypertension   . Sleep apnea     Patient Active Problem List   Diagnosis Date Noted  . Costochondritis-    L anterior S-C jts T2-6 07/14/2018  . Acute right-sided thoracic back pain 06/24/2018  . Obesity (BMI 30-39.9) 04/13/2018  . Right foot pain 04/13/2018  . Type 2 diabetes mellitus with complication, without long-term current use of insulin ([place]) 10/12/2017  . Hypertension associated with diabetes ([place]) 10/12/2017  . Mixed diabetic hyperlipidemia associated with type 2 diabetes mellitus ([place]) 10/12/2017  . Elevated TSH 10/12/2017  . Chronic pansinusitis 09/08/2017  . ETD (Eustachian tube dysfunction), unspecified laterality 09/08/2017  . Eczema 07/14/2017  . Plantar fasciitis of right foot 11/24/2016  . Screening for breast cancer 09/24/2016  . Hypokalemia 09/15/2016  . Sinus tachycardia 09/15/2016  . Asthma exacerbation 09/14/2016  . Encounter for annual routine gynecological examination 07/09/2016  . Vaccination not carried out because of patient refusal 06/20/2016  . Morbid obesity ([place]) 06/20/2016  . Vitamin D deficiency 06/19/2016  . Cough variant asthma 06/05/2016  . Sleep apnea 06/05/2016  . Heart murmur 06/05/2016  . GERD (gastroesophageal reflux disease) 06/05/2016  . Environmental and seasonal allergies 06/05/2016    Past Surgical History:  Procedure Laterality Date  . BREAST SURGERY     reduction  .  LIPOMA EXCISION    . TENDON RELEASE Right    elbow  . TONSILLECTOMY      OB History   No obstetric history on file.      Home Medications    Prior to Admission medications   Medication Sig Start Date End Date Taking? Authorizing Provider  atorvastatin (LIPITOR) 40 MG tablet Take 1 tablet (40 mg total) by mouth daily. 10/12/17  Yes Opalski, Neoma Laming, DO  cetirizine (ZYRTEC) 10 MG tablet Take 10 mg by mouth daily.   Yes [provider]  fluticasone (FLOVENT HFA) 220 MCG/ACT inhaler 2 puffs twice daily 09/08/17  Yes Opalski, Neoma Laming, DO  losartan-hydrochlorothiazide (HYZAAR) 50-12.5 MG tablet TAKE 1 TABLET BY MOUTH EVERY DAY 01/06/18  Yes Opalski, Neoma Laming, DO  metFORMIN (GLUCOPHAGE) 500 MG tablet Take 0.5 tablets (250 mg total) by mouth 2 (two) times daily with a meal. 07/14/18  Yes Opalski, Deborah, DO  montelukast (SINGULAIR) 10 MG tablet TAKE 1 TABLET BY MOUTH EVERYDAY AT BEDTIME 12/03/17  Yes Opalski, Deborah, DO  PROAIR HFA 108 (90 Base) MCG/ACT inhaler 1 PUFF INHALE EVERY 4-6 HOURS AS NEEDED (PRN) 12/08/16  Yes Opalski, Deborah, DO  Vitamin D, Ergocalciferol, (DRISDOL) 50000 units CAPS capsule TAKE ONE CAPSULE BY MOUTH ONE TIME PER WEEK 12/03/17  Yes Opalski, Deborah, DO  ACCU-CHEK GUIDE test strip TEST FASTING LEVEL IN MORNING AND 2 HOURS AFTER LARGEST MEAL EACH DAY 12/03/17   Opalski, Deborah, DO  AMBULATORY NON FORMULARY MEDICATION Single glucometer with lancets, test  strips 10/12/17   Opalski, Deborah, DO  blood glucose meter kit and supplies KIT Dispense based on patient and insurance preference. Use in AM to test fasting and 2 hours after largest meal each day. 10/12/17   Opalski, Neoma Laming, DO  cyclobenzaprine (FLEXERIL) 10 MG tablet Take 1 tablet (10 mg total) by mouth 3 (three) times daily as needed for muscle spasms. Patient not taking: Reported on 07/14/2018 06/24/18   Mina Marble D, NP  desoximetasone (TOPICORT) 0.25 % cream Apply 1 application topically 2 (two) times daily.  07/14/17   Opalski, Neoma Laming, DO  ibuprofen (ADVIL,MOTRIN) 600 MG tablet Take 1 tablet (600 mg total) by mouth every 8 (eight) hours as needed. 07/14/18   Opalski, Neoma Laming, DO  ONETOUCH DELICA LANCETS FINE MISC USE ONE LANCET TO CHECK BLOOD SUGARS EVERY MORNING FASTING AND 2 HOURS AFTER LARGEST MEAL. 08/30/17   Mellody Dance, DO    Family History Family History  Problem Relation Age of Onset  . Aneurysm Mother   . Stroke Father   . Hypertension Father   . Prostate cancer Father   . Diabetes Father   . Heart attack Father   . Aneurysm Maternal Uncle   . Stroke Maternal Grandmother   . Hypertension Maternal Grandmother   . Heart disease Maternal Grandmother   . Diabetes Maternal Grandmother   . Aneurysm Maternal Grandfather   . Stroke Paternal Grandmother     Social History Social History   Tobacco Use  . Smoking status: Never Smoker  . Smokeless tobacco: Never Used  Substance Use Topics  . Alcohol use: Yes    Comment: occasionally  . Drug use: No     Allergies   Ultram [tramadol hcl]; Amoxicillin; Penicillins; Sulfa antibiotics; Peanut oil; Sesame oil; and Other   Review of Systems Review of Systems  Musculoskeletal: Positive for arthralgias and joint swelling.  All other systems reviewed and are negative.    Physical Exam Triage Vital Signs ED Triage Vitals  Enc Vitals Group     BP 07/17/18 1700 (!) 148/99     Pulse Rate 07/17/18 1700 (!) 122     Resp 07/17/18 1700 18     Temp 07/17/18 1700 98.1 F (36.7 C)     Temp Source 07/17/18 1700 Oral     SpO2 07/17/18 1700 100 %     Weight --      Height --      Head Circumference --      Peak Flow --      Pain Score 07/17/18 1706 2     Pain Loc --      Pain Edu? --      Excl. in [place]? --    No data found.  Updated Vital Signs BP (!) 148/99 (BP Location: Left Arm)   Pulse (!) 122   Temp 98.1 F (36.7 C) (Oral)   Resp 18   LMP 06/30/2018   SpO2 100%   Visual Acuity Right Eye Distance:   Left Eye  Distance:   Bilateral Distance:    Right Eye Near:   Left Eye Near:    Bilateral Near:     Physical Exam Constitutional:      Appearance: Normal appearance.  Musculoskeletal:     Comments: Right arm there is tenderness on the medial epicondyle.  Reflexes are symmetric There is no neck pain.  Neurological:     Mental Status: She is alert.      UC Treatments / Results  Labs (all labs  ordered are listed, but only abnormal results are displayed) Labs Reviewed - No data to display  EKG None  Radiology No results found.  Procedures Procedures (including critical care time)  Medications Ordered in UC Medications - No data to display  Initial Impression / Assessment and Plan / UC Course  I have reviewed the triage vital signs and the nursing notes.  Pertinent labs & imaging results that were available during my care of the patient were reviewed by me and considered in my medical decision making (see chart for details).     Differential diagnosis could include nerve entrapment at the wrist, medial epicondylitis, or cervical disc disease.  She has elbow strap that she had worn before I vascular to use that along with Aleve.  If symptoms do not resolve may need further testing such as nerve conduction or neurosurgical consultation Final Clinical Impressions(s) / UC Diagnoses   Final diagnoses:  Medial epicondylitis of elbow, right     Discharge Instructions     Wear elbow strap Take Aleve once or twice per day   ED Prescriptions    None     Controlled Substance Prescriptions [place] Controlled Substance Registry consulted? No   Wardell Honour, MD 07/17/18 [phone]

## 2018-07-17 NOTE — Discharge Instructions (Addendum)
Wear elbow strap Take Aleve once or twice per day

## 2018-07-22 ENCOUNTER — Ambulatory Visit: Payer: BLUE CROSS/BLUE SHIELD | Admitting: Family Medicine

## 2018-07-24 ENCOUNTER — Other Ambulatory Visit: Payer: Self-pay | Admitting: Family Medicine

## 2018-09-24 ENCOUNTER — Other Ambulatory Visit: Payer: Self-pay | Admitting: Family Medicine

## 2018-09-27 ENCOUNTER — Telehealth: Payer: Self-pay

## 2018-09-27 NOTE — Telephone Encounter (Signed)
Patient called and states that her BP is low at 104/70 and she feels weak and slight dizziness that is worse with movement.  Patient states that the symptoms started today around noon when she woke up.  She ate a fish sandwich at that time.  Patient's current BS 90 - patient checked while on the phone.  Patient rechecked her BP and it is 116/78 now. Denies any other symptoms no SOB, no fever, no pain anywhere, patient is not in distress, etc.  Patient is going to eat dinner.  Patient works at night from home. Spoke with Dr. Sharee Holster patient advised to make appointment with her for tomorrow and in the meantime not to take BP meds or flexeril while BP is low.  Also check BS 2 hours after largest meal and then fasting and if low do not take meds. And to push fluids.  Reviewed red flag signs with patient and advised that if she has worsening of symptoms through the night or any new symptoms then she needs to go to the ER or [facility] as soon as possible. Patient expressed understanding. MPulliam, CMA/RT(R)

## 2018-09-28 ENCOUNTER — Other Ambulatory Visit: Payer: Self-pay

## 2018-09-28 ENCOUNTER — Ambulatory Visit (INDEPENDENT_AMBULATORY_CARE_PROVIDER_SITE_OTHER): Payer: BLUE CROSS/BLUE SHIELD | Admitting: Family Medicine

## 2018-09-28 ENCOUNTER — Encounter: Payer: Self-pay | Admitting: Family Medicine

## 2018-09-28 ENCOUNTER — Ambulatory Visit: Payer: BLUE CROSS/BLUE SHIELD | Admitting: Family Medicine

## 2018-09-28 VITALS — BP 116/89 | HR 85 | Ht 62.0 in | Wt 186.0 lb

## 2018-09-28 DIAGNOSIS — E1159 Type 2 diabetes mellitus with other circulatory complications: Secondary | ICD-10-CM | POA: Diagnosis not present

## 2018-09-28 DIAGNOSIS — I152 Hypertension secondary to endocrine disorders: Secondary | ICD-10-CM

## 2018-09-28 DIAGNOSIS — R42 Dizziness and giddiness: Secondary | ICD-10-CM | POA: Diagnosis not present

## 2018-09-28 DIAGNOSIS — R031 Nonspecific low blood-pressure reading: Secondary | ICD-10-CM

## 2018-09-28 DIAGNOSIS — E118 Type 2 diabetes mellitus with unspecified complications: Secondary | ICD-10-CM

## 2018-09-28 DIAGNOSIS — I10 Essential (primary) hypertension: Secondary | ICD-10-CM

## 2018-09-28 DIAGNOSIS — Z8249 Family history of ischemic heart disease and other diseases of the circulatory system: Secondary | ICD-10-CM | POA: Insufficient documentation

## 2018-09-28 DIAGNOSIS — R4789 Other speech disturbances: Secondary | ICD-10-CM

## 2018-09-28 NOTE — Progress Notes (Signed)
Virtual Visit via Telephone Note for [employer], D.O- Primary Care Physician at [facility]   I connected with current patient today by  and verified that I am speaking with the correct person using two identifiers.   Because of federal recommendations of social distancing due to the current novel COVID-19 outbreak, an audio/video telehealth visit is felt to be most appropriate for this patient at this time.  My staff members also discussed with the patient that there may be a patient charge related to this service.   The patient expressed understanding, and agreed to proceed.    History of Present Illness:   Per phone note to our practice yesterday-see below.    An office visit was made for the patient today to discuss her symptoms.  She did have 1 lower blood pressure 104/70 and felt a little weak as well as a little dizzy during that one episode-this was yesterday a.m.  This was when she first woke up, which was around noon.  (Patient works night shift from home ) she had not eaten anything the night before or that day yet.  She ate something a little later when she rechecked her blood pressure it was 116/78.  Review of patient's blood pressures revealed that occasionally she does go low and was 111/79 with a pulse of 72 in the past as well is 112/84 with a pulse of 80 and historically systolic blood pressures have stayed in the 110s-120s.  ROS: Patient denies currently any visual changes, heart palpitations, chest pain, difficulty breathing, cough, shortness of breath, fever or chills, dysuria, increased frequency or urgency, or vaginal discharge.  She denies any nausea vomiting or diarrhea.  She does state that lately she has not been feeling hungry, thus may be is not eating as much as she normally would.  Also, patient has not been sleeping quite as good as she normally does.  She also has concerns because since the increased stress of the last month or so of the Covid-19 scare,  she has been having occasionally some word finding difficulties.  She mentions this at the end of the office visit today since she does have a family member who died of a brain aneurysm.  She has no other neurological symptoms associated with this today.  Symptoms also appear to come on more so with stress/when she is excessively worrying  Blood pressures since that episode and today have been in the [id number] systolically.  And she has not had any further episodes.  Patient has a history of very well controlled diabetes that was first diagnosed on 10/05/2017 with an A1c of 6.7.   Since then she has been rigorously working on diet and exercise.  However as of late, she has been more stressed than usual and fearful about covid-19 etc.  She is sleeping okay.   And last full set of blood work was done 10/05/2017.  Which was essentially all reassuring, thyroid labs and CMP were also obtained in June and CMP, A1c in 10 of 2019.  And again, feels well otherwise    Jerilee Field, [place]   09/27/18 5:29 PM  Note    Patient called and states that her BP is low at 104/70 and she feels weak and slight dizziness that is worse with movement.  Patient states that the symptoms started today around noon when she woke up.  She ate a fish sandwich at that time.  Patient's current BS 90 -  patient checked while on the phone.  Patient rechecked her BP and it is 116/78 now. Denies any other symptoms no SOB, no fever, no pain anywhere, patient is not in distress, etc.  Patient is going to eat dinner.  Patient works at night from home. Spoke with Dr. Raliegh Scarlet patient advised to make appointment with her for tomorrow and in the meantime not to take BP meds or flexeril while BP is low.  Also check BS 2 hours after largest meal and then fasting and if low do not take meds. And to push fluids.  Reviewed red flag signs with patient and advised that if she has worsening of symptoms through the night or any new symptoms then she needs  to go to the ER or [facility] as soon as possible. Patient expressed understanding. MPulliam, CMA/RT(R)        Wt Readings from Last 3 Encounters:  09/28/18 186 lb (84.4 kg)  07/14/18 184 lb (83.5 kg)  06/24/18 192 lb 3.2 oz (87.2 kg)    BP Readings from Last 3 Encounters:  09/28/18 116/89  07/17/18 (!) 148/99  07/14/18 129/89    Pulse Readings from Last 3 Encounters:  09/28/18 85  07/17/18 (!) 122  07/14/18 86    BMI Readings from Last 3 Encounters:  09/28/18 34.02 kg/m  07/14/18 33.65 kg/m  06/24/18 35.15 kg/m      -Vitals obtained; Medications, allergies reconciled;  personal medical, social, Sx etc. etc. histories were updated by Lanier Prude the medical assistant today and are reflected in below chart   Patient Care Team    Relationship Specialty Notifications Start End  Mellody Dance, DO PCP - General Family Medicine  06/05/16      Patient Active Problem List   Diagnosis Date Noted  . Type 2 diabetes mellitus with complication, without long-term current use of insulin ([place]) 10/12/2017    Priority: High  . Hypertension associated with diabetes ([place]) 10/12/2017    Priority: High  . Mixed diabetic hyperlipidemia associated with type 2 diabetes mellitus ([place]) 10/12/2017    Priority: High  . Morbid obesity ([place]) 06/20/2016    Priority: High  . Vaccination not carried out because of patient refusal 06/20/2016    Priority: Medium  . Cough variant asthma 06/05/2016    Priority: Medium  . Sleep apnea 06/05/2016    Priority: Medium  . Heart murmur 06/05/2016    Priority: Medium  . GERD (gastroesophageal reflux disease) 06/05/2016    Priority: Medium  . Elevated TSH 10/12/2017    Priority: Low  . Chronic pansinusitis 09/08/2017    Priority: Low  . Vitamin D deficiency 06/19/2016    Priority: Low  . Environmental and seasonal allergies 06/05/2016    Priority: Low  . Family history of aneurysm of blood vessel of brain 09/28/2018  .  Costochondritis-    L anterior S-C jts T2-6 07/14/2018  . Acute right-sided thoracic back pain 06/24/2018  . Obesity (BMI 30-39.9) 04/13/2018  . Right foot pain 04/13/2018  . ETD (Eustachian tube dysfunction), unspecified laterality 09/08/2017  . Eczema 07/14/2017  . Plantar fasciitis of right foot 11/24/2016  . Screening for breast cancer 09/24/2016  . Hypokalemia 09/15/2016  . Sinus tachycardia 09/15/2016  . Asthma exacerbation 09/14/2016  . Encounter for annual routine gynecological examination 07/09/2016     Current Meds  Medication Sig  . ACCU-CHEK GUIDE test strip TEST FASTING LEVEL IN MORNING AND 2 HOURS AFTER LARGEST MEAL EACH DAY  . AMBULATORY NON FORMULARY  MEDICATION Single glucometer with lancets, test strips  . atorvastatin (LIPITOR) 40 MG tablet Take 1 tablet (40 mg total) by mouth daily.  . blood glucose meter kit and supplies KIT Dispense based on patient and insurance preference. Use in AM to test fasting and 2 hours after largest meal each day.  . cetirizine (ZYRTEC) 10 MG tablet Take 10 mg by mouth daily.  . cyclobenzaprine (FLEXERIL) 10 MG tablet Take 1 tablet (10 mg total) by mouth 3 (three) times daily as needed for muscle spasms.  Marland Kitchen desoximetasone (TOPICORT) 0.25 % cream Apply 1 application topically 2 (two) times daily.  . fluticasone (FLOVENT HFA) 220 MCG/ACT inhaler 2 puffs twice daily  . ibuprofen (ADVIL,MOTRIN) 600 MG tablet Take 1 tablet (600 mg total) by mouth every 8 (eight) hours as needed.  Marland Kitchen losartan-hydrochlorothiazide (HYZAAR) 50-12.5 MG tablet TAKE 1 TABLET BY MOUTH EVERY DAY  . metFORMIN (GLUCOPHAGE) 500 MG tablet Take 0.5 tablets (250 mg total) by mouth 2 (two) times daily with a meal.  . montelukast (SINGULAIR) 10 MG tablet TAKE 1 TABLET BY MOUTH EVERYDAY AT BEDTIME  . ONETOUCH DELICA LANCETS FINE MISC USE ONE LANCET TO CHECK BLOOD SUGARS EVERY MORNING FASTING AND 2 HOURS AFTER LARGEST MEAL.  Marland Kitchen PROAIR HFA 108 (90 Base) MCG/ACT inhaler 1 PUFF INHALE  EVERY 4-6 HOURS AS NEEDED (PRN)  . Vitamin D, Ergocalciferol, (DRISDOL) 50000 units CAPS capsule TAKE ONE CAPSULE BY MOUTH ONE TIME PER WEEK     Allergies:  Allergies  Allergen Reactions  . Ultram [Tramadol Hcl] Other (See Comments)    syncope  . Amoxicillin Hives and Swelling  . Penicillins Hives and Swelling  . Sulfa Antibiotics Hives  . Peanut Oil Other (See Comments)    Unknown, from allergy testing  . Sesame Oil Hives  . Other Other (See Comments)    PEANUTS AND ALMONDS     ROS:  See above HPI for pertinent positives and negatives   Objective:   Blood pressure 116/89, pulse 85, height '5\' 2"'$  (1.575 m), weight 186 lb (84.4 kg), last menstrual period 09/14/2018. (if some vitals are omitted, this means that patient was UNABLE to obtain them even though asked to get them prior to [place] today) General: sounds in no acute distress.  Skin: Pt confirms warm and dry  extremities and pink fingertips Respiratory: speaking in full sentences, no conversational dyspnea Psych: A and O *3, appears insight good, mood- full      Impression and Recommendations:      ICD-10-CM   1. Low blood pressure reading- single episode R03.1   2. Orthostatic lightheadedness R42   3. Type 2 diabetes mellitus with complication, without long-term current use of insulin ([name]) E11.8                                49moago [id number]             [id number]             [id number] Hgb A1c MFr Bld 4.8 - 5.6 % 6.0   6.4           6.7           6.0     4. Hypertension associated with diabetes ([name])[place]1.59    I10   5. Word finding difficulty- episodic 1 mo or so R47.89   6. Family history of aneurysm of blood vessel of  brain Z82.49    -Discussed with patient to monitor her fasting blood sugars as well as 2-hour postprandials.  We will bring her into check her A1c along with other blood work in the near future however, patient declines to come in today or over the next few days due to covid-19's concerns of hers.   Discussed regular dietary habits are important and exercising every day which she has not been good at lately would be excellent for her stress.  -We discussed proper hydration of one half of her weight in ounces of water per day which patient has not been good at drinking, she states she always drinks more when she exercises and is more focused on her health.   -Discussed with patient that word finding difficulties of a mild nature such as she has can be associated with increased stress-such as she is under right now.  Advised patient if she develops any other neurological symptoms, she will notify us and if appropriate, may refer her to neurology.    Long discussion patient regarding her covid-19 concerns and worries.  Tried to reassure her that as long as she stays home and stay safe, she should not have concern.  As part of my medical decision making, I reviewed the following data within the [place] History obtained from pt/family, CMA notes reviewed and incorporated, Labs reviewed, Radiograph/ tests reviewed if applicable and OV notes from prior OV's with me, as well as other specialists he has seen since seeing me last, were all reviewed and used in my medical decision making process today. Additionally, discussion had with patient regarding txmnt plan, their biases about that plan etc were used in my medical decision making today.  I discussed the assessment and treatment plan with the patient. The patient was provided an opportunity to ask questions and all were answered.  The patient agreed with the plan and demonstrated an understanding of the instructions.   No barriers to understanding were identified.  Red flag symptoms and signs discussed in detail.  Patient expressed understanding regarding what to do in case of emergency\urgent symptoms   The patient was advised to call back or seek an in-person evaluation if the symptoms worsen or if the condition fails to improve as  anticipated.   Return for As soon as covid-19 risk decreases, come in for yearly physical with full set blood work.   **Gross side effects, risk and benefits, and alternatives of medications and treatment plan in general discussed with patient.  Patient is aware that all medications have potential side effects and we are unable to predict every side effect or drug-drug interaction that may occur.   Patient was strongly encouraged to call with any questions or concerns they may have concerns.     I provided 28+ minutes of non-face-to-face time during this encounter.   Mellody Dance, DO

## 2018-10-10 ENCOUNTER — Other Ambulatory Visit: Payer: Self-pay

## 2018-10-10 ENCOUNTER — Other Ambulatory Visit: Payer: BLUE CROSS/BLUE SHIELD

## 2018-10-10 DIAGNOSIS — E1159 Type 2 diabetes mellitus with other circulatory complications: Secondary | ICD-10-CM | POA: Diagnosis not present

## 2018-10-10 DIAGNOSIS — E876 Hypokalemia: Secondary | ICD-10-CM

## 2018-10-10 DIAGNOSIS — E782 Mixed hyperlipidemia: Secondary | ICD-10-CM

## 2018-10-10 DIAGNOSIS — E118 Type 2 diabetes mellitus with unspecified complications: Secondary | ICD-10-CM | POA: Diagnosis not present

## 2018-10-10 DIAGNOSIS — E1169 Type 2 diabetes mellitus with other specified complication: Secondary | ICD-10-CM | POA: Diagnosis not present

## 2018-10-10 DIAGNOSIS — E559 Vitamin D deficiency, unspecified: Secondary | ICD-10-CM | POA: Diagnosis not present

## 2018-10-10 DIAGNOSIS — I10 Essential (primary) hypertension: Principal | ICD-10-CM

## 2018-10-10 NOTE — Addendum Note (Signed)
Addended by: Leda Min D on: 10/10/2018 09:37 AM   Modules accepted: Orders

## 2018-10-11 ENCOUNTER — Other Ambulatory Visit: Payer: Self-pay | Admitting: Family Medicine

## 2018-10-11 DIAGNOSIS — E1169 Type 2 diabetes mellitus with other specified complication: Secondary | ICD-10-CM

## 2018-10-11 DIAGNOSIS — E782 Mixed hyperlipidemia: Secondary | ICD-10-CM

## 2018-10-11 DIAGNOSIS — E118 Type 2 diabetes mellitus with unspecified complications: Secondary | ICD-10-CM

## 2018-10-11 LAB — COMPREHENSIVE METABOLIC PANEL
ALT: 16 IU/L (ref 0–32)
AST: 18 IU/L (ref 0–40)
Albumin/Globulin Ratio: 1.9 (ref 1.2–2.2)
Albumin: 4.4 g/dL (ref 3.8–4.8)
Alkaline Phosphatase: 72 IU/L (ref 39–117)
BUN/Creatinine Ratio: 11 (ref 9–23)
BUN: 10 mg/dL (ref 6–24)
Bilirubin Total: 0.8 mg/dL (ref 0.0–1.2)
CO2: 24 mmol/L (ref 20–29)
Calcium: 9.2 mg/dL (ref 8.7–10.2)
Chloride: 98 mmol/L (ref 96–106)
Creatinine, Ser: 0.92 mg/dL (ref 0.57–1.00)
GFR calc Af Amer: 86 mL/min/{1.73_m2} (ref >59)
GFR calc non Af Amer: 75 mL/min/{1.73_m2} (ref >59)
Globulin, Total: 2.3 g/dL (ref 1.5–4.5)
Glucose: 104 mg/dL — ABNORMAL HIGH (ref 65–99)
Potassium: 3.9 mmol/L (ref 3.5–5.2)
Sodium: 138 mmol/L (ref 134–144)
Total Protein: 6.7 g/dL (ref 6.0–8.5)

## 2018-10-11 LAB — CBC WITH DIFFERENTIAL/PLATELET
Basophils Absolute: 0 10*3/uL (ref 0.0–0.2)
Basos: 0 %
EOS (ABSOLUTE): 0 10*3/uL (ref 0.0–0.4)
Eos: 1 %
Hematocrit: 37.6 % (ref 34.0–46.6)
Hemoglobin: 12.3 g/dL (ref 11.1–15.9)
Immature Grans (Abs): 0 10*3/uL (ref 0.0–0.1)
Immature Granulocytes: 0 %
Lymphocytes Absolute: 3.4 10*3/uL — ABNORMAL HIGH (ref 0.7–3.1)
Lymphs: 49 %
MCH: 28.9 pg (ref 26.6–33.0)
MCHC: 32.7 g/dL (ref 31.5–35.7)
MCV: 89 fL (ref 79–97)
Monocytes Absolute: 0.5 10*3/uL (ref 0.1–0.9)
Monocytes: 7 %
Neutrophils Absolute: 2.9 10*3/uL (ref 1.4–7.0)
Neutrophils: 43 %
Platelets: 210 10*3/uL (ref 150–450)
RBC: 4.25 x10E6/uL (ref 3.77–5.28)
RDW: 14.7 % (ref 11.7–15.4)
WBC: 6.9 10*3/uL (ref 3.4–10.8)

## 2018-10-11 LAB — LIPID PANEL
Chol/HDL Ratio: 2.3 ratio (ref 0.0–4.4)
Cholesterol, Total: 122 mg/dL (ref 100–199)
HDL: 53 mg/dL (ref >39)
LDL Calculated: 56 mg/dL (ref 0–99)
Triglycerides: 66 mg/dL (ref 0–149)
VLDL Cholesterol Cal: 13 mg/dL (ref 5–40)

## 2018-10-11 LAB — T4, FREE: Free T4: 1.16 ng/dL (ref 0.82–1.77)

## 2018-10-11 LAB — VITAMIN D 25 HYDROXY (VIT D DEFICIENCY, FRACTURES): Vit D, 25-Hydroxy: 60.9 ng/mL (ref 30.0–100.0)

## 2018-10-11 LAB — HEMOGLOBIN A1C
Est. average glucose Bld gHb Est-mCnc: 128 mg/dL
Hgb A1c MFr Bld: 6.1 % — ABNORMAL HIGH (ref 4.8–5.6)

## 2018-10-11 LAB — TSH: TSH: 5.03 u[IU]/mL — ABNORMAL HIGH (ref 0.450–4.500)

## 2018-10-13 ENCOUNTER — Encounter: Payer: Self-pay | Admitting: Family Medicine

## 2018-10-13 ENCOUNTER — Other Ambulatory Visit: Payer: Self-pay

## 2018-10-13 ENCOUNTER — Ambulatory Visit (INDEPENDENT_AMBULATORY_CARE_PROVIDER_SITE_OTHER): Payer: BLUE CROSS/BLUE SHIELD | Admitting: Family Medicine

## 2018-10-13 VITALS — BP 128/90 | HR 89 | Temp 97.9°F | Ht 62.0 in | Wt 184.6 lb

## 2018-10-13 DIAGNOSIS — E118 Type 2 diabetes mellitus with unspecified complications: Secondary | ICD-10-CM

## 2018-10-13 DIAGNOSIS — E559 Vitamin D deficiency, unspecified: Secondary | ICD-10-CM

## 2018-10-13 DIAGNOSIS — R42 Dizziness and giddiness: Secondary | ICD-10-CM

## 2018-10-13 DIAGNOSIS — J4541 Moderate persistent asthma with (acute) exacerbation: Secondary | ICD-10-CM

## 2018-10-13 DIAGNOSIS — E1159 Type 2 diabetes mellitus with other circulatory complications: Secondary | ICD-10-CM | POA: Diagnosis not present

## 2018-10-13 DIAGNOSIS — F430 Acute stress reaction: Secondary | ICD-10-CM

## 2018-10-13 DIAGNOSIS — F4329 Adjustment disorder with other symptoms: Secondary | ICD-10-CM

## 2018-10-13 DIAGNOSIS — J45909 Unspecified asthma, uncomplicated: Secondary | ICD-10-CM

## 2018-10-13 DIAGNOSIS — E1169 Type 2 diabetes mellitus with other specified complication: Secondary | ICD-10-CM | POA: Diagnosis not present

## 2018-10-13 DIAGNOSIS — E669 Obesity, unspecified: Secondary | ICD-10-CM

## 2018-10-13 DIAGNOSIS — E782 Mixed hyperlipidemia: Secondary | ICD-10-CM

## 2018-10-13 DIAGNOSIS — I10 Essential (primary) hypertension: Secondary | ICD-10-CM

## 2018-10-13 DIAGNOSIS — R7989 Other specified abnormal findings of blood chemistry: Secondary | ICD-10-CM

## 2018-10-13 DIAGNOSIS — R4789 Other speech disturbances: Secondary | ICD-10-CM

## 2018-10-13 MED ORDER — ALBUTEROL SULFATE HFA 108 (90 BASE) MCG/ACT IN AERS: INHALATION_SPRAY | RESPIRATORY_TRACT | 1 refills | Status: DC

## 2018-10-13 MED ORDER — VITAMIN D (ERGOCALCIFEROL) 1.25 MG (50000 UNIT) PO CAPS: ORAL_CAPSULE | ORAL | 3 refills | Status: DC

## 2018-10-13 NOTE — Progress Notes (Signed)
Virtual Visit via Video Note for Mellody Dance, D.O- Primary Care Physician at [facility]   I connected with current patient today by telephone/ video and verified that I am speaking with the correct person using two identifiers.    This visit type was conducted due to national recommendations for restrictions regarding the COVID-19 Pandemic (e.g. social distancing) in an effort to limit this patient's exposure and mitigate transmission in our community.  Due to her co-morbid illnesses, this patient is at least at moderate risk for complications without adequate follow up.  This format is felt to be most appropriate for this patient at this time.  The patient did not have access to video technology/had technical difficulties with video requiring transitioning to audio format only (telephone).  No physical exam could be performed with this format, beyond that communicated to [place] by the patient/ family members as noted.  Additionally my staff members also discussed with the patient that there may be a patient charge related to this service.   The patient expressed understanding, and agreed to proceed.      History of Present Illness: -Patient was last seen on 09/28/2022 several concerns- most likely related to stress and excess worry.  She had 1 low blood pressure as well as feeling weak dizzy-patient had not eaten anything the night before or that day yet.  She also stated her appetite was not normal and she was not sleeping quite as good.  She was worrying a lot that she had a brain aneurysm like a family member of hers since she was having some occasional word finding difficulties.  The symptoms all came on after she was having a few days of a lot of excess worry and stress.    -At that office visit patient desired close follow-up and hence this is her 2-week follow-up.  Also recently patient had a full set of labs which we are going over today as well.    BP: Have now all been normal.   Patient runs in the 110s to low 193X systolically over 90W- low 80s.  She denies any symptoms she had prior of dizziness, weakness, lightheadedness etc. patient has just remained on losartan-hydrochlorothiazide   Stress:  she today admits to being extremely stressed lately, and she thinks that is what made her symptoms come on last office visit-and have her worry to excess-causing additional symptoms.    Luckily she has been purposely eating better, trying to exercise and doing meditation for sleep.  She also has a more regular diet and is not going long periods of time without eating.  She denies she is having any word finding problems and is not wearing she has something neurologically wrong with her like a brain aneurysm like a prior family member had.   Her energy levels have been better as well.  Her recent TSH is slightly elevated but a normal T3 and T4.   DM:   90-120 FBS on average.   2 hr PP- 116, 103.  Patient is currently taking metformin 500 mg once daily. Patient has a history of well controlled diabetes that was first diagnosed on 10/05/2017 with an A1c of 6.7.   Since then she has been working on diet and exercise-until couple months ago when the [place] thing hit and then she let the stress get to her.    Recent A1c was well controlled at 6.1.  It was almost a year ago at 6.7   Hyperlipidemia  associate with diabetes: Patient remains on Lipitor 40 mg.  LDL at goal    Recent Results (from the past 2160 hour(s))  VITAMIN D 25 Hydroxy (Vit-D Deficiency, Fractures)     Status: None   Collection Time: 10/10/18  9:42 AM  Result Value Ref Range   Vit D, 25-Hydroxy 60.9 30.0 - 100.0 ng/mL    Comment: Vitamin D deficiency has been defined by the [place] practice guideline as a level of serum 25-OH vitamin D less than 20 ng/mL (1,2). The Endocrine Society went on to further define vitamin D insufficiency as a level between 21 and 29 ng/mL (2). 1.  IOM (Institute of Medicine). 2010. Dietary reference    intakes for calcium and D. [place]: The    [employer]. 2. Holick MF, Binkley Arthur, Bischoff-Ferrari HA, et al.    Evaluation, treatment, and prevention of vitamin D    deficiency: an Endocrine Society clinical practice    guideline. JCEM. 2011 Jul; 96(7):1911-30.   TSH     Status: Abnormal   Collection Time: 10/10/18  9:42 AM  Result Value Ref Range   TSH 5.030 (H) 0.450 - 4.500 uIU/mL  T4, free     Status: None   Collection Time: 10/10/18  9:42 AM  Result Value Ref Range   Free T4 1.16 0.82 - 1.77 ng/dL  Lipid panel     Status: None   Collection Time: 10/10/18  9:42 AM  Result Value Ref Range   Cholesterol, Total 122 100 - 199 mg/dL   Triglycerides 66 0 - 149 mg/dL   HDL 53 >39 mg/dL   VLDL Cholesterol Cal 13 5 - 40 mg/dL   LDL Calculated 56 0 - 99 mg/dL   Chol/HDL Ratio 2.3 0.0 - 4.4 ratio    Comment:                                   T. Chol/HDL Ratio                                             Men  Women                               1/2 Avg.Risk  3.4    3.3                                   Avg.Risk  5.0    4.4                                2X Avg.Risk  9.6    7.1                                3X Avg.Risk 23.4   11.0   Hemoglobin A1c     Status: Abnormal   Collection Time: 10/10/18  9:42 AM  Result Value Ref Range   Hgb A1c MFr Bld 6.1 (H) 4.8 - 5.6 %    Comment:  Prediabetes: 5.7 - 6.4          Diabetes: >6.4          Glycemic control for adults with diabetes: <7.0    Est. average glucose Bld gHb Est-mCnc 128 mg/dL  Comprehensive metabolic panel     Status: Abnormal   Collection Time: 10/10/18  9:42 AM  Result Value Ref Range   Glucose 104 (H) 65 - 99 mg/dL   BUN 10 6 - 24 mg/dL   Creatinine, Ser 0.92 0.57 - 1.00 mg/dL   GFR calc non Af Amer 75 >59 mL/min/1.73   GFR calc Af Amer 86 >59 mL/min/1.73   BUN/Creatinine Ratio 11 9 - 23   Sodium 138 134 - 144 mmol/L   Potassium 3.9  3.5 - 5.2 mmol/L   Chloride 98 96 - 106 mmol/L   CO2 24 20 - 29 mmol/L   Calcium 9.2 8.7 - 10.2 mg/dL   Total Protein 6.7 6.0 - 8.5 g/dL   Albumin 4.4 3.8 - 4.8 g/dL   Globulin, Total 2.3 1.5 - 4.5 g/dL   Albumin/Globulin Ratio 1.9 1.2 - 2.2   Bilirubin Total 0.8 0.0 - 1.2 mg/dL   Alkaline Phosphatase 72 39 - 117 IU/L   AST 18 0 - 40 IU/L   ALT 16 0 - 32 IU/L  CBC with Differential/Platelet     Status: Abnormal   Collection Time: 10/10/18  9:42 AM  Result Value Ref Range   WBC 6.9 3.4 - 10.8 x10E3/uL   RBC 4.25 3.77 - 5.28 x10E6/uL   Hemoglobin 12.3 11.1 - 15.9 g/dL   Hematocrit 37.6 34.0 - 46.6 %   MCV 89 79 - 97 fL   MCH 28.9 26.6 - 33.0 pg   MCHC 32.7 31.5 - 35.7 g/dL   RDW 14.7 11.7 - 15.4 %   Platelets 210 150 - 450 x10E3/uL   Neutrophils 43 Not Estab. %   Lymphs 49 Not Estab. %   Monocytes 7 Not Estab. %   Eos 1 Not Estab. %   Basos 0 Not Estab. %   Neutrophils Absolute 2.9 1.4 - 7.0 x10E3/uL   Lymphocytes Absolute 3.4 (H) 0.7 - 3.1 x10E3/uL   Monocytes Absolute 0.5 0.1 - 0.9 x10E3/uL   EOS (ABSOLUTE) 0.0 0.0 - 0.4 x10E3/uL   Basophils Absolute 0.0 0.0 - 0.2 x10E3/uL   Immature Granulocytes 0 Not Estab. %   Immature Grans (Abs) 0.0 0.0 - 0.1 x10E3/uL  T3, free     Status: None   Collection Time: 10/10/18  9:42 AM  Result Value Ref Range   T3, Free 2.8 2.0 - 4.4 pg/mL  Specimen status report     Status: None   Collection Time: 10/10/18  9:42 AM  Result Value Ref Range   specimen status report Comment     Comment: Written Authorization Written Authorization Written Authorization Received. Authorization received from [place] 10-14-2018 Logged by Sheryle Hail      Wt Readings from Last 3 Encounters:  10/13/18 184 lb 9.6 oz (83.7 kg)  09/28/18 186 lb (84.4 kg)  07/14/18 184 lb (83.5 kg)    BP Readings from Last 3 Encounters:  10/13/18 128/90  09/28/18 116/89  07/17/18 (!) 148/99    Pulse Readings from Last 3 Encounters:  10/13/18 89    09/28/18 85  07/17/18 (!) 122    BMI Readings from Last 3 Encounters:  10/13/18 33.76 kg/m  09/28/18 34.02 kg/m  07/14/18 33.65 kg/m      -  Vitals obtained; Medications, allergies reconciled;  personal medical, social, Sx etc. etc. histories were updated by Lanier Prude the medical assistant today and are reflected in below chart   Patient Care Team    Relationship Specialty Notifications Start End  Mellody Dance, DO PCP - General Family Medicine  06/05/16      Patient Active Problem List   Diagnosis Date Noted   Type 2 diabetes mellitus with complication, without long-term current use of insulin ([place]) 10/12/2017    Priority: High   Hypertension associated with diabetes ([place]) 10/12/2017    Priority: High   Mixed diabetic hyperlipidemia associated with type 2 diabetes mellitus ([place]) 10/12/2017    Priority: High   Morbid obesity ([place]) 06/20/2016    Priority: High   Vaccination not carried out because of patient refusal 06/20/2016    Priority: Medium   Cough variant asthma 06/05/2016    Priority: Medium   Sleep apnea 06/05/2016    Priority: Medium   Heart murmur 06/05/2016    Priority: Medium   GERD (gastroesophageal reflux disease) 06/05/2016    Priority: Medium   Elevated TSH 10/12/2017    Priority: Low   Chronic pansinusitis 09/08/2017    Priority: Low   Vitamin D deficiency 06/19/2016    Priority: Low   Environmental and seasonal allergies 06/05/2016    Priority: Low   Stress and adjustment reaction 11/06/2018   Family history of aneurysm of blood vessel of brain 09/28/2018   Costochondritis-    L anterior S-C jts T2-6 07/14/2018   Acute right-sided thoracic back pain 06/24/2018   Obesity (BMI 30-39.9) 04/13/2018   Right foot pain 04/13/2018   ETD (Eustachian tube dysfunction), unspecified laterality 09/08/2017   Eczema 07/14/2017   Plantar fasciitis of right foot 11/24/2016   Screening for breast cancer 09/24/2016    Hypokalemia 09/15/2016   Sinus tachycardia 09/15/2016   Asthma exacerbation 09/14/2016   Encounter for annual routine gynecological examination 07/09/2016     Current Meds  Medication Sig   ACCU-CHEK GUIDE test strip TEST FASTING LEVEL IN MORNING AND 2 HOURS AFTER LARGEST MEAL EACH DAY   albuterol (PROAIR HFA) 108 (90 Base) MCG/ACT inhaler 1 PUFF INHALE EVERY 4-6 HOURS AS NEEDED (PRN)   AMBULATORY NON FORMULARY MEDICATION Single glucometer with lancets, test strips   atorvastatin (LIPITOR) 40 MG tablet TAKE 1 TABLET BY MOUTH EVERY DAY   blood glucose meter kit and supplies KIT Dispense based on patient and insurance preference. Use in AM to test fasting and 2 hours after largest meal each day.   cetirizine (ZYRTEC) 10 MG tablet Take 10 mg by mouth daily.   cyclobenzaprine (FLEXERIL) 10 MG tablet Take 1 tablet (10 mg total) by mouth 3 (three) times daily as needed for muscle spasms.   desoximetasone (TOPICORT) 0.25 % cream Apply 1 application topically 2 (two) times daily.   fluticasone (FLOVENT HFA) 220 MCG/ACT inhaler 2 puffs twice daily   ibuprofen (ADVIL,MOTRIN) 600 MG tablet Take 1 tablet (600 mg total) by mouth every 8 (eight) hours as needed.   losartan-hydrochlorothiazide (HYZAAR) 50-12.5 MG tablet TAKE 1 TABLET BY MOUTH EVERY DAY   metFORMIN (GLUCOPHAGE) 500 MG tablet TAKE 1 TABLET (500 MG TOTAL) BY MOUTH 2 (TWO) TIMES DAILY WITH A MEAL. (Patient taking differently: Take 500 mg by mouth daily. )   montelukast (SINGULAIR) 10 MG tablet TAKE 1 TABLET BY MOUTH EVERYDAY AT BEDTIME   ONETOUCH DELICA LANCETS FINE MISC USE ONE LANCET TO CHECK BLOOD SUGARS EVERY MORNING FASTING  AND 2 HOURS AFTER LARGEST MEAL.   Vitamin D, Ergocalciferol, (DRISDOL) 1.25 MG (50000 UT) CAPS capsule One tab Wed and one tab Sun   [DISCONTINUED] PROAIR HFA 108 (90 Base) MCG/ACT inhaler 1 PUFF INHALE EVERY 4-6 HOURS AS NEEDED (PRN)   [DISCONTINUED] Vitamin D, Ergocalciferol, (DRISDOL) 50000 units  CAPS capsule TAKE ONE CAPSULE BY MOUTH ONE TIME PER WEEK     Allergies:  Allergies  Allergen Reactions   Ultram [Tramadol Hcl] Other (See Comments)    syncope   Amoxicillin Hives and Swelling   Penicillins Hives and Swelling   Sulfa Antibiotics Hives   Peanut Oil Other (See Comments)    Unknown, from allergy testing   Sesame Oil Hives   Other Other (See Comments)    PEANUTS AND ALMONDS     ROS:  See above HPI for pertinent positives and negatives   Objective:   Blood pressure 128/90, pulse 89, temperature 97.9 F (36.6 C), height _0  (1.575 m), weight 184 lb 9.6 oz (83.7 kg), last menstrual period 09/14/2018. (if some vitals are omitted, this means that patient was UNABLE to obtain them even though asked to get them prior to OV today) General: sounds in no acute distress.  Skin: Pt confirms warm and dry  extremities and pink fingertips Respiratory: speaking in full sentences, no conversational dyspnea Psych: A and O *3, appears insight good, mood- full      Impression and Recommendations:      ICD-10-CM   1. Type 2 diabetes mellitus with complication, without long-term current use of insulin ([name]) E11.8   2. Hypertension associated with diabetes ([place]) E11.59    I10   3. Mixed diabetic hyperlipidemia associated with type 2 diabetes mellitus ([name]) E11.69    E78.2   4. Morbid conditions associated with her obesity ([place]) E66.01   5. Asthma without acute exacerbation J45.909 albuterol (PROAIR HFA) 108 (90 Base) MCG/ACT inhaler  6. h/o Elevated TSH R79.89   7. Vitamin D deficiency E55.9 Vitamin D, Ergocalciferol, (DRISDOL) 1.25 MG (50000 UT) CAPS capsule  8. Orthostatic lightheadedness R42   9. Word finding difficulty- episodic 1 mo or so R47.89   10. 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 Stress and adjustment reaction F43.29     1) A1c at goal.  Continue metformin 500 mg 1 p.o. daily.  Continue prudent diet, exercise and goal of  weight loss.  2) blood pressure is well controlled currently.  Patient on a ACE inhibitor along with hydrochlorothiazide.  No change in medications today if it bumps up into higher ranges in the future would be a good candidate for amlodipine.  3) continue on statin.  LDL at goal.  Prudent diet and exercise discussed  4) patient wishes to get back to the gym and tracking her foods etc.  However we set a goal for her to just become more physically active for now and she can start tracking on lose and/or my fitness pal  5) refill of albuterol given.  Continues on Singulair.  Symptoms controlled overall but tends to get worse in the allergy season  6) since patient continues to feel well and is in the sub-clinical hypothyroidism category, we will just continue to monitor.  No medication will be rendered  7) patient needs to take her vitamin D supplements which she has not been doing.  She needed a refill which we provided her with today  8 and 9)   symptoms resolved  10 and  11) patient is out of acute phase as her reaction to stress.  No longer with psychomotor abnormalities.  We discussed stress management techniques, meditation and/or prayer, exercise daily, good sleep hygiene, natural over-the-counter substances which may help etc  As part of my medical decision making, I reviewed the following data within the [place] History obtained from pt/family, CMA notes reviewed and incorporated, Labs reviewed, Radiograph/ tests reviewed if applicable and OV notes from prior OV's with me, as well as other specialists he has seen since seeing me last, were all reviewed and used in my medical decision making process today. Additionally, discussion had with patient regarding txmnt plan, their biases about that plan etc were used in my medical decision making today.  I discussed the assessment and treatment plan with the patient. The patient was provided an opportunity to ask questions and  all were answered.  The patient agreed with the plan and demonstrated an understanding of the instructions.   No barriers to understanding were identified.  Red flag symptoms and signs discussed in detail.  Patient expressed understanding regarding what to do in case of emergency\urgent symptoms   The patient was advised to call back or seek an in-person evaluation if the symptoms worsen or if the condition fails to improve as anticipated.   Return for Follow-up diabetes, blood pressure, cholesterol, weight loss 4 months; sooner if issues.    No orders of the defined types were placed in this encounter.   Meds ordered this encounter  Medications   albuterol (PROAIR HFA) 108 (90 Base) MCG/ACT inhaler    Sig: 1 PUFF INHALE EVERY 4-6 HOURS AS NEEDED (PRN)    Dispense:  1 Inhaler    Refill:  1    If using more often please schedule appointment.   Vitamin D, Ergocalciferol, (DRISDOL) 1.25 MG (50000 UT) CAPS capsule    Sig: One tab Wed and one tab Sun    Dispense:  24 capsule    Refill:  3    Medications Discontinued During This Encounter  Medication Reason   PROAIR HFA 108 (90 Base) MCG/ACT inhaler Reorder   Vitamin D, Ergocalciferol, (DRISDOL) 50000 units CAPS capsule Reorder     *Gross side effects, risk and benefits, and alternatives of medications and treatment plan in general discussed with patient.  Patient is aware that all medications have potential side effects and we are unable to predict every side effect or drug-drug interaction that may occur.   Patient was strongly encouraged to call with any questions or concerns they may have concerns.     I provided 30 minutes of face-to-face time during this encounter,with over 50% of the time in direct counseling on patients medical conditions/ concerns.  Additional time was spent with charting and coordination of care after the actual visit commenced.    Mellody Dance, DO

## 2018-10-15 LAB — SPECIMEN STATUS REPORT

## 2018-10-15 LAB — T3, FREE: T3, Free: 2.8 pg/mL (ref 2.0–4.4)

## 2018-11-06 DIAGNOSIS — F4329 Adjustment disorder with other symptoms: Secondary | ICD-10-CM | POA: Insufficient documentation

## 2018-12-19 ENCOUNTER — Other Ambulatory Visit: Payer: Self-pay | Admitting: Family Medicine

## 2019-02-04 ENCOUNTER — Other Ambulatory Visit: Payer: Self-pay | Admitting: Family Medicine

## 2019-02-04 DIAGNOSIS — E118 Type 2 diabetes mellitus with unspecified complications: Secondary | ICD-10-CM

## 2019-02-04 DIAGNOSIS — E1169 Type 2 diabetes mellitus with other specified complication: Secondary | ICD-10-CM

## 2019-03-06 ENCOUNTER — Other Ambulatory Visit: Payer: Self-pay | Admitting: Family Medicine

## 2019-03-06 DIAGNOSIS — E118 Type 2 diabetes mellitus with unspecified complications: Secondary | ICD-10-CM

## 2019-03-06 DIAGNOSIS — E1169 Type 2 diabetes mellitus with other specified complication: Secondary | ICD-10-CM

## 2019-03-20 ENCOUNTER — Other Ambulatory Visit: Payer: Self-pay | Admitting: Family Medicine

## 2019-03-20 DIAGNOSIS — E1169 Type 2 diabetes mellitus with other specified complication: Secondary | ICD-10-CM

## 2019-03-20 DIAGNOSIS — E118 Type 2 diabetes mellitus with unspecified complications: Secondary | ICD-10-CM

## 2019-03-20 DIAGNOSIS — E782 Mixed hyperlipidemia: Secondary | ICD-10-CM

## 2019-03-25 ENCOUNTER — Other Ambulatory Visit: Payer: Self-pay | Admitting: Family Medicine

## 2019-03-31 ENCOUNTER — Other Ambulatory Visit: Payer: Self-pay | Admitting: Family Medicine

## 2019-03-31 DIAGNOSIS — E118 Type 2 diabetes mellitus with unspecified complications: Secondary | ICD-10-CM

## 2019-04-25 ENCOUNTER — Telehealth: Payer: Self-pay | Admitting: Family Medicine

## 2019-04-25 ENCOUNTER — Other Ambulatory Visit: Payer: Self-pay | Admitting: Family Medicine

## 2019-04-25 DIAGNOSIS — E118 Type 2 diabetes mellitus with unspecified complications: Secondary | ICD-10-CM

## 2019-04-25 NOTE — Telephone Encounter (Signed)
Can you please contact patient to schedule office visit with [facility] for diabetes. Per her last visit not in April 2020 she was to follow up in 4 months which would have been August. She can not receive any further refills until she is seen in office. Thank you- AS, CMA

## 2019-04-25 NOTE — Telephone Encounter (Signed)
Called pt and left VM about needing med f/u for additional refills and to contact our office

## 2019-04-25 NOTE — Telephone Encounter (Signed)
Patient not allowed any further refills until apt in office.

## 2019-05-20 ENCOUNTER — Other Ambulatory Visit: Payer: Self-pay | Admitting: Family Medicine

## 2019-05-20 DIAGNOSIS — E118 Type 2 diabetes mellitus with unspecified complications: Secondary | ICD-10-CM

## 2019-07-10 ENCOUNTER — Other Ambulatory Visit: Payer: Self-pay | Admitting: Family Medicine

## 2019-07-10 DIAGNOSIS — M940 Chondrocostal junction syndrome [Tietze]: Secondary | ICD-10-CM

## 2019-07-10 MED ORDER — LOSARTAN POTASSIUM-HCTZ 50-12.5 MG PO TABS: 1.0000 | ORAL_TABLET | Freq: Every day | ORAL | 0 refills | Status: DC

## 2019-10-02 ENCOUNTER — Other Ambulatory Visit: Payer: Self-pay | Admitting: Family Medicine

## 2019-10-02 ENCOUNTER — Telehealth: Payer: Self-pay

## 2019-10-02 NOTE — Telephone Encounter (Signed)
Please call pt to schedule appt.  No further refills until pt is seen.  T. Aarionna Germer, CMA  

## 2019-11-09 ENCOUNTER — Other Ambulatory Visit: Payer: Self-pay | Admitting: Family Medicine

## 2019-11-09 NOTE — Progress Notes (Signed)
Established Patient Office Visit  Subjective:  Patient ID: Paula Lambert, female    DOB: Nov 08, 1970  Age: 49 y.o. MRN: [id number]  CC:  Chief Complaint  Patient presents with  . Hyperlipidemia  . Hypertension  . Diabetes  . Weight Loss    HPI Paula Lambert presents for chronic follow-up on diabetes, hypertension, hyperlipidemia, and seasonal allergies.  Diabetes: Pt denies increased urination or thirst. Pt reports medication compliance. Rare hypoglycemic events. States if she feels poor and it's due to low glucose, she'll eat something right away and feels better. Checking glucose at home. FBS range 110-120.   HTN: Pt denies new onset chest pain or dizziness. She has chest flutters, which happen rarely. She had a cardiac work-up few years ago including Holter monitor and no significant abnormalities revealed, except for "leaky valve". Taking medication as directed without side effects. Checks BP at home occassionally and readings range in 12/80.   HLD: Pt self discontinued Atorvastatin due to side effects. Reports she noticed having trouble with word articulation which improved once she stopped the medication.  Seasonal allergies, asthma: Well controlled with current med regimen. Requests refill of Provent.   Low back pain: Takes flexeril for occasional flare-ups which helps with pain relief but feels drowsy the next day.   R axillary tenderness: Pt reports she received her Covid-19 vaccine Wednesday (2 days ago) and now has tenderness under her L axilla and feels chest tightness along left side. Denies breast pain, discharge, or redness.  No family hx of breast cancer.   Past Medical History:  Diagnosis Date  . Asthma   . GERD (gastroesophageal reflux disease)   . Heart murmur    leaking valve  . Hypertension   . Sleep apnea     Past Surgical History:  Procedure Laterality Date  . BREAST SURGERY     reduction  . LIPOMA EXCISION    . TENDON RELEASE Right    elbow  .  TONSILLECTOMY      Family History  Problem Relation Age of Onset  . Aneurysm Mother   . Stroke Father   . Hypertension Father   . Prostate cancer Father   . Diabetes Father   . Heart attack Father   . Aneurysm Maternal Uncle   . Stroke Maternal Grandmother   . Hypertension Maternal Grandmother   . Heart disease Maternal Grandmother   . Diabetes Maternal Grandmother   . Aneurysm Maternal Grandfather   . Stroke Paternal Grandmother     Social History   Socioeconomic History  . Marital status: Married    Spouse name: Not on file  . Number of children: Not on file  . Years of education: Not on file  . Highest education level: Not on file  Occupational History  . Not on file  Tobacco Use  . Smoking status: Never Smoker  . Smokeless tobacco: Never Used  Substance and Sexual Activity  . Alcohol use: Yes    Comment: occasionally  . Drug use: No  . Sexual activity: Not on file  Other Topics Concern  . Not on file  Social History Narrative  . Not on file   Social Determinants of Health   Financial Resource Strain:   . Difficulty of Paying Living Expenses:   Food Insecurity:   . Worried About Charity fundraiser in the Last Year:   . Arboriculturist in the Last Year:   Transportation Needs:   . Film/video editor (Medical):   Marland Kitchen  Lack of Transportation (Non-Medical):   Physical Activity:   . Days of Exercise per Week:   . Minutes of Exercise per Session:   Stress:   . Feeling of Stress :   Social Connections:   . Frequency of Communication with Friends and Family:   . Frequency of Social Gatherings with Friends and Family:   . Attends Religious Services:   . Active Member of Clubs or Organizations:   . Attends Archivist Meetings:   Marland Kitchen Marital Status:   Intimate Partner Violence:   . Fear of Current or Ex-Partner:   . Emotionally Abused:   Marland Kitchen Physically Abused:   . Sexually Abused:     Outpatient Medications Prior to Visit  Medication Sig  Dispense Refill  . ACCU-CHEK GUIDE test strip TEST FASTING LEVEL IN MORNING AND 2 HOURS AFTER LARGEST MEAL EACH DAY 100 each 1  . albuterol (PROAIR HFA) 108 (90 Base) MCG/ACT inhaler 1 PUFF INHALE EVERY 4-6 HOURS AS NEEDED (PRN) 1 Inhaler 1  . AMBULATORY NON FORMULARY MEDICATION Single glucometer with lancets, test strips 1 each 0  . blood glucose meter kit and supplies KIT Dispense based on patient and insurance preference. Use in AM to test fasting and 2 hours after largest meal each day. 1 each 0  . cetirizine (ZYRTEC) 10 MG tablet Take 10 mg by mouth daily.    Marland Kitchen desoximetasone (TOPICORT) 0.25 % cream Apply 1 application topically 2 (two) times daily. 30 g 0  . ibuprofen (ADVIL,MOTRIN) 600 MG tablet Take 1 tablet (600 mg total) by mouth every 8 (eight) hours as needed. 30 tablet 0  . losartan-hydrochlorothiazide (HYZAAR) 50-12.5 MG tablet TAKE 1 TABLET BY MOUTH EVERY DAY 15 tablet 0  . metFORMIN (GLUCOPHAGE) 500 MG tablet TAKE 1 TABLET DAILY WITH BREAKFAST. NO FURTHER REFILLS UNTIL PATIENT IS SEEN IN OFFICE 15 tablet 0  . ONETOUCH DELICA LANCETS FINE MISC USE ONE LANCET TO CHECK BLOOD SUGARS EVERY MORNING FASTING AND 2 HOURS AFTER LARGEST MEAL. 100 each 4  . Vitamin D, Ergocalciferol, (DRISDOL) 1.25 MG (50000 UT) CAPS capsule One tab Wed and one tab Sun 24 capsule 3  . cyclobenzaprine (FLEXERIL) 10 MG tablet Take 1 tablet (10 mg total) by mouth 3 (three) times daily as needed for muscle spasms. 30 tablet 0  . fluticasone (FLOVENT HFA) 220 MCG/ACT inhaler 2 puffs twice daily 1 Inhaler 12  . atorvastatin (LIPITOR) 40 MG tablet TAKE 1 TABLET BY MOUTH DAILY. PATIENT MUST HAVE OFFICE VISIT PRIOR TO ANY FURTHER REFILLS 15 tablet 0  . montelukast (SINGULAIR) 10 MG tablet TAKE 1 TABLET BY MOUTH EVERYDAY AT BEDTIME 90 tablet 1   No facility-administered medications prior to visit.    Allergies  Allergen Reactions  . Ultram [Tramadol Hcl] Other (See Comments)    syncope  . Amoxicillin Hives and  Swelling  . Atorvastatin Other (See Comments)    Speech difficulities  . Penicillins Hives and Swelling  . Sulfa Antibiotics Hives  . Peanut Oil Other (See Comments)    Unknown, from allergy testing  . Sesame Oil Hives  . Other Other (See Comments)    PEANUTS AND ALMONDS    ROS Review of Systems  A fourteen system review of systems was performed and found to be positive as per HPI.   Objective:    Physical Exam General:  Well Developed, well nourished, appropriate for stated age.  Neuro:  Alert and oriented,  extra-ocular muscles intact  HEENT:  Normocephalic, atraumatic, neck  supple, no carotid bruits appreciated  Breast: Left axillary tenderness. No masses or discharge noted.  Skin:  no gross rash, warm, pink. Cardiac:  RRR, S1 S2, murmur Respiratory:  ECTA B/L and A/P, Not using accessory muscles, speaking in full sentences- unlabored. Vascular:  Ext warm, no cyanosis apprec.; cap RF less 2 sec. Mild peripheral edema noted bilaterally.  Psych:  No HI/SI, judgement and insight good, Euthymic mood. Full Affect.  BP 130/82   Pulse 94   Temp 98.5 F (36.9 C) (Oral)   Ht 5' 2" (1.575 m)   Wt 191 lb 11.2 oz (87 kg)   LMP 10/31/2019 (Approximate)   SpO2 97% Comment: on RA  BMI 35.06 kg/m  Wt Readings from Last 3 Encounters:  11/10/19 191 lb 11.2 oz (87 kg)  10/13/18 184 lb 9.6 oz (83.7 kg)  09/28/18 186 lb (84.4 kg)     Health Maintenance Due  Topic Date Due  . PNEUMOCOCCAL POLYSACCHARIDE VACCINE AGE 22-64 HIGH RISK  Never done  . OPHTHALMOLOGY EXAM  Never done  . COVID-19 Vaccine (1) Never done  . TETANUS/TDAP  Never done  . PAP SMEAR-Modifier  09/21/2019    There are no preventive care reminders to display for this patient.  Lab Results  Component Value Date   TSH 5.030 (H) 10/10/2018   Lab Results  Component Value Date   WBC 6.9 10/10/2018   HGB 12.3 10/10/2018   HCT 37.6 10/10/2018   MCV 89 10/10/2018   PLT 210 10/10/2018   Lab Results  Component  Value Date   NA 138 10/10/2018   K 3.9 10/10/2018   CO2 24 10/10/2018   GLUCOSE 104 (H) 10/10/2018   BUN 10 10/10/2018   CREATININE 0.92 10/10/2018   BILITOT 0.8 10/10/2018   ALKPHOS 72 10/10/2018   AST 18 10/10/2018   ALT 16 10/10/2018   PROT 6.7 10/10/2018   ALBUMIN 4.4 10/10/2018   CALCIUM 9.2 10/10/2018   ANIONGAP 10 09/16/2016   Lab Results  Component Value Date   CHOL 122 10/10/2018   Lab Results  Component Value Date   HDL 53 10/10/2018   Lab Results  Component Value Date   LDLCALC 56 10/10/2018   Lab Results  Component Value Date   TRIG 66 10/10/2018   Lab Results  Component Value Date   CHOLHDL 2.3 10/10/2018   Lab Results  Component Value Date   HGBA1C 6.2 (A) 11/10/2019      Assessment & Plan:   Problem List Items Addressed This Visit      Cardiovascular and Mediastinum   Hypertension associated with diabetes ([place])     Endocrine   Type 2 diabetes mellitus with complication, without long-term current use of insulin ([name]) - Primary   Relevant Orders   POCT glycosylated hemoglobin (Hb A1C) (Completed)   POCT UA - Microalbumin (Completed)   Mixed diabetic hyperlipidemia associated with type 2 diabetes mellitus ([name])     Other   Environmental and seasonal allergies (Chronic)    Other Visit Diagnoses    Low back pain, unspecified back pain laterality, unspecified chronicity, unspecified whether sciatica present       Relevant Medications   baclofen (LIORESAL) 10 MG tablet   Asthma without acute exacerbation       Relevant Medications   fluticasone (FLOVENT HFA) 220 MCG/ACT inhaler   Axillary adenopathy          Meds ordered this encounter  Medications  . fluticasone (FLOVENT HFA) 220 MCG/ACT inhaler  Sig: 2 puffs twice daily    Dispense:  1 Inhaler    Refill:  12  . baclofen (LIORESAL) 10 MG tablet    Sig: Take 1 tablet (10 mg total) by mouth 2 (two) times daily as needed for muscle spasms.    Dispense:  60 each    Refill:  0     Order Specific Question:   Supervising Provider    Answer:   Beatrice Lecher D [[id number]]   Type 2 Diabetes Mellitus: - A1c today is 6.2, stable - Continue Metformin -Continue ambulatory glucose monitoring and notify clinic if FBS consistently <80 or >160. - Encourage low glucose/carbohydrate diet and increase physical activity. - Pt will schedule annual diabetic eye exam.  HTN: - BP today is 130/82, stable. - Continue Hyzaar. - Continue ambulatory BP and pulse monitoring. - Encourage DASH diet. - Stay well hydrated- at lest 64 fl oz  Mixed diabetic HLD: - Last lipid panel wnl's. - Encourage to follow heart healthy diet and increase physical activity.  - Will recheck lipid panel at next OV and discussed with patient alternatives to Atorvastatin if statin therapy indicated.   Environmental and seasonal allergies, Asthma: - Continue current med regimen. Provided refill of Provent.  Low back pain: - Discussed with patient alternative muscle relaxer with less side effects and pt agreeable to trying Baclofen.   Left axillary adenopathy: - Discussed with patient most like side effect of vaccine. No red flag signs or symptoms present. Advised to notify clinic if symptoms don't improve in 1 week and will order diagnostic mammogram for further evaluation. Pt verbalized understanding.       Follow-up: Return for CPE and FBW in 2-3 months.    Lorrene Reid, PA-C

## 2019-11-10 ENCOUNTER — Other Ambulatory Visit: Payer: Self-pay

## 2019-11-10 ENCOUNTER — Encounter: Payer: Self-pay | Admitting: Physician Assistant

## 2019-11-10 ENCOUNTER — Ambulatory Visit (INDEPENDENT_AMBULATORY_CARE_PROVIDER_SITE_OTHER): Payer: Managed Care, Other (non HMO) | Admitting: Physician Assistant

## 2019-11-10 VITALS — BP 130/82 | HR 94 | Temp 98.5°F | Ht 62.0 in | Wt 191.7 lb

## 2019-11-10 DIAGNOSIS — E118 Type 2 diabetes mellitus with unspecified complications: Secondary | ICD-10-CM | POA: Diagnosis not present

## 2019-11-10 DIAGNOSIS — R590 Localized enlarged lymph nodes: Secondary | ICD-10-CM

## 2019-11-10 DIAGNOSIS — E1169 Type 2 diabetes mellitus with other specified complication: Secondary | ICD-10-CM

## 2019-11-10 DIAGNOSIS — E1159 Type 2 diabetes mellitus with other circulatory complications: Secondary | ICD-10-CM

## 2019-11-10 DIAGNOSIS — J45909 Unspecified asthma, uncomplicated: Secondary | ICD-10-CM

## 2019-11-10 DIAGNOSIS — J3089 Other allergic rhinitis: Secondary | ICD-10-CM | POA: Diagnosis not present

## 2019-11-10 DIAGNOSIS — I10 Essential (primary) hypertension: Secondary | ICD-10-CM

## 2019-11-10 DIAGNOSIS — M545 Low back pain, unspecified: Secondary | ICD-10-CM

## 2019-11-10 DIAGNOSIS — E782 Mixed hyperlipidemia: Secondary | ICD-10-CM

## 2019-11-10 LAB — POCT UA - MICROALBUMIN
Albumin/Creatinine Ratio, Urine, POC: <30
Creatinine, POC: 300 mg/dL
Microalbumin Ur, POC: 10 mg/L

## 2019-11-10 LAB — POCT GLYCOSYLATED HEMOGLOBIN (HGB A1C): Hemoglobin A1C: 6.2 % — AB (ref 4.0–5.6)

## 2019-11-10 MED ORDER — BACLOFEN 10 MG PO TABS: 10.0000 mg | ORAL_TABLET | Freq: Two times a day (BID) | ORAL | 0 refills | Status: DC | PRN

## 2019-11-10 MED ORDER — FLOVENT HFA 220 MCG/ACT IN AERO: INHALATION_SPRAY | RESPIRATORY_TRACT | 12 refills | Status: DC

## 2019-11-10 NOTE — Patient Instructions (Signed)

## 2019-12-07 ENCOUNTER — Other Ambulatory Visit: Payer: Self-pay | Admitting: Physician Assistant

## 2019-12-07 DIAGNOSIS — M545 Low back pain, unspecified: Secondary | ICD-10-CM

## 2019-12-21 ENCOUNTER — Other Ambulatory Visit: Payer: Self-pay | Admitting: Family Medicine

## 2019-12-26 MED ORDER — LOSARTAN POTASSIUM-HCTZ 50-12.5 MG PO TABS: 1.0000 | ORAL_TABLET | Freq: Every day | ORAL | 0 refills | Status: DC

## 2020-01-30 ENCOUNTER — Other Ambulatory Visit: Payer: Self-pay

## 2020-01-30 DIAGNOSIS — R7989 Other specified abnormal findings of blood chemistry: Secondary | ICD-10-CM

## 2020-01-30 DIAGNOSIS — I152 Hypertension secondary to endocrine disorders: Secondary | ICD-10-CM

## 2020-01-30 DIAGNOSIS — Z0000 Encounter for general adult medical examination without abnormal findings: Secondary | ICD-10-CM

## 2020-01-30 DIAGNOSIS — E1169 Type 2 diabetes mellitus with other specified complication: Secondary | ICD-10-CM

## 2020-01-30 DIAGNOSIS — E876 Hypokalemia: Secondary | ICD-10-CM

## 2020-01-30 DIAGNOSIS — E118 Type 2 diabetes mellitus with unspecified complications: Secondary | ICD-10-CM

## 2020-01-31 ENCOUNTER — Other Ambulatory Visit: Payer: Self-pay

## 2020-01-31 ENCOUNTER — Other Ambulatory Visit: Payer: Managed Care, Other (non HMO)

## 2020-01-31 DIAGNOSIS — R7989 Other specified abnormal findings of blood chemistry: Secondary | ICD-10-CM

## 2020-01-31 DIAGNOSIS — E1159 Type 2 diabetes mellitus with other circulatory complications: Secondary | ICD-10-CM

## 2020-01-31 DIAGNOSIS — E118 Type 2 diabetes mellitus with unspecified complications: Secondary | ICD-10-CM

## 2020-01-31 DIAGNOSIS — E876 Hypokalemia: Secondary | ICD-10-CM

## 2020-01-31 DIAGNOSIS — Z0000 Encounter for general adult medical examination without abnormal findings: Secondary | ICD-10-CM

## 2020-01-31 DIAGNOSIS — E1169 Type 2 diabetes mellitus with other specified complication: Secondary | ICD-10-CM

## 2020-02-01 LAB — COMPREHENSIVE METABOLIC PANEL
ALT: 14 IU/L (ref 0–32)
AST: 14 IU/L (ref 0–40)
Albumin/Globulin Ratio: 1.6 (ref 1.2–2.2)
Albumin: 4.2 g/dL (ref 3.8–4.8)
Alkaline Phosphatase: 74 IU/L (ref 48–121)
BUN/Creatinine Ratio: 14 (ref 9–23)
BUN: 12 mg/dL (ref 6–24)
Bilirubin Total: 0.3 mg/dL (ref 0.0–1.2)
CO2: 25 mmol/L (ref 20–29)
Calcium: 9.3 mg/dL (ref 8.7–10.2)
Chloride: 98 mmol/L (ref 96–106)
Creatinine, Ser: 0.88 mg/dL (ref 0.57–1.00)
GFR calc Af Amer: 90 mL/min/{1.73_m2} (ref >59)
GFR calc non Af Amer: 78 mL/min/{1.73_m2} (ref >59)
Globulin, Total: 2.7 g/dL (ref 1.5–4.5)
Glucose: 93 mg/dL (ref 65–99)
Potassium: 4 mmol/L (ref 3.5–5.2)
Sodium: 137 mmol/L (ref 134–144)
Total Protein: 6.9 g/dL (ref 6.0–8.5)

## 2020-02-01 LAB — CBC WITH DIFFERENTIAL/PLATELET
Basophils Absolute: 0 10*3/uL (ref 0.0–0.2)
Basos: 0 %
EOS (ABSOLUTE): 0 10*3/uL (ref 0.0–0.4)
Eos: 1 %
Hematocrit: 37.4 % (ref 34.0–46.6)
Hemoglobin: 12.2 g/dL (ref 11.1–15.9)
Immature Grans (Abs): 0 10*3/uL (ref 0.0–0.1)
Immature Granulocytes: 0 %
Lymphocytes Absolute: 3.4 10*3/uL — ABNORMAL HIGH (ref 0.7–3.1)
Lymphs: 44 %
MCH: 28.6 pg (ref 26.6–33.0)
MCHC: 32.6 g/dL (ref 31.5–35.7)
MCV: 88 fL (ref 79–97)
Monocytes Absolute: 0.6 10*3/uL (ref 0.1–0.9)
Monocytes: 8 %
Neutrophils Absolute: 3.6 10*3/uL (ref 1.4–7.0)
Neutrophils: 47 %
Platelets: 181 10*3/uL (ref 150–450)
RBC: 4.26 x10E6/uL (ref 3.77–5.28)
RDW: 14.2 % (ref 11.7–15.4)
WBC: 7.7 10*3/uL (ref 3.4–10.8)

## 2020-02-01 LAB — LIPID PANEL
Chol/HDL Ratio: 3.5 ratio (ref 0.0–4.4)
Cholesterol, Total: 204 mg/dL — ABNORMAL HIGH (ref 100–199)
HDL: 59 mg/dL (ref >39)
LDL Chol Calc (NIH): 127 mg/dL — ABNORMAL HIGH (ref 0–99)
Triglycerides: 98 mg/dL (ref 0–149)
VLDL Cholesterol Cal: 18 mg/dL (ref 5–40)

## 2020-02-01 LAB — TSH: TSH: 4.42 u[IU]/mL (ref 0.450–4.500)

## 2020-02-01 LAB — HEMOGLOBIN A1C
Est. average glucose Bld gHb Est-mCnc: 126 mg/dL
Hgb A1c MFr Bld: 6 % — ABNORMAL HIGH (ref 4.8–5.6)

## 2020-02-02 ENCOUNTER — Encounter: Payer: Self-pay | Admitting: Gastroenterology

## 2020-02-02 ENCOUNTER — Encounter: Payer: Self-pay | Admitting: Physician Assistant

## 2020-02-02 ENCOUNTER — Ambulatory Visit (INDEPENDENT_AMBULATORY_CARE_PROVIDER_SITE_OTHER): Payer: Managed Care, Other (non HMO) | Admitting: Physician Assistant

## 2020-02-02 ENCOUNTER — Other Ambulatory Visit: Payer: Self-pay

## 2020-02-02 VITALS — BP 130/81 | HR 88 | Temp 97.6°F | Ht 62.0 in | Wt 192.3 lb

## 2020-02-02 DIAGNOSIS — Z1231 Encounter for screening mammogram for malignant neoplasm of breast: Secondary | ICD-10-CM

## 2020-02-02 DIAGNOSIS — Z23 Encounter for immunization: Secondary | ICD-10-CM | POA: Diagnosis not present

## 2020-02-02 DIAGNOSIS — Z0000 Encounter for general adult medical examination without abnormal findings: Secondary | ICD-10-CM | POA: Diagnosis not present

## 2020-02-02 DIAGNOSIS — Z1211 Encounter for screening for malignant neoplasm of colon: Secondary | ICD-10-CM | POA: Diagnosis not present

## 2020-02-02 NOTE — Patient Instructions (Signed)

## 2020-02-02 NOTE — Progress Notes (Signed)
Female Physical   Impression and Recommendations:    No diagnosis found.   1) Anticipatory Guidance: Discussed skin CA prevention and sunscreen when outside along with skin surveillance; eating a balanced and modest diet; physical activity at least 25 minutes per day or minimum of 150 min/ week moderate to intense activity.  2) Immunizations / Screenings / Labs:   All immunizations are up-to-date per recommendations or will be updated today if pt allows.    - Patient understands with dental and vision screens they will schedule independently.  -Obtained CBC, CMP, HgA1c, Lipid panel, TSH and vit D when fasting, if not already done past 12 mo/ recently.  Most labs were essentially within normal limits or stable from prior.  Lipid panel increased from prior. -Placed orders for screening mammogram, colonoscopy, and Tdap. -Patient declined hep C and HIV screening.  3) Weight:  BMI meaning discussed with patient.  Discussed goal to improve diet habits to improve overall feelings of well being and objective health data. Improve nutrient density of diet through increasing intake of fruits and vegetables and decreasing saturated fats, white flour products and refined sugars.  4) Healthcare maintenance: -Continue current medication regimen. -Follow a heart healthy diet and continue to monitor carbohydrates and glucose. -Increase physical activity level. -Schedule annual diabetic eye exam. -Follow-up in 4 months for regular OV: DM, HTN, HLD, and repeat lipid panel, A1c, CMP   No orders of the defined types were placed in this encounter.   No orders of the defined types were placed in this encounter.    No follow-ups on file.      Gross side effects, risk and benefits, and alternatives of medications discussed with patient.  Patient is aware that all medications have potential side effects and we are unable to predict every side effect or drug-drug interaction that may occur.  Expresses  verbal understanding and consents to current therapy plan and treatment regimen.  F-up preventative CPE in 1 year- reminded pt again, this is in addition to any chronic care visits.    Please see orders placed and AVS handed out to patient at the end of our visit for further patient instructions/ counseling done pertaining to today's office visit.  Note:  This note was prepared with assistance of Dragon voice recognition software. Occasional wrong-word or sound-a-like substitutions may have occurred due to the inherent limitations of voice recognition software.   Subjective:     CPE HPI: Paula Lambert is a 49 y.o. female who presents to [place] [facility] today for a yearly health maintenance exam.   Health Maintenance Summary  - Reviewed and updated, unless pt declines services.  Last Cologuard or Colonoscopy:   Placed order for screening colonoscopy. Family history of Colon CA:  No Tobacco History Reviewed:  Y, never smoker Alcohol and/or drug use:  No concerns; no use Dental Home: N Eye exams: Y Dermatology home: N  Female Health:  PAP Smear - last known results: Requesting most recent Pap from OB[facility] STD concrns:   none Breast Cancer Family History: No    Immunization History  Administered Date(s) Administered  . Influenza,inj,Quad PF,6+ Mos 04/09/2016, 07/14/2017, 07/14/2018  . Influenza,inj,quad, With Preservative 04/09/2016     Health Maintenance  Topic Date Due  . Hepatitis C Screening  Never done  . PNEUMOCOCCAL POLYSACCHARIDE VACCINE AGE 103-64 HIGH RISK  Never done  . OPHTHALMOLOGY EXAM  Never done  . COVID-19 Vaccine (1) Never done  .  TETANUS/TDAP  Never done  . PAP SMEAR-Modifier  09/21/2019  . INFLUENZA VACCINE  01/21/2020  . HEMOGLOBIN A1C  08/02/2020  . FOOT EXAM  11/09/2020  . HIV Screening  Completed     Wt Readings from Last 3 Encounters:  02/02/20 192 lb 4.8 oz (87.2 kg)  11/10/19 191 lb  11.2 oz (87 kg)  10/13/18 184 lb 9.6 oz (83.7 kg)   BP Readings from Last 3 Encounters:  02/02/20 130/81  11/10/19 130/82  10/13/18 128/90   Pulse Readings from Last 3 Encounters:  02/02/20 88  11/10/19 94  10/13/18 89     Past Medical History:  Diagnosis Date  . Asthma   . GERD (gastroesophageal reflux disease)   . Heart murmur    leaking valve  . Hypertension   . Sleep apnea       Past Surgical History:  Procedure Laterality Date  . BREAST SURGERY     reduction  . LIPOMA EXCISION    . TENDON RELEASE Right    elbow  . TONSILLECTOMY        Family History  Problem Relation Age of Onset  . Aneurysm Mother   . Stroke Father   . Hypertension Father   . Prostate cancer Father   . Diabetes Father   . Heart attack Father   . Aneurysm Maternal Uncle   . Stroke Maternal Grandmother   . Hypertension Maternal Grandmother   . Heart disease Maternal Grandmother   . Diabetes Maternal Grandmother   . Aneurysm Maternal Grandfather   . Stroke Paternal Grandmother       Social History   Substance and Sexual Activity  Drug Use No  ,   Social History   Substance and Sexual Activity  Alcohol Use Yes   Comment: occasionally  ,   Social History   Tobacco Use  Smoking Status Never Smoker  Smokeless Tobacco Never Used  ,   Social History   Substance and Sexual Activity  Sexual Activity Not on file    Current Outpatient Medications on File Prior to Visit  Medication Sig Dispense Refill  . ACCU-CHEK GUIDE test strip TEST FASTING LEVEL IN MORNING AND 2 HOURS AFTER LARGEST MEAL EACH DAY 100 each 1  . albuterol (PROAIR HFA) 108 (90 Base) MCG/ACT inhaler 1 PUFF INHALE EVERY 4-6 HOURS AS NEEDED (PRN) 1 Inhaler 1  . AMBULATORY NON FORMULARY MEDICATION Single glucometer with lancets, test strips 1 each 0  . baclofen (LIORESAL) 10 MG tablet Take 1 tablet (10 mg total) by mouth 2 (two) times daily as needed for muscle spasms. 60 each 0  . blood glucose meter kit  and supplies KIT Dispense based on patient and insurance preference. Use in AM to test fasting and 2 hours after largest meal each day. 1 each 0  . cetirizine (ZYRTEC) 10 MG tablet Take 10 mg by mouth daily.    Marland Kitchen desoximetasone (TOPICORT) 0.25 % cream Apply 1 application topically 2 (two) times daily. 30 g 0  . fluticasone (FLOVENT HFA) 220 MCG/ACT inhaler 2 puffs twice daily 1 Inhaler 12  . ibuprofen (ADVIL,MOTRIN) 600 MG tablet Take 1 tablet (600 mg total) by mouth every 8 (eight) hours as needed. 30 tablet 0  . losartan-hydrochlorothiazide (HYZAAR) 50-12.5 MG tablet Take 1 tablet by mouth daily. 90 tablet 0  . metFORMIN (GLUCOPHAGE) 500 MG tablet TAKE 1 TABLET DAILY WITH BREAKFAST. NO FURTHER REFILLS UNTIL PATIENT IS SEEN IN OFFICE 15 tablet 0  . ONETOUCH DELICA LANCETS  FINE MISC USE ONE LANCET TO CHECK BLOOD SUGARS EVERY MORNING FASTING AND 2 HOURS AFTER LARGEST MEAL. 100 each 4  . Vitamin D, Ergocalciferol, (DRISDOL) 1.25 MG (50000 UT) CAPS capsule One tab Wed and one tab Sun 24 capsule 3   No current facility-administered medications on file prior to visit.    Allergies: Ultram [tramadol hcl], Amoxicillin, Atorvastatin, Penicillins, Sulfa antibiotics, Peanut oil, Sesame oil, and Other  Review of Systems: General:   Denies fever, chills, unexplained weight loss.  Optho/Auditory:   Denies visual changes, blurred vision/LOV Respiratory:   Denies SOB, DOE more than baseline levels.   Cardiovascular:   Denies chest pain, palpitations, new onset peripheral edema  Gastrointestinal:   Denies nausea, vomiting, diarrhea.  Genitourinary: Denies dysuria, freq/ urgency, flank pain or discharge from genitals.  Endocrine:     Denies hot or cold intolerance, polyuria, polydipsia. Musculoskeletal:   Denies unexplained myalgias, joint swelling, unexplained arthralgias, gait problems.  Skin:  Denies rash, suspicious lesions Neurological:     Denies dizziness, unexplained weakness, numbness   Psychiatric/Behavioral:   Denies mood changes, suicidal or homicidal ideations, hallucinations    Objective:    Blood pressure 130/81, pulse 88, temperature 97.6 F (36.4 C), temperature source Oral, height '5\' 2"'$  (1.575 m), weight 192 lb 4.8 oz (87.2 kg), SpO2 98 %. Body mass index is 35.17 kg/m. General Appearance:    Alert, cooperative, no distress, appears stated age  Head:    Normocephalic, without obvious abnormality, atraumatic  Eyes:    PERRL, conjunctiva/corneas clear, EOM's intact, both eyes  Ears:    Normal TM's and external ear canals, both ears  Nose:   Nares normal, septum midline, mucosa normal, no drainage    or sinus tenderness  Throat:   Lips w/o lesion, mucosa moist, and tongue normal; teeth and   gums normal  Neck:   Supple, symmetrical, trachea midline, no adenopathy;    thyroid:  no enlargement/tenderness/nodules; no carotid   bruit or JVD  Back:     Symmetric, no curvature, ROM normal, no CVA tenderness  Lungs:     Clear to auscultation bilaterally, respirations unlabored, no       Wh/ R/ R  Chest Wall:    No tenderness or gross deformity; normal excursion   Heart:    Regular rate and rhythm, S1 and S2 normal, no murmur, rub   or gallop  Breast Exam:   Dense breast tissue present, asymmetry noted (history of breast reduction); no nipple discharge  Abdomen:     Soft, non-tender, bowel sounds active all four quadrants, No   G/R/R, no masses, no organomegaly  Genitalia:   Deferred to OB/GYN.  Rectal:   Deferred to OB/GYN.  Extremities:   Extremities normal, atraumatic, no cyanosis or gross edema  Pulses:   2+ and symmetric all extremities  Skin:   Warm, dry, Skin color, texture, turgor normal, no obvious rashes or lesions Psych: No HI/SI, judgement and insight good, Euthymic mood. Full Affect.  Neurologic:   CNII-XII intact, normal strength, sensation and reflexes    Throughout

## 2020-02-09 ENCOUNTER — Ambulatory Visit
Admission: RE | Admit: 2020-02-09 | Discharge: 2020-02-09 | Disposition: A | Discharge status: Home / Self Care | Payer: Managed Care, Other (non HMO) | Source: Ambulatory Visit | Attending: Physician Assistant | Admitting: Physician Assistant

## 2020-02-09 ENCOUNTER — Other Ambulatory Visit: Payer: Self-pay

## 2020-02-09 DIAGNOSIS — Z1231 Encounter for screening mammogram for malignant neoplasm of breast: Secondary | ICD-10-CM

## 2020-02-13 ENCOUNTER — Other Ambulatory Visit: Payer: Self-pay | Admitting: Physician Assistant

## 2020-02-13 DIAGNOSIS — R928 Other abnormal and inconclusive findings on diagnostic imaging of breast: Secondary | ICD-10-CM

## 2020-02-17 ENCOUNTER — Encounter (HOSPITAL_COMMUNITY): Payer: Self-pay

## 2020-02-17 ENCOUNTER — Other Ambulatory Visit: Payer: Self-pay

## 2020-02-17 ENCOUNTER — Emergency Department (HOSPITAL_COMMUNITY)
Admission: EM | Admit: 2020-02-17 | Discharge: 2020-02-18 | Disposition: A | Discharge status: Home / Self Care | Payer: Managed Care, Other (non HMO) | Attending: Emergency Medicine | Admitting: Emergency Medicine

## 2020-02-17 DIAGNOSIS — Z91010 Allergy to peanuts: Secondary | ICD-10-CM | POA: Diagnosis not present

## 2020-02-17 DIAGNOSIS — R519 Headache, unspecified: Secondary | ICD-10-CM | POA: Diagnosis present

## 2020-02-17 DIAGNOSIS — I10 Essential (primary) hypertension: Secondary | ICD-10-CM | POA: Insufficient documentation

## 2020-02-17 DIAGNOSIS — U071 COVID-19: Secondary | ICD-10-CM | POA: Diagnosis not present

## 2020-02-17 DIAGNOSIS — Z79899 Other long term (current) drug therapy: Secondary | ICD-10-CM | POA: Insufficient documentation

## 2020-02-17 DIAGNOSIS — J45909 Unspecified asthma, uncomplicated: Secondary | ICD-10-CM | POA: Diagnosis not present

## 2020-02-17 DIAGNOSIS — E119 Type 2 diabetes mellitus without complications: Secondary | ICD-10-CM | POA: Insufficient documentation

## 2020-02-17 LAB — SARS CORONAVIRUS 2 BY RT PCR (HOSPITAL ORDER, PERFORMED IN ~~LOC~~ HOSPITAL LAB): SARS Coronavirus 2: POSITIVE — AB

## 2020-02-17 NOTE — ED Triage Notes (Signed)
Pt states that she took at home test yesterday for covid yesterday and it was positive and she wants to confirm it, pt has symptoms for fatigue, low grade fevers, cough, headache

## 2020-02-18 ENCOUNTER — Emergency Department (HOSPITAL_COMMUNITY): Payer: Managed Care, Other (non HMO)

## 2020-02-18 LAB — TROPONIN I (HIGH SENSITIVITY)
Troponin I (High Sensitivity): <2 ng/L (ref <18)
Troponin I (High Sensitivity): <2 ng/L (ref <18)

## 2020-02-18 MED ORDER — SODIUM CHLORIDE 0.9 % IV SOLN: INTRAVENOUS | Status: DC | PRN

## 2020-02-18 MED ORDER — ALBUTEROL SULFATE HFA 108 (90 BASE) MCG/ACT IN AERS: 2.0000 | INHALATION_SPRAY | Freq: Once | RESPIRATORY_TRACT | Status: DC | PRN

## 2020-02-18 MED ORDER — FAMOTIDINE IN NACL 20-0.9 MG/50ML-% IV SOLN: 20.0000 mg | Freq: Once | INTRAVENOUS | Status: DC | PRN

## 2020-02-18 MED ORDER — SODIUM CHLORIDE 0.9 % IV SOLN
1200.0000 mg | Freq: Once | INTRAVENOUS | Status: AC
  Administered 2020-02-18: 1200 mg via INTRAVENOUS
  Filled 2020-02-18: qty 10

## 2020-02-18 MED ORDER — METHYLPREDNISOLONE SODIUM SUCC 125 MG IJ SOLR: 125.0000 mg | Freq: Once | INTRAMUSCULAR | Status: DC | PRN

## 2020-02-18 MED ORDER — DIPHENHYDRAMINE HCL 50 MG/ML IJ SOLN: 50.0000 mg | Freq: Once | INTRAMUSCULAR | Status: DC | PRN

## 2020-02-18 MED ORDER — EPINEPHRINE 0.3 MG/0.3ML IJ SOAJ: 0.3000 mg | Freq: Once | INTRAMUSCULAR | Status: DC | PRN

## 2020-02-18 NOTE — Discharge Instructions (Signed)
You were seen today for COVID-19 infection.  Make sure that you are staying hydrated.  You were treated with monoclonal antibody.  By a portable pulse ox and return if your oxygen saturations drop below 92%.  Take Tylenol or Motrin for any pain or discomfort or fevers. 

## 2020-02-18 NOTE — ED Provider Notes (Signed)
[facility] EMERGENCY DEPARTMENT Provider Note   CSN: [id number] Arrival date & time: 02/17/20  2018     History Chief Complaint  Patient presents with  . Covid    Paula Lambert is a 49 y.o. female.  HPI     This 49 year old female with a history of asthma and hypertension who presents with headache, fatigue, myalgias, fever.  She reports symptoms over the last 1 to 2 days.  She states her husband has similar symptoms.  They both took home Covid tests that were positive.  Patient initially requesting confirmation test.  However, she also states that she had some palpitations and fluttering yesterday and now has some anterior chest discomfort that is nonradiating.  Not exertional.  Currently 4 out of 10.  It is not pleuritic.  She denies shortness of breath but has had some cough.  No asymmetric leg swelling.  She is fully vaccinated.  Past Medical History:  Diagnosis Date  . Asthma   . GERD (gastroesophageal reflux disease)   . Heart murmur    leaking valve  . Hypertension   . Sleep apnea     Patient Active Problem List   Diagnosis Date Noted  . Stress and adjustment reaction 11/06/2018  . Family history of aneurysm of blood vessel of brain 09/28/2018  . Costochondritis-    L anterior S-C jts T2-6 07/14/2018  . Acute right-sided thoracic back pain 06/24/2018  . Obesity (BMI 30-39.9) 04/13/2018  . Right foot pain 04/13/2018  . Type 2 diabetes mellitus with complication, without long-term current use of insulin ([place]) 10/12/2017  . Hypertension associated with diabetes ([place]) 10/12/2017  . Mixed diabetic hyperlipidemia associated with type 2 diabetes mellitus ([place]) 10/12/2017  . Elevated TSH 10/12/2017  . Chronic pansinusitis 09/08/2017  . ETD (Eustachian tube dysfunction), unspecified laterality 09/08/2017  . Eczema 07/14/2017  . Plantar fasciitis of right foot 11/24/2016  . Screening for breast cancer 09/24/2016  . Hypokalemia 09/15/2016  . Sinus  tachycardia 09/15/2016  . Asthma exacerbation 09/14/2016  . Encounter for annual routine gynecological examination 07/09/2016  . Vaccination not carried out because of patient refusal 06/20/2016  . Morbid obesity ([place]) 06/20/2016  . Vitamin D deficiency 06/19/2016  . Cough variant asthma 06/05/2016  . Sleep apnea 06/05/2016  . Heart murmur 06/05/2016  . GERD (gastroesophageal reflux disease) 06/05/2016  . Environmental and seasonal allergies 06/05/2016    Past Surgical History:  Procedure Laterality Date  . BREAST SURGERY     reduction  . LIPOMA EXCISION    . TENDON RELEASE Right    elbow  . TONSILLECTOMY       OB History   No obstetric history on file.     Family History  Problem Relation Age of Onset  . Aneurysm Mother   . Stroke Father   . Hypertension Father   . Prostate cancer Father   . Diabetes Father   . Heart attack Father   . Aneurysm Maternal Uncle   . Stroke Maternal Grandmother   . Hypertension Maternal Grandmother   . Heart disease Maternal Grandmother   . Diabetes Maternal Grandmother   . Aneurysm Maternal Grandfather   . Stroke Paternal Grandmother     Social History   Tobacco Use  . Smoking status: Never Smoker  . Smokeless tobacco: Never Used  Vaping Use  . Vaping Use: Never used  Substance Use Topics  . Alcohol use: Yes    Comment: occasionally  . Drug use: No  Home Medications Prior to Admission medications   Medication Sig Start Date End Date Taking? Authorizing Provider  ACCU-CHEK GUIDE test strip TEST FASTING LEVEL IN MORNING AND 2 HOURS AFTER LARGEST MEAL EACH DAY 12/03/17   Mellody Dance, DO  albuterol (PROAIR HFA) 108 (90 Base) MCG/ACT inhaler 1 PUFF INHALE EVERY 4-6 HOURS AS NEEDED (PRN) 10/13/18   Opalski, Neoma Laming, DO  AMBULATORY NON FORMULARY MEDICATION Single glucometer with lancets, test strips 10/12/17   Opalski, Deborah, DO  baclofen (LIORESAL) 10 MG tablet Take 1 tablet (10 mg total) by mouth 2 (two) times daily as  needed for muscle spasms. 11/10/19   Lorrene Reid, PA-C  blood glucose meter kit and supplies KIT Dispense based on patient and insurance preference. Use in AM to test fasting and 2 hours after largest meal each day. 10/12/17   Opalski, Neoma Laming, DO  cetirizine (ZYRTEC) 10 MG tablet Take 10 mg by mouth daily.    [provider]  desoximetasone (TOPICORT) 0.25 % cream Apply 1 application topically 2 (two) times daily. 07/14/17   Mellody Dance, DO  fluticasone (FLOVENT HFA) 220 MCG/ACT inhaler 2 puffs twice daily 11/10/19   Abonza, Maritza, PA-C  ibuprofen (ADVIL,MOTRIN) 600 MG tablet Take 1 tablet (600 mg total) by mouth every 8 (eight) hours as needed. 07/14/18   Opalski, Neoma Laming, DO  losartan-hydrochlorothiazide (HYZAAR) 50-12.5 MG tablet Take 1 tablet by mouth daily. 12/26/19   Lorrene Reid, PA-C  metFORMIN (GLUCOPHAGE) 500 MG tablet TAKE 1 TABLET DAILY WITH BREAKFAST. NO FURTHER REFILLS UNTIL PATIENT IS SEEN IN OFFICE 04/25/19   Opalski, Neoma Laming, DO  ONETOUCH DELICA LANCETS FINE MISC USE ONE LANCET TO CHECK BLOOD SUGARS EVERY MORNING FASTING AND 2 HOURS AFTER LARGEST MEAL. 08/30/17   Mellody Dance, DO  Vitamin D, Ergocalciferol, (DRISDOL) 1.25 MG (50000 UT) CAPS capsule One tab Wed and one tab Sun 10/13/18   Opalski, Neoma Laming, DO    Allergies    Ultram [place] Mode hcl], Amoxicillin, Atorvastatin, Penicillins, Sulfa antibiotics, Peanut oil, Sesame oil, and Other  Review of Systems   Review of Systems  Constitutional: Positive for fatigue and fever.  Respiratory: Positive for cough. Negative for shortness of breath.   Cardiovascular: Positive for chest pain. Negative for leg swelling.  Gastrointestinal: Negative for abdominal pain, nausea and vomiting.  Neurological: Positive for headaches.  All other systems reviewed and are negative.   Physical Exam Updated Vital Signs BP (!) 146/95   Pulse 78   Temp 98.9 F (37.2 C) (Oral)   Resp (!) 21   LMP  (LMP Unknown)   SpO2 97%    Physical Exam Vitals and nursing note reviewed.  Constitutional:      Appearance: She is well-developed. She is not ill-appearing.     Comments: Overweight, not overweight, nontoxic-appearing  HENT:     Head: Normocephalic and atraumatic.     Nose: Nose normal.     Mouth/Throat:     Mouth: Mucous membranes are moist.  Eyes:     Pupils: Pupils are equal, round, and reactive to light.  Cardiovascular:     Rate and Rhythm: Normal rate and regular rhythm.     Heart sounds: Normal heart sounds.  Pulmonary:     Effort: Pulmonary effort is normal. No respiratory distress.     Breath sounds: No wheezing.  Abdominal:     General: Bowel sounds are normal.     Palpations: Abdomen is soft.  Musculoskeletal:     Cervical back: Neck supple.  Right lower leg: No edema.     Left lower leg: No edema.  Skin:    General: Skin is warm and dry.  Neurological:     Mental Status: She is alert and oriented to person, place, and time.  Psychiatric:        Mood and Affect: Mood normal.     ED Results / Procedures / Treatments   Labs (all labs ordered are listed, but only abnormal results are displayed) Labs Reviewed  SARS CORONAVIRUS 2 BY RT PCR (HOSPITAL ORDER, [place] [facility]) - Abnormal; Notable for the following components:      Result Value   SARS Coronavirus 2 POSITIVE (*)    All other components within normal limits  TROPONIN I (HIGH SENSITIVITY)  TROPONIN I (HIGH SENSITIVITY)    EKG EKG Interpretation  Date/Time:  Sunday February 18 2020 03:56:34 EDT Ventricular Rate:  81 PR Interval:    QRS Duration: 68 QT Interval:  348 QTC Calculation: 404 R Axis:   66 Text Interpretation: Sinus rhythm Confirmed by Thayer Jew [id number]) on 02/18/2020 5:59:16 AM   Radiology DG Chest Portable 1 View  Result Date: 02/18/2020 CLINICAL DATA:  Positive home COVID test. Low-grade fevers, cough, headache, and fatigue. EXAM: PORTABLE CHEST 1 VIEW COMPARISON:   09/14/2016 FINDINGS: The heart size and mediastinal contours are within normal limits. Both lungs are clear. The visualized skeletal structures are unremarkable. IMPRESSION: No active disease. Electronically Signed   By: Kerby Moors M.D.   On: 02/18/2020 05:19    Procedures Procedures (including critical care time)  Medications Ordered in ED Medications  0.9 %  sodium chloride infusion (has no administration in time range)  diphenhydrAMINE (BENADRYL) injection 50 mg (has no administration in time range)  famotidine (PEPCID) IVPB 20 mg premix (has no administration in time range)  methylPREDNISolone sodium succinate (SOLU-MEDROL) 125 mg/2 mL injection 125 mg (has no administration in time range)  albuterol (VENTOLIN HFA) 108 (90 Base) MCG/ACT inhaler 2 puff (has no administration in time range)  EPINEPHrine (EPI-PEN) injection 0.3 mg (has no administration in time range)  casirivimab-imdevimab (REGEN-COV) 1,200 mg in sodium chloride 0.9 % 110 mL IVPB (0 mg Intravenous Stopped 02/18/20 1324)    ED Course  I have reviewed the triage vital signs and the nursing notes.  Pertinent labs & imaging results that were available during my care of the patient were reviewed by me and considered in my medical decision making (see chart for details).    MDM Rules/Calculators/A&P                          This a 49 year old female who presents with Covid symptoms.  Positive Covid test at home.  Confirmed here.  She has upper respiratory symptoms.  She is overall nontoxic and vital signs are reassuring.  She is not hypoxic.  Regarding her chest pain, EKG was obtained and shows no evidence of acute ischemia.  Chest x-ray shows no evidence of pneumothorax or pneumonia.  Troponin x1 -.  Suspect her symptoms are related to Covid.  Given that she is not hypoxic and chest pain is not pleuritic, have lower suspicion for PE.  Patient was offered and given a monoclonal antibody infusion.  She tolerated this  well.   Final Clinical Impression(s) / ED Diagnoses Final diagnoses:  [id number]    Rx / DC Orders ED Discharge Orders    None  Merryl Hacker, MD 02/18/20 [id number]

## 2020-02-21 ENCOUNTER — Other Ambulatory Visit: Payer: Managed Care, Other (non HMO)

## 2020-02-27 ENCOUNTER — Ambulatory Visit
Admission: EM | Admit: 2020-02-27 | Discharge: 2020-02-27 | Disposition: A | Discharge status: Home / Self Care | Payer: Managed Care, Other (non HMO) | Attending: Emergency Medicine | Admitting: Emergency Medicine

## 2020-02-27 ENCOUNTER — Other Ambulatory Visit: Payer: Self-pay

## 2020-02-27 ENCOUNTER — Telehealth: Payer: Self-pay | Admitting: Physician Assistant

## 2020-02-27 ENCOUNTER — Other Ambulatory Visit: Payer: Self-pay | Admitting: Physician Assistant

## 2020-02-27 ENCOUNTER — Encounter: Payer: Self-pay | Admitting: Emergency Medicine

## 2020-02-27 DIAGNOSIS — J0100 Acute maxillary sinusitis, unspecified: Secondary | ICD-10-CM

## 2020-02-27 MED ORDER — FLUTICASONE PROPIONATE 50 MCG/ACT NA SUSP: 1.0000 | Freq: Every day | NASAL | 0 refills | Status: AC

## 2020-02-27 MED ORDER — PREDNISONE 20 MG PO TABS: 20.0000 mg | ORAL_TABLET | Freq: Every day | ORAL | 0 refills | Status: DC

## 2020-02-27 NOTE — ED Triage Notes (Signed)
Pt here for continued HA after being diagnosed with covid on 8/28

## 2020-02-27 NOTE — Telephone Encounter (Signed)
Patient states she tested positive for covid 10 days ago but is still sick. Patient states she has a lot of nasal congestion and ear pain. She has been taking OTC medication (tylenol cold and flu) but is not helping. I advised patient to go to urgent care for evaluation for secondary infection-possible ear infection. Patient verbalized understanding and was agreeable to go. Patient denies SOB or difficulty breathing and is aware to wear a mask and let UC know she is positive for covid. AS< CMA

## 2020-02-27 NOTE — ED Provider Notes (Signed)
[facility]    CSN: [id number] Arrival date & time: 02/27/20  1249      History   Chief Complaint Chief Complaint  Patient presents with  . Headache    HPI Paula Lambert is a 49 y.o. female  Presenting for persistent sinus pressure s/p COVID-19 infection.  States she was diagnosed 8/28: Has noted improvement in symptoms, though still in sinus pressure.  Taking Tylenol/DayQuil with mild relief.  Does take allergy medication daily.  No intranasal steroid spray.  Denies dental pain, ear pain, fever.  Past Medical History:  Diagnosis Date  . Asthma   . GERD (gastroesophageal reflux disease)   . Heart murmur    leaking valve  . Hypertension   . Sleep apnea     Patient Active Problem List   Diagnosis Date Noted  . Stress and adjustment reaction 11/06/2018  . Family history of aneurysm of blood vessel of brain 09/28/2018  . Costochondritis-    L anterior S-C jts T2-6 07/14/2018  . Acute right-sided thoracic back pain 06/24/2018  . Obesity (BMI 30-39.9) 04/13/2018  . Right foot pain 04/13/2018  . Type 2 diabetes mellitus with complication, without long-term current use of insulin ([place]) 10/12/2017  . Hypertension associated with diabetes ([place]) 10/12/2017  . Mixed diabetic hyperlipidemia associated with type 2 diabetes mellitus ([place]) 10/12/2017  . Elevated TSH 10/12/2017  . Chronic pansinusitis 09/08/2017  . ETD (Eustachian tube dysfunction), unspecified laterality 09/08/2017  . Eczema 07/14/2017  . Plantar fasciitis of right foot 11/24/2016  . Screening for breast cancer 09/24/2016  . Hypokalemia 09/15/2016  . Sinus tachycardia 09/15/2016  . Asthma exacerbation 09/14/2016  . Encounter for annual routine gynecological examination 07/09/2016  . Vaccination not carried out because of patient refusal 06/20/2016  . Morbid obesity ([place]) 06/20/2016  . Vitamin D deficiency 06/19/2016  . Cough variant asthma 06/05/2016  . Sleep apnea 06/05/2016  . Heart murmur  06/05/2016  . GERD (gastroesophageal reflux disease) 06/05/2016  . Environmental and seasonal allergies 06/05/2016    Past Surgical History:  Procedure Laterality Date  . BREAST SURGERY     reduction  . LIPOMA EXCISION    . TENDON RELEASE Right    elbow  . TONSILLECTOMY      OB History   No obstetric history on file.      Home Medications    Prior to Admission medications   Medication Sig Start Date End Date Taking? Authorizing Provider  ACCU-CHEK GUIDE test strip TEST FASTING LEVEL IN MORNING AND 2 HOURS AFTER LARGEST MEAL EACH DAY 12/03/17   Mellody Dance, DO  albuterol (PROAIR HFA) 108 (90 Base) MCG/ACT inhaler 1 PUFF INHALE EVERY 4-6 HOURS AS NEEDED (PRN) 10/13/18   Opalski, Neoma Laming, DO  AMBULATORY NON FORMULARY MEDICATION Single glucometer with lancets, test strips 10/12/17   Opalski, Deborah, DO  baclofen (LIORESAL) 10 MG tablet Take 1 tablet (10 mg total) by mouth 2 (two) times daily as needed for muscle spasms. 11/10/19   Lorrene Reid, PA-C  blood glucose meter kit and supplies KIT Dispense based on patient and insurance preference. Use in AM to test fasting and 2 hours after largest meal each day. 10/12/17   Opalski, Neoma Laming, DO  cetirizine (ZYRTEC) 10 MG tablet Take 10 mg by mouth daily.    [provider]  desoximetasone (TOPICORT) 0.25 % cream Apply 1 application topically 2 (two) times daily. 07/14/17   Opalski, Deborah, DO  fluticasone (FLONASE) 50 MCG/ACT nasal spray Place 1 spray into both  nostrils daily. 02/27/20   Hall-Potvin, [place], PA-C  fluticasone (FLOVENT HFA) 220 MCG/ACT inhaler 2 puffs twice daily 11/10/19   Abonza, Maritza, PA-C  ibuprofen (ADVIL,MOTRIN) 600 MG tablet Take 1 tablet (600 mg total) by mouth every 8 (eight) hours as needed. 07/14/18   Opalski, Neoma Laming, DO  losartan-hydrochlorothiazide (HYZAAR) 50-12.5 MG tablet Take 1 tablet by mouth daily. 12/26/19   Lorrene Reid, PA-C  metFORMIN (GLUCOPHAGE) 500 MG tablet TAKE 1 TABLET DAILY WITH  BREAKFAST. NO FURTHER REFILLS UNTIL PATIENT IS SEEN IN OFFICE 04/25/19   Opalski, Neoma Laming, DO  ONETOUCH DELICA LANCETS FINE MISC USE ONE LANCET TO CHECK BLOOD SUGARS EVERY MORNING FASTING AND 2 HOURS AFTER LARGEST MEAL. 08/30/17   Opalski, Neoma Laming, DO  predniSONE (DELTASONE) 20 MG tablet Take 1 tablet (20 mg total) by mouth daily. 02/27/20   Hall-Potvin, [place], PA-C  Vitamin D, Ergocalciferol, (DRISDOL) 1.25 MG (50000 UT) CAPS capsule One tab Wed and one tab Sun 10/13/18   Mellody Dance, DO    Family History Family History  Problem Relation Age of Onset  . Aneurysm Mother   . Stroke Father   . Hypertension Father   . Prostate cancer Father   . Diabetes Father   . Heart attack Father   . Aneurysm Maternal Uncle   . Stroke Maternal Grandmother   . Hypertension Maternal Grandmother   . Heart disease Maternal Grandmother   . Diabetes Maternal Grandmother   . Aneurysm Maternal Grandfather   . Stroke Paternal Grandmother     Social History Social History   Tobacco Use  . Smoking status: Never Smoker  . Smokeless tobacco: Never Used  Vaping Use  . Vaping Use: Never used  Substance Use Topics  . Alcohol use: Yes    Comment: occasionally  . Drug use: No     Allergies   Ultram [tramadol hcl], Amoxicillin, Atorvastatin, Penicillins, Sulfa antibiotics, Peanut oil, Sesame oil, and Other   Review of Systems As per HPI   Physical Exam Triage Vital Signs ED Triage Vitals  Enc Vitals Group     BP      Pulse      Resp      Temp      Temp src      SpO2      Weight      Height      Head Circumference      Peak Flow      Pain Score      Pain Loc      Pain Edu?      Excl. in [place]?    No data found.  Updated Vital Signs BP (!) 144/83 (BP Location: Right Arm)   Pulse 97   Temp 98.5 F (36.9 C) (Oral)   Resp 18   LMP  (LMP Unknown)   SpO2 97%   Visual Acuity Right Eye Distance:   Left Eye Distance:   Bilateral Distance:    Right Eye Near:   Left Eye Near:      Bilateral Near:     Physical Exam Constitutional:      General: She is not in acute distress. HENT:     Head: Normocephalic and atraumatic.     Jaw: There is normal jaw occlusion. No tenderness or pain on movement.     Right Ear: Hearing, tympanic membrane, ear canal and external ear normal. No tenderness. No mastoid tenderness.     Left Ear: Hearing, tympanic membrane, ear canal and external ear normal. No  tenderness. No mastoid tenderness.     Nose: No nasal deformity, septal deviation or nasal tenderness.     Right Turbinates: Not swollen or pale.     Left Turbinates: Not swollen or pale.     Right Sinus: No maxillary sinus tenderness or frontal sinus tenderness.     Left Sinus: No maxillary sinus tenderness or frontal sinus tenderness.     Comments: Bilateral turbinate edema without mucus plug.  Negative sinus tenderness bilaterally    Mouth/Throat:     Lips: Pink. No lesions.     Mouth: Mucous membranes are moist. No injury.     Pharynx: Oropharynx is clear. Uvula midline. No posterior oropharyngeal erythema or uvula swelling.     Comments: no tonsillar exudate or hypertrophy Eyes:     General: No scleral icterus.    Conjunctiva/sclera: Conjunctivae normal.     Pupils: Pupils are equal, round, and reactive to light.  Cardiovascular:     Rate and Rhythm: Normal rate.  Pulmonary:     Effort: Pulmonary effort is normal.  Musculoskeletal:     Cervical back: Normal range of motion and neck supple. No muscular tenderness.  Lymphadenopathy:     Cervical: No cervical adenopathy.  Neurological:     Mental Status: She is alert and oriented to person, place, and time.      UC Treatments / Results  Labs (all labs ordered are listed, but only abnormal results are displayed) Labs Reviewed - No data to display  EKG   Radiology No results found.  Procedures Procedures (including critical care time)  Medications Ordered in UC Medications - No data to display  Initial  Impression / Assessment and Plan / UC Course  I have reviewed the triage vital signs and the nursing notes.  Pertinent labs & imaging results that were available during my care of the patient were reviewed by me and considered in my medical decision making (see chart for details).     Patient febrile, nontoxic, and greater than 10 days s/p COVID-19 infection.  Likely inflammatory/viral process: We will start supportive care as outlined below today.  Discussed signs/symptoms to prompt repeat evaluation for possible ABS including, but not limited to, second worsening.  Return precautions discussed, pt verbalized understanding and is agreeable to plan. Final Clinical Impressions(s) / UC Diagnoses   Final diagnoses:  Acute maxillary sinusitis, recurrence not specified     Discharge Instructions     Start flonase, atrovent nasal spray for nasal congestion/drainage. You can use over the counter nasal saline rinse such as neti pot for nasal congestion. Keep hydrated, your urine should be clear to pale yellow in color. Tylenol/motrin for fever and pain. Monitor for any worsening of symptoms, chest pain, shortness of breath, wheezing, swelling of the throat, go to the emergency department for further evaluation needed.     ED Prescriptions    Medication Sig Dispense Auth. Provider   predniSONE (DELTASONE) 20 MG tablet Take 1 tablet (20 mg total) by mouth daily. 5 tablet Hall-Potvin, [place], PA-C   fluticasone (FLONASE) 50 MCG/ACT nasal spray Place 1 spray into both nostrils daily. 16 g Hall-Potvin, [place], PA-C     PDMP not reviewed this encounter.   Hall-Potvin, [place], [place] 02/27/20 1516

## 2020-02-27 NOTE — Discharge Instructions (Addendum)
Start flonase, atrovent nasal spray for nasal congestion/drainage. You can use over the counter nasal saline rinse such as neti pot for nasal congestion. Keep hydrated, your urine should be clear to pale yellow in color. Tylenol/motrin for fever and pain. Monitor for any worsening of symptoms, chest pain, shortness of breath, wheezing, swelling of the throat, go to the emergency department for further evaluation needed.  

## 2020-02-27 NOTE — Telephone Encounter (Signed)
Patient called and left VM, she is requesting a call back from the nurse to discuss some clinical questions about her recent COVID test that was positive, please contact when you can at [phone]

## 2020-03-04 ENCOUNTER — Telehealth: Payer: Self-pay | Admitting: Physician Assistant

## 2020-03-04 NOTE — Telephone Encounter (Signed)
Patient called and left VM asking to speak with Jake Samples about conversation and COVID Dx from last week. She has some follow up information and needs to speak with [facility] when available.

## 2020-03-04 NOTE — Telephone Encounter (Signed)
Patient requesting note to return to work after being out due to covid.  Note printed for patient and she will come pick up. AS, CMA

## 2020-03-14 ENCOUNTER — Other Ambulatory Visit: Payer: Self-pay

## 2020-03-14 ENCOUNTER — Ambulatory Visit (AMBULATORY_SURGERY_CENTER): Payer: Self-pay | Admitting: *Deleted

## 2020-03-14 VITALS — Ht 62.0 in | Wt 189.8 lb

## 2020-03-14 DIAGNOSIS — Z1211 Encounter for screening for malignant neoplasm of colon: Secondary | ICD-10-CM

## 2020-03-14 MED ORDER — SUTAB 1479-225-188 MG PO TABS: 1.0000 | ORAL_TABLET | ORAL | 0 refills | Status: DC

## 2020-03-14 NOTE — Progress Notes (Signed)
Patient denies any allergies to egg or soy products. Patient denies complications with anesthesia/sedation.  Patient denies oxygen use at home and denies diet medications. Emmi instructions for colonoscopy explained and sent via mychart.   

## 2020-03-20 ENCOUNTER — Other Ambulatory Visit: Payer: Self-pay

## 2020-03-20 ENCOUNTER — Other Ambulatory Visit: Payer: Managed Care, Other (non HMO)

## 2020-03-20 ENCOUNTER — Telehealth: Payer: Self-pay | Admitting: Physician Assistant

## 2020-03-20 DIAGNOSIS — M25529 Pain in unspecified elbow: Secondary | ICD-10-CM

## 2020-03-20 DIAGNOSIS — Z0000 Encounter for general adult medical examination without abnormal findings: Secondary | ICD-10-CM

## 2020-03-20 NOTE — Telephone Encounter (Signed)
Referral has been placed. AS, CMA 

## 2020-03-20 NOTE — Telephone Encounter (Signed)
Patient has been having chronic pain and mobility issues in her left elbow and is requesting a referral to ortho, please place referral if applicable

## 2020-03-20 NOTE — Addendum Note (Signed)
Addended by: Sylvester Harder on: 03/20/2020 10:28 AM   Modules accepted: Orders

## 2020-03-21 LAB — SARS-COV-2 ANTIBODY, IGM: SARS-CoV-2 Antibody, IgM: POSITIVE

## 2020-03-22 ENCOUNTER — Encounter: Payer: Self-pay | Admitting: Family Medicine

## 2020-03-22 ENCOUNTER — Other Ambulatory Visit: Payer: Self-pay

## 2020-03-22 ENCOUNTER — Ambulatory Visit: Payer: Self-pay

## 2020-03-22 ENCOUNTER — Ambulatory Visit (INDEPENDENT_AMBULATORY_CARE_PROVIDER_SITE_OTHER): Payer: Managed Care, Other (non HMO) | Admitting: Family Medicine

## 2020-03-22 DIAGNOSIS — M25522 Pain in left elbow: Secondary | ICD-10-CM | POA: Diagnosis not present

## 2020-03-22 DIAGNOSIS — M79645 Pain in left finger(s): Secondary | ICD-10-CM | POA: Diagnosis not present

## 2020-03-22 NOTE — Progress Notes (Signed)
Office Visit Note   Patient: Paula Lambert           Date of Birth: 1970-11-04           MRN: [id number] Visit Date: 03/22/2020 Requested by: Mayer Masker, PA-C [id number] [place],  [place] [address] PCP: Mayer Masker, PA-C  Subjective: Chief Complaint  Patient presents with   Left Elbow - Pain    Fell in July in [place] - fell forward with outstretched hands. Pain in the lateral elbow. Hurts to lift anything much with the left arm. Right-hand dominant. Works at [employer] all day.   Left Thumb - Pain    Pain in the thumb since the same fall in July. Hurts with opening containers, gripping objects, etc.    HPI: She is here with left elbow and thumb pain.  2 months ago while in [place] she tripped and fell forward catching herself with her outstretched arms.  Immediate pain on the lateral elbow and at the MCP joint of her thumb.  She did not require immediate treatment but her symptoms have not improved.  Her elbow bothers her the most.  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  Denies any numbness or tingling.  Her thumb hurts when she tries to open jars or do any twisting movements.  Her thumb pain is not as bad as her elbow.  She is right-hand dominant.  She works at a [employer].              ROS:   All other systems were reviewed and are negative.  Objective: Vital Signs: LMP 03/11/2020 (Exact Date)   Physical Exam:  General:  Alert and oriented, in no acute distress. Pulm:  Breathing unlabored. Psy:  Normal mood, congruent affect. Skin: No erythema Left elbow: Full range of motion, no effusion.  Point tender at the common extensor tendon at the lateral epicondyle and that reproduces her pain.  Severe pain with wrist extension and third finger extension against resistance. Left thumb: There is no laxity of the MCP collateral ligaments.  No joint effusion.  There is an occasional click with passive  range of motion of the MCP joint.  No pain with resisted strength testing.   Imaging: US Guided Needle Placement  Result Date: 03/22/2020 Limited diagnostic ultrasound of the left lateral elbow reveals a intrasubstance partial tear of the common extensor tendon.   Assessment & Plan: 1.  Left elbow pain 2 months status post fall with underlying partial tear of the common extensor tendon -Discussed options, she has a tennis elbow brace at home.  She will start occupational therapy at PT and hand.  If symptoms persist, could contemplate dextrose prolotherapy prior to surgical consultation.  2.  Left thumb MCP sprain -Therapy as above.  X-rays if symptoms persist.     Procedures: No procedures performed  No notes on file     PMFS History: Patient Active Problem List   Diagnosis Date Noted   Stress and adjustment reaction 11/06/2018   Family history of aneurysm of blood vessel of brain 09/28/2018   Costochondritis-    L anterior S-C jts T2-6 07/14/2018   Acute right-sided thoracic back pain 06/24/2018   Obesity (BMI 30-39.9) 04/13/2018   Right foot pain 04/13/2018   Type 2 diabetes mellitus with complication, without long-term current use of insulin ([name]) 10/12/2017   Hypertension associated with diabetes ([name]) 10/12/2017  Mixed diabetic hyperlipidemia associated with type 2 diabetes mellitus ([name]) 10/12/2017   Elevated TSH 10/12/2017   Chronic pansinusitis 09/08/2017   ETD (Eustachian tube dysfunction), unspecified laterality 09/08/2017   Eczema 07/14/2017   Plantar fasciitis of right foot 11/24/2016   Screening for breast cancer 09/24/2016   Hypokalemia 09/15/2016   Sinus tachycardia 09/15/2016   Asthma exacerbation 09/14/2016   Encounter for annual routine gynecological examination 07/09/2016   Vaccination not carried out because of patient refusal 06/20/2016   Morbid obesity ([name]) 06/20/2016   Vitamin D deficiency 06/19/2016   Cough variant  asthma 06/05/2016   Sleep apnea 06/05/2016   Heart murmur 06/05/2016   GERD (gastroesophageal reflux disease) 06/05/2016   Environmental and seasonal allergies 06/05/2016   Past Medical History:  Diagnosis Date   Allergy    Asthma    Depression    hx - not currently   Diabetes mellitus without complication ([name])    type 2   GERD (gastroesophageal reflux disease)    Heart murmur    leaking valve, never caused any problems   HSV infection    Hypertension    Sickle cell trait ([name])    Sleep apnea    does not use cpap, patient states "lost weight"    Family History  Problem Relation Age of Onset   Aneurysm Mother    Stroke Father    Hypertension Father    Prostate cancer Father    Diabetes Father    Heart attack Father    Aneurysm Maternal Uncle    Stroke Maternal Grandmother    Hypertension Maternal Grandmother    Heart disease Maternal Grandmother    Diabetes Maternal Grandmother    Aneurysm Maternal Grandfather    Stroke Paternal Grandmother    Stomach cancer Neg Hx    Rectal cancer Neg Hx    Colon cancer Neg Hx     Past Surgical History:  Procedure Laterality Date   BREAST SURGERY     reduction   COLONOSCOPY     LIPOMA EXCISION     TENDON RELEASE Right    elbow   TONSILLECTOMY     WISDOM TOOTH EXTRACTION     Social History   Occupational History   Not on file  Tobacco Use   Smoking status: Never Smoker   Smokeless tobacco: Never Used  Vaping Use   Vaping Use: Never used  Substance and Sexual Activity   Alcohol use: Yes    Comment: occasionally   Drug use: No   Sexual activity: Not Currently    Birth control/protection: None

## 2020-03-27 DIAGNOSIS — M7712 Lateral epicondylitis, left elbow: Secondary | ICD-10-CM | POA: Diagnosis not present

## 2020-03-27 DIAGNOSIS — M6281 Muscle weakness (generalized): Secondary | ICD-10-CM | POA: Diagnosis not present

## 2020-03-27 DIAGNOSIS — M25642 Stiffness of left hand, not elsewhere classified: Secondary | ICD-10-CM | POA: Diagnosis not present

## 2020-03-27 DIAGNOSIS — M25522 Pain in left elbow: Secondary | ICD-10-CM | POA: Diagnosis not present

## 2020-03-29 ENCOUNTER — Other Ambulatory Visit: Payer: Self-pay

## 2020-03-29 ENCOUNTER — Encounter: Payer: Self-pay | Admitting: Gastroenterology

## 2020-03-29 ENCOUNTER — Ambulatory Visit (AMBULATORY_SURGERY_CENTER): Payer: Managed Care, Other (non HMO) | Admitting: Gastroenterology

## 2020-03-29 VITALS — BP 136/87 | HR 88 | Temp 98.0°F | Resp 15 | Ht 62.0 in | Wt 189.0 lb

## 2020-03-29 DIAGNOSIS — Z1211 Encounter for screening for malignant neoplasm of colon: Secondary | ICD-10-CM

## 2020-03-29 MED ORDER — SODIUM CHLORIDE 0.9 % IV SOLN: 500.0000 mL | Freq: Once | INTRAVENOUS | Status: DC

## 2020-03-29 NOTE — Op Note (Signed)
[facility] Patient Name: Paula Lambert Procedure Date: 03/29/2020 2:52 PM MRN: [id number] Endoscopist: Tressia Danas MD, MD Age: 49 Referring MD:  Date of Birth: June 06, 1971 Gender: Female Account #: [id number] Procedure:                Colonoscopy Indications:              Screening for colorectal malignant neoplasm                           Prior colonoscopy about 10 years ago                           No known family history of colon cancer or polyps Medicines:                Monitored Anesthesia Care Procedure:                Pre-Anesthesia Assessment:                           - Prior to the procedure, a History and Physical                            was performed, and patient medications and                            allergies were reviewed. The patient's tolerance of                            previous anesthesia was also reviewed. The risks                            and benefits of the procedure and the sedation                            options and risks were discussed with the patient.                            All questions were answered, and informed consent                            was obtained. Prior Anticoagulants: The patient has                            taken no previous anticoagulant or antiplatelet                            agents. ASA Grade Assessment: II - A patient with                            mild systemic disease. After reviewing the risks                            and benefits, the patient was deemed in  satisfactory condition to undergo the procedure.                           After obtaining informed consent, the colonoscope                            was passed under direct vision. Throughout the                            procedure, the patient's blood pressure, pulse, and                            oxygen saturations were monitored continuously. The                            Colonoscope was  introduced through the anus and                            advanced to the 3 cm into the ileum. A second                            forward view of the right colon was performed. The                            colonoscopy was performed without difficulty. The                            patient tolerated the procedure well. The quality                            of the bowel preparation was good. The terminal                            ileum, ileocecal valve, appendiceal orifice, and                            rectum were photographed. Scope In: 2:55:38 PM Scope Out: 3:07:53 PM Scope Withdrawal Time: 0 hours 9 minutes 5 seconds  Total Procedure Duration: 0 hours 12 minutes 15 seconds  Findings:                 The perianal and digital rectal examinations were                            normal.                           The entire examined colon appeared normal on direct                            and retroflexion views. Complications:            No immediate complications. Estimated Blood Loss:     Estimated blood loss: none. Impression:               - The entire  examined colon is normal on direct and                            retroflexion views.                           - No specimens collected. Recommendation:           - Patient has a contact number available for                            emergencies. The signs and symptoms of potential                            delayed complications were discussed with the                            patient. Return to normal activities tomorrow.                            Written discharge instructions were provided to the                            patient.                           - Resume previous diet.                           - Continue present medications.                           - Repeat colonoscopy in 10 years for surveillance,                            earlier with any new symptoms.                           - Emerging evidence  supports eating a diet of                            fruits, vegetables, grains, calcium, and yogurt                            while reducing red meat and alcohol may reduce the                            risk of colon cancer.                           - Thank you for allowing me to be involved in your                            colon cancer prevention. Tressia Danas MD, MD 03/29/2020 3:13:11 PM This report has been signed electronically.

## 2020-03-29 NOTE — Progress Notes (Signed)
A/ox3, pleased with MAC, report to RN 

## 2020-03-29 NOTE — Progress Notes (Signed)
No problems noted in the recovery room. maw 

## 2020-03-29 NOTE — Patient Instructions (Addendum)
Your blood sugar was 83 in the recovery room. You may resume your current medications today. Repeat colonoscopy in 10 years. Please call if any questions or concerns.     YOU HAD AN ENDOSCOPIC PROCEDURE TODAY AT THE [facility]:   Refer to the procedure report that was given to you for any specific questions about what was found during the examination.  If the procedure report does not answer your questions, please call your gastroenterologist to clarify.  If you requested that your care partner not be given the details of your procedure findings, then the procedure report has been included in a sealed envelope for you to review at your convenience later.  YOU SHOULD EXPECT: Some feelings of bloating in the abdomen. Passage of more gas than usual.  Walking can help get rid of the air that was put into your GI tract during the procedure and reduce the bloating. If you had a lower endoscopy (such as a colonoscopy or flexible sigmoidoscopy) you may notice spotting of blood in your stool or on the toilet paper. If you underwent a bowel prep for your procedure, you may not have a normal bowel movement for a few days.  Please Note:  You might notice some irritation and congestion in your nose or some drainage.  This is from the oxygen used during your procedure.  There is no need for concern and it should clear up in a day or so.  SYMPTOMS TO REPORT IMMEDIATELY:   Following lower endoscopy (colonoscopy or flexible sigmoidoscopy):  Excessive amounts of blood in the stool  Significant tenderness or worsening of abdominal pains  Swelling of the abdomen that is new, acute  Fever of 100F or higher   For urgent or emergent issues, a gastroenterologist can be reached at any hour by calling [phone]. Do not use MyChart messaging for urgent concerns.    DIET:  We do recommend a small meal at first, but then you may proceed to your regular diet.  Drink plenty of fluids but you should  avoid alcoholic beverages for 24 hours.  ACTIVITY:  You should plan to take it easy for the rest of today and you should NOT DRIVE or use heavy machinery until tomorrow (because of the sedation medicines used during the test).    FOLLOW UP: Our staff will call the number listed on your records 48-72 hours following your procedure to check on you and address any questions or concerns that you may have regarding the information given to you following your procedure. If we do not reach you, we will leave a message.  We will attempt to reach you two times.  During this call, we will ask if you have developed any symptoms of COVID 19. If you develop any symptoms (ie: fever, flu-like symptoms, shortness of breath, cough etc.) before then, please call [phone].  If you test positive for Covid 19 in the 2 weeks post procedure, please call and report this information to [place].    If any biopsies were taken you will be contacted by phone or by letter within the next 1-3 weeks.  Please call us at [phone] if you have not heard about the biopsies in 3 weeks.    SIGNATURES/CONFIDENTIALITY: You and/or your care partner have signed paperwork which will be entered into your electronic medical record.  These signatures attest to the fact that that the information above on your After Visit Summary has been reviewed and is understood.  Full  responsibility of the confidentiality of this discharge information lies with you and/or your care-partner.Marland Kitchen

## 2020-03-29 NOTE — Progress Notes (Signed)
Vital signs checked by:CW  The patient states no changes in medical or surgical history since pre-visit screening on 03/14/20.   

## 2020-04-02 ENCOUNTER — Telehealth: Payer: Self-pay | Admitting: *Deleted

## 2020-04-02 DIAGNOSIS — M6281 Muscle weakness (generalized): Secondary | ICD-10-CM | POA: Diagnosis not present

## 2020-04-02 DIAGNOSIS — M25642 Stiffness of left hand, not elsewhere classified: Secondary | ICD-10-CM | POA: Diagnosis not present

## 2020-04-02 DIAGNOSIS — M7712 Lateral epicondylitis, left elbow: Secondary | ICD-10-CM | POA: Diagnosis not present

## 2020-04-02 DIAGNOSIS — M25522 Pain in left elbow: Secondary | ICD-10-CM | POA: Diagnosis not present

## 2020-04-02 NOTE — Telephone Encounter (Signed)
°  Follow up Call-  Call back number 03/29/2020  Post procedure Call Back phone  # [phone]  Permission to leave phone message Yes  Some recent data might be hidden     Patient questions:  Do you have a fever, pain , or abdominal swelling? No. Pain Score  0 *  Have you tolerated food without any problems? Yes.    Have you been able to return to your normal activities? Yes.    Do you have any questions about your discharge instructions: Diet   No. Medications  No. Follow up visit  No.  Do you have questions or concerns about your Care? No.  Actions: * If pain score is 4 or above: No action needed, pain <4.  1. Have you developed a fever since your procedure? no  2.   Have you had an respiratory symptoms (SOB or cough) since your procedure? no  3.   Have you tested positive for COVID 19 since your procedure no  4.   Have you had any family members/close contacts diagnosed with the COVID 19 since your procedure?  no   If yes to any of these questions please route to Laverna Peace, RN and Karlton Lemon, RN

## 2020-04-03 ENCOUNTER — Other Ambulatory Visit: Payer: Self-pay

## 2020-04-03 ENCOUNTER — Ambulatory Visit
Admission: RE | Admit: 2020-04-03 | Discharge: 2020-04-03 | Disposition: A | Discharge status: Home / Self Care | Payer: Managed Care, Other (non HMO) | Source: Ambulatory Visit | Attending: Physician Assistant | Admitting: Physician Assistant

## 2020-04-03 ENCOUNTER — Other Ambulatory Visit: Payer: Self-pay | Admitting: Physician Assistant

## 2020-04-03 DIAGNOSIS — R928 Other abnormal and inconclusive findings on diagnostic imaging of breast: Secondary | ICD-10-CM

## 2020-04-03 DIAGNOSIS — M6281 Muscle weakness (generalized): Secondary | ICD-10-CM | POA: Diagnosis not present

## 2020-04-03 DIAGNOSIS — M25642 Stiffness of left hand, not elsewhere classified: Secondary | ICD-10-CM | POA: Diagnosis not present

## 2020-04-03 DIAGNOSIS — M7712 Lateral epicondylitis, left elbow: Secondary | ICD-10-CM | POA: Diagnosis not present

## 2020-04-03 DIAGNOSIS — M25522 Pain in left elbow: Secondary | ICD-10-CM | POA: Diagnosis not present

## 2020-04-08 DIAGNOSIS — M7712 Lateral epicondylitis, left elbow: Secondary | ICD-10-CM | POA: Diagnosis not present

## 2020-04-08 DIAGNOSIS — M25642 Stiffness of left hand, not elsewhere classified: Secondary | ICD-10-CM | POA: Diagnosis not present

## 2020-04-08 DIAGNOSIS — M6281 Muscle weakness (generalized): Secondary | ICD-10-CM | POA: Diagnosis not present

## 2020-04-08 DIAGNOSIS — M25522 Pain in left elbow: Secondary | ICD-10-CM | POA: Diagnosis not present

## 2020-04-12 DIAGNOSIS — M25642 Stiffness of left hand, not elsewhere classified: Secondary | ICD-10-CM | POA: Diagnosis not present

## 2020-04-12 DIAGNOSIS — M7712 Lateral epicondylitis, left elbow: Secondary | ICD-10-CM | POA: Diagnosis not present

## 2020-04-12 DIAGNOSIS — M6281 Muscle weakness (generalized): Secondary | ICD-10-CM | POA: Diagnosis not present

## 2020-04-12 DIAGNOSIS — M25522 Pain in left elbow: Secondary | ICD-10-CM | POA: Diagnosis not present

## 2020-04-15 DIAGNOSIS — M7712 Lateral epicondylitis, left elbow: Secondary | ICD-10-CM | POA: Diagnosis not present

## 2020-04-15 DIAGNOSIS — M25642 Stiffness of left hand, not elsewhere classified: Secondary | ICD-10-CM | POA: Diagnosis not present

## 2020-04-15 DIAGNOSIS — M6281 Muscle weakness (generalized): Secondary | ICD-10-CM | POA: Diagnosis not present

## 2020-04-15 DIAGNOSIS — M25522 Pain in left elbow: Secondary | ICD-10-CM | POA: Diagnosis not present

## 2020-04-24 DIAGNOSIS — M7712 Lateral epicondylitis, left elbow: Secondary | ICD-10-CM | POA: Diagnosis not present

## 2020-04-24 DIAGNOSIS — M25642 Stiffness of left hand, not elsewhere classified: Secondary | ICD-10-CM | POA: Diagnosis not present

## 2020-04-24 DIAGNOSIS — M25522 Pain in left elbow: Secondary | ICD-10-CM | POA: Diagnosis not present

## 2020-04-24 DIAGNOSIS — M6281 Muscle weakness (generalized): Secondary | ICD-10-CM | POA: Diagnosis not present

## 2020-05-01 DIAGNOSIS — M25522 Pain in left elbow: Secondary | ICD-10-CM | POA: Diagnosis not present

## 2020-05-01 DIAGNOSIS — M6281 Muscle weakness (generalized): Secondary | ICD-10-CM | POA: Diagnosis not present

## 2020-05-01 DIAGNOSIS — M25642 Stiffness of left hand, not elsewhere classified: Secondary | ICD-10-CM | POA: Diagnosis not present

## 2020-05-01 DIAGNOSIS — M7712 Lateral epicondylitis, left elbow: Secondary | ICD-10-CM | POA: Diagnosis not present

## 2020-05-03 ENCOUNTER — Encounter: Payer: Self-pay | Admitting: Family Medicine

## 2020-05-03 ENCOUNTER — Ambulatory Visit (INDEPENDENT_AMBULATORY_CARE_PROVIDER_SITE_OTHER): Payer: Managed Care, Other (non HMO) | Admitting: Family Medicine

## 2020-05-03 ENCOUNTER — Other Ambulatory Visit: Payer: Self-pay

## 2020-05-03 DIAGNOSIS — M6281 Muscle weakness (generalized): Secondary | ICD-10-CM | POA: Diagnosis not present

## 2020-05-03 DIAGNOSIS — M7712 Lateral epicondylitis, left elbow: Secondary | ICD-10-CM | POA: Diagnosis not present

## 2020-05-03 DIAGNOSIS — M25522 Pain in left elbow: Secondary | ICD-10-CM | POA: Diagnosis not present

## 2020-05-03 DIAGNOSIS — M25642 Stiffness of left hand, not elsewhere classified: Secondary | ICD-10-CM | POA: Diagnosis not present

## 2020-05-03 NOTE — Progress Notes (Signed)
Office Visit Note   Patient: Paula Lambert           Date of Birth: [birth year]           MRN: [id number] Visit Date: 05/03/2020 Requested by: Mayer Masker, PA-C [address] [place],  [place] [address] PCP: Mayer Masker, PA-C  Subjective: Chief Complaint  Patient presents with  . Left Elbow - Pain, Follow-up    Feeling better with PT (just went again this morning). Does have some pain superior to elbow, posterior arm.   Thumb is "doing great."    HPI: She is here for follow-up left elbow pain.  It has been about 3 months since she fell.  Exam last visit was consistent with a partial tear of the common extensor tendon.  She has been working with occupational/hand therapy and has noted improvement, but her pain seems to be more on the posterior lateral aspect of her elbow than before.  Denies any numbness or tingling.  She feels like sleeping is making it worse because she sleeps with her arm flexed.  She wonders whether a splint might help.              ROS:   All other systems were reviewed and are negative.  Objective: Vital Signs: There were no vitals taken for this visit.  Physical Exam:  General:  Alert and oriented, in no acute distress. Pulm:  Breathing unlabored. Psy:  Normal mood, congruent affect. Skin: No erythema Left elbow: Full active range of motion.  Point tender at the common extensor tendon at the lateral epicondyle.  Pain with wrist extension and third finger extension against resistance, pain with supination against resistance.  No pain with biceps flexion or triceps extension, no tenderness at the radial tunnel.  No tenderness over the triceps tendon today.  Imaging: No results found.  Assessment & Plan: 1.  Left elbow lateral epicondylitis with partial tear of common extensor tendon -Continue current therapy.  Splint the elbow in extension at night.  If symptoms persist, we will try a one-time cortisone injection.  If that does not help,  then dextrose prolotherapy.     Procedures: No procedures performed  No notes on file     PMFS History: Patient Active Problem List   Diagnosis Date Noted  . Stress and adjustment reaction 11/06/2018  . Family history of aneurysm of blood vessel of brain 09/28/2018  . Costochondritis-    L anterior S-C jts T2-6 07/14/2018  . Acute right-sided thoracic back pain 06/24/2018  . Obesity (BMI 30-39.9) 04/13/2018  . Right foot pain 04/13/2018  . Type 2 diabetes mellitus with complication, without long-term current use of insulin ([name]) 10/12/2017  . Hypertension associated with diabetes ([name]) 10/12/2017  . Mixed diabetic hyperlipidemia associated with type 2 diabetes mellitus ([name]) 10/12/2017  . Elevated TSH 10/12/2017  . Chronic pansinusitis 09/08/2017  . ETD (Eustachian tube dysfunction), unspecified laterality 09/08/2017  . Eczema 07/14/2017  . Plantar fasciitis of right foot 11/24/2016  . Screening for breast cancer 09/24/2016  . Hypokalemia 09/15/2016  . Sinus tachycardia 09/15/2016  . Asthma exacerbation 09/14/2016  . Encounter for annual routine gynecological examination 07/09/2016  . Vaccination not carried out because of patient refusal 06/20/2016  . Morbid obesity ([name]) 06/20/2016  . Vitamin D deficiency 06/19/2016  . Cough variant asthma 06/05/2016  . Sleep apnea 06/05/2016  . Heart murmur 06/05/2016  . GERD (gastroesophageal reflux disease) 06/05/2016  . Environmental and seasonal allergies 06/05/2016  Past Medical History:  Diagnosis Date  . Allergy   . Asthma   . Depression    hx - not currently  . Diabetes mellitus without complication ([name])    type 2  . GERD (gastroesophageal reflux disease)   . Heart murmur    leaking valve, never caused any problems  . HSV infection   . Hypertension   . Sickle cell trait ([name])   . Sleep apnea    does not use cpap, patient states "lost weight"    Family History  Problem Relation Age of Onset  . Aneurysm Mother    . Stroke Father   . Hypertension Father   . Prostate cancer Father   . Diabetes Father   . Heart attack Father   . Aneurysm Maternal Uncle   . Stroke Maternal Grandmother   . Hypertension Maternal Grandmother   . Heart disease Maternal Grandmother   . Diabetes Maternal Grandmother   . Aneurysm Maternal Grandfather   . Stroke Paternal Grandmother   . Stomach cancer Neg Hx   . Rectal cancer Neg Hx   . Colon cancer Neg Hx     Past Surgical History:  Procedure Laterality Date  . BREAST SURGERY     reduction  . COLONOSCOPY    . LIPOMA EXCISION    . TENDON RELEASE Right    elbow  . TONSILLECTOMY    . WISDOM TOOTH EXTRACTION     Social History   Occupational History  . Not on file  Tobacco Use  . Smoking status: Never Smoker  . Smokeless tobacco: Never Used  Vaping Use  . Vaping Use: Never used  Substance and Sexual Activity  . Alcohol use: Yes    Comment: occasionally  . Drug use: No  . Sexual activity: Not Currently    Birth control/protection: None

## 2020-05-06 DIAGNOSIS — M25522 Pain in left elbow: Secondary | ICD-10-CM | POA: Diagnosis not present

## 2020-05-06 DIAGNOSIS — M7712 Lateral epicondylitis, left elbow: Secondary | ICD-10-CM | POA: Diagnosis not present

## 2020-05-06 DIAGNOSIS — M25642 Stiffness of left hand, not elsewhere classified: Secondary | ICD-10-CM | POA: Diagnosis not present

## 2020-05-06 DIAGNOSIS — M6281 Muscle weakness (generalized): Secondary | ICD-10-CM | POA: Diagnosis not present

## 2020-05-13 DIAGNOSIS — M25522 Pain in left elbow: Secondary | ICD-10-CM | POA: Diagnosis not present

## 2020-05-13 DIAGNOSIS — M6281 Muscle weakness (generalized): Secondary | ICD-10-CM | POA: Diagnosis not present

## 2020-05-13 DIAGNOSIS — M7712 Lateral epicondylitis, left elbow: Secondary | ICD-10-CM | POA: Diagnosis not present

## 2020-05-13 DIAGNOSIS — M25642 Stiffness of left hand, not elsewhere classified: Secondary | ICD-10-CM | POA: Diagnosis not present

## 2020-05-15 DIAGNOSIS — M25522 Pain in left elbow: Secondary | ICD-10-CM | POA: Diagnosis not present

## 2020-05-15 DIAGNOSIS — M7712 Lateral epicondylitis, left elbow: Secondary | ICD-10-CM | POA: Diagnosis not present

## 2020-05-15 DIAGNOSIS — M25642 Stiffness of left hand, not elsewhere classified: Secondary | ICD-10-CM | POA: Diagnosis not present

## 2020-05-15 DIAGNOSIS — M6281 Muscle weakness (generalized): Secondary | ICD-10-CM | POA: Diagnosis not present

## 2020-05-30 ENCOUNTER — Other Ambulatory Visit: Payer: Self-pay | Admitting: Physician Assistant

## 2020-05-31 ENCOUNTER — Other Ambulatory Visit: Payer: Self-pay

## 2020-05-31 ENCOUNTER — Encounter: Payer: Self-pay | Admitting: Physician Assistant

## 2020-05-31 ENCOUNTER — Ambulatory Visit (INDEPENDENT_AMBULATORY_CARE_PROVIDER_SITE_OTHER): Payer: Managed Care, Other (non HMO) | Admitting: Physician Assistant

## 2020-05-31 DIAGNOSIS — E1159 Type 2 diabetes mellitus with other circulatory complications: Secondary | ICD-10-CM | POA: Diagnosis not present

## 2020-05-31 DIAGNOSIS — E118 Type 2 diabetes mellitus with unspecified complications: Secondary | ICD-10-CM

## 2020-05-31 DIAGNOSIS — J45909 Unspecified asthma, uncomplicated: Secondary | ICD-10-CM

## 2020-05-31 DIAGNOSIS — E782 Mixed hyperlipidemia: Secondary | ICD-10-CM

## 2020-05-31 DIAGNOSIS — Z789 Other specified health status: Secondary | ICD-10-CM

## 2020-05-31 DIAGNOSIS — E1169 Type 2 diabetes mellitus with other specified complication: Secondary | ICD-10-CM

## 2020-05-31 DIAGNOSIS — I152 Hypertension secondary to endocrine disorders: Secondary | ICD-10-CM

## 2020-05-31 LAB — POCT GLYCOSYLATED HEMOGLOBIN (HGB A1C): Hemoglobin A1C: 6.3 % — AB (ref 4.0–5.6)

## 2020-05-31 MED ORDER — MONTELUKAST SODIUM 10 MG PO TABS: 10.0000 mg | ORAL_TABLET | Freq: Every day | ORAL | 1 refills | Status: DC

## 2020-05-31 NOTE — Patient Instructions (Signed)

## 2020-05-31 NOTE — Progress Notes (Signed)
Established Patient Office Visit  Subjective:  Patient ID: Paula Lambert, female    DOB: Aug 15, 1970  Age: 49 y.o. MRN: [id number]  CC:  Chief Complaint  Patient presents with  . Diabetes  . Hypertension  . Hyperlipidemia    HPI Paula Lambert presents for follow up on diabetes mellitus, hypertension and hyperlipidemia.   Diabetes: Pt denies increased urination or thirst. Pt reports medication compliance. No hypoglycemic events. Checking glucose at home. FBS 101-110. Due to the holidays had not been as diligent with monitoring carbohydrates and glucose.  HTN: Pt denies chest pain, palpitations, dizziness, headache or lower extremity swelling. Taking medication as directed without side effects. Checks BP at home and readings range 120s/70s. Pt tries to stay hydrated.  HLD: Pt reports she has reduced fried/greasy foods and uses the air fry. Was on Lipitor in the past but experienced significant cognition changes which improved after stopping medication and prefers not to be on statin therapy.   Asthma: Patient reports has been experiencing some wheezing and a cough related to the weather change.  Has been taking Zyrtec daily.  Has been using albuterol more frequently.  Past Medical History:  Diagnosis Date  . Allergy   . Asthma   . Depression    hx - not currently  . Diabetes mellitus without complication ([place])    type 2  . GERD (gastroesophageal reflux disease)   . Heart murmur    leaking valve, never caused any problems  . HSV infection   . Hypertension   . Sickle cell trait ([place])   . Sleep apnea    does not use cpap, patient states "lost weight"    Past Surgical History:  Procedure Laterality Date  . BREAST SURGERY     reduction  . COLONOSCOPY    . LIPOMA EXCISION    . TENDON RELEASE Right    elbow  . TONSILLECTOMY    . WISDOM TOOTH EXTRACTION      Family History  Problem Relation Age of Onset  . Aneurysm Mother   . Stroke Father   . Hypertension Father    . Prostate cancer Father   . Diabetes Father   . Heart attack Father   . Aneurysm Maternal Uncle   . Stroke Maternal Grandmother   . Hypertension Maternal Grandmother   . Heart disease Maternal Grandmother   . Diabetes Maternal Grandmother   . Aneurysm Maternal Grandfather   . Stroke Paternal Grandmother   . Stomach cancer Neg Hx   . Rectal cancer Neg Hx   . Colon cancer Neg Hx     Social History   Socioeconomic History  . Marital status: Married    Spouse name: Not on file  . Number of children: Not on file  . Years of education: Not on file  . Highest education level: Not on file  Occupational History  . Not on file  Tobacco Use  . Smoking status: Never Smoker  . Smokeless tobacco: Never Used  Vaping Use  . Vaping Use: Never used  Substance and Sexual Activity  . Alcohol use: Yes    Comment: occasionally  . Drug use: No  . Sexual activity: Not Currently    Birth control/protection: None  Other Topics Concern  . Not on file  Social History Narrative  . Not on file   Social Determinants of Health   Financial Resource Strain: Not on file  Food Insecurity: Not on file  Transportation Needs: Not on file  Physical Activity:  Not on file  Stress: Not on file  Social Connections: Not on file  Intimate Partner Violence: Not on file    Outpatient Medications Prior to Visit  Medication Sig Dispense Refill  . ACCU-CHEK GUIDE test strip TEST FASTING LEVEL IN MORNING AND 2 HOURS AFTER LARGEST MEAL EACH DAY 100 each 1  . albuterol (PROAIR HFA) 108 (90 Base) MCG/ACT inhaler 1 PUFF INHALE EVERY 4-6 HOURS AS NEEDED (PRN) 1 Inhaler 1  . AMBULATORY NON FORMULARY MEDICATION Single glucometer with lancets, test strips 1 each 0  . blood glucose meter kit and supplies KIT Dispense based on patient and insurance preference. Use in AM to test fasting and 2 hours after largest meal each day. 1 each 0  . cetirizine (ZYRTEC) 10 MG tablet Take 10 mg by mouth daily.    . fluticasone  (FLONASE) 50 MCG/ACT nasal spray Place 1 spray into both nostrils daily. 16 g 0  . losartan-hydrochlorothiazide (HYZAAR) 50-12.5 MG tablet TAKE 1 TABLET BY MOUTH EVERY DAY 90 tablet 0  . metFORMIN (GLUCOPHAGE) 500 MG tablet TAKE 1 TABLET DAILY WITH BREAKFAST. NO FURTHER REFILLS UNTIL PATIENT IS SEEN IN OFFICE 15 tablet 0  . ONETOUCH DELICA LANCETS FINE MISC USE ONE LANCET TO CHECK BLOOD SUGARS EVERY MORNING FASTING AND 2 HOURS AFTER LARGEST MEAL. 100 each 4   No facility-administered medications prior to visit.    Allergies  Allergen Reactions  . Ultram [Tramadol Hcl] Other (See Comments)    syncope  . Amoxicillin Hives and Swelling  . Atorvastatin Other (See Comments)    Speech difficulities  . Penicillins Hives and Swelling  . Sulfa Antibiotics Hives  . Peanut Oil Other (See Comments)    Unknown, from allergy testing  . Sesame Oil Hives  . Other Other (See Comments)    PEANUTS AND ALMONDS    ROS Review of Systems A fourteen system review of systems was performed and found to be positive as per HPI.  Objective:    Physical Exam General:  Well Developed, well nourished, appropriate for stated age.  Neuro:  Alert and oriented,  extra-ocular muscles intact  HEENT:  Normocephalic, atraumatic, neck supple Skin:  no gross rash, warm, dry. Moderate mobile mass on right upper back  Cardiac:  RRR, S1 S2 Respiratory:  ECTA B/L with mild expiratory wheezing without crackles or rales, Not using accessory muscles, speaking in full sentences- unlabored. Vascular:  Ext warm, no cyanosis apprec.; cap RF less 2 sec. Psych:  No HI/SI, judgement and insight good, Euthymic mood. Full Affect.   There were no vitals taken for this visit. Wt Readings from Last 3 Encounters:  03/29/20 189 lb (85.7 kg)  03/14/20 189 lb 12.8 oz (86.1 kg)  02/02/20 192 lb 4.8 oz (87.2 kg)     Health Maintenance Due  Topic Date Due  . PNEUMOCOCCAL POLYSACCHARIDE VACCINE AGE 43-64 HIGH RISK  Never done  .  OPHTHALMOLOGY EXAM  Never done  . PAP SMEAR-Modifier  09/21/2019  . INFLUENZA VACCINE  01/21/2020    There are no preventive care reminders to display for this patient.  Lab Results  Component Value Date   TSH 4.420 01/31/2020   Lab Results  Component Value Date   WBC 7.7 01/31/2020   HGB 12.2 01/31/2020   HCT 37.4 01/31/2020   MCV 88 01/31/2020   PLT 181 01/31/2020   Lab Results  Component Value Date   NA 137 01/31/2020   K 4.0 01/31/2020   CO2 25 01/31/2020  GLUCOSE 93 01/31/2020   BUN 12 01/31/2020   CREATININE 0.88 01/31/2020   BILITOT 0.3 01/31/2020   ALKPHOS 74 01/31/2020   AST 14 01/31/2020   ALT 14 01/31/2020   PROT 6.9 01/31/2020   ALBUMIN 4.2 01/31/2020   CALCIUM 9.3 01/31/2020   ANIONGAP 10 09/16/2016   Lab Results  Component Value Date   CHOL 204 (H) 01/31/2020   Lab Results  Component Value Date   HDL 59 01/31/2020   Lab Results  Component Value Date   LDLCALC 127 (H) 01/31/2020   Lab Results  Component Value Date   TRIG 98 01/31/2020   Lab Results  Component Value Date   CHOLHDL 3.5 01/31/2020   Lab Results  Component Value Date   HGBA1C 6.3 (A) 05/31/2020      Assessment & Plan:   Problem List Items Addressed This Visit      Cardiovascular and Mediastinum   Hypertension associated with diabetes ([place])   Relevant Orders   Comp Met (CMET)     Endocrine   Type 2 diabetes mellitus with complication, without long-term current use of insulin ([name]) - Primary   Relevant Orders   POCT glycosylated hemoglobin (Hb A1C) (Completed)   Comp Met (CMET)   Mixed diabetic hyperlipidemia associated with type 2 diabetes mellitus ([name])   Relevant Orders   Lipid Profile    Other Visit Diagnoses    Asthma without acute exacerbation       Relevant Medications   montelukast (SINGULAIR) 10 MG tablet   Statin intolerance         Type 2 diabetes mellitus with complication, without long-term current use of insulin: -A1c today 6.3, mildly  increased from 6.0. -Recommend to reduce carbohydrates and glucose. -Continue current medication regimen.  -Will continue to monitor.  Hypertension associated with diabetes: -Controlled. -Continue current medication regimen. -Follow a low-sodium diet and stay well-hydrated. -Will continue to monitor. -Will collect CMP for medication monitoring.  Mixed diabetic hyperlipidemia associated with type 2 diabetes mellitus, Statin intolerance: -Last lipid panel: Total cholesterol 204, triglycerides 98, HDL 59, LDL 127 -Recommend to continue with dietary changes and follow a heart healthy diet. -Will continue to monitor and repeat lipid panel today.  Asthma without acute exacerbation: -Vital signs including pulse ox within normal limits, minimal expiratory wheezing noted on exam.  Discussed with patient starting Singulair and oral corticosteroid therapy.  Patient agreeable to starting Singulair and prefers to hold off on oral corticosteroid, if starts experiencing significant wheezing or shortness of breath will let me know and will send in prednisone. -Continue to use albuterol as needed and take Zyrtec daily.  -If symptoms become more recurrent then will consider medication adjustments such as Symbicort.   Meds ordered this encounter  Medications  . montelukast (SINGULAIR) 10 MG tablet    Sig: Take 1 tablet (10 mg total) by mouth at bedtime.    Dispense:  90 tablet    Refill:  1    Order Specific Question:   Supervising Provider    Answer:   Beatrice Lecher D [[id number]    Follow-up: Return in about 4 months (around 09/29/2020) for DM, HTN, HLD.    Lorrene Reid, PA-C

## 2020-06-01 ENCOUNTER — Other Ambulatory Visit: Payer: Self-pay | Admitting: Family Medicine

## 2020-06-01 DIAGNOSIS — E118 Type 2 diabetes mellitus with unspecified complications: Secondary | ICD-10-CM

## 2020-06-01 LAB — COMPREHENSIVE METABOLIC PANEL
ALT: 14 IU/L (ref 0–32)
AST: 18 IU/L (ref 0–40)
Albumin/Globulin Ratio: 1.5 (ref 1.2–2.2)
Albumin: 4.5 g/dL (ref 3.8–4.8)
Alkaline Phosphatase: 74 IU/L (ref 44–121)
BUN/Creatinine Ratio: 9 (ref 9–23)
BUN: 8 mg/dL (ref 6–24)
Bilirubin Total: 0.5 mg/dL (ref 0.0–1.2)
CO2: 26 mmol/L (ref 20–29)
Calcium: 9.6 mg/dL (ref 8.7–10.2)
Chloride: 99 mmol/L (ref 96–106)
Creatinine, Ser: 0.88 mg/dL (ref 0.57–1.00)
GFR calc Af Amer: 90 mL/min/{1.73_m2} (ref >59)
GFR calc non Af Amer: 78 mL/min/{1.73_m2} (ref >59)
Globulin, Total: 3.1 g/dL (ref 1.5–4.5)
Glucose: 127 mg/dL — ABNORMAL HIGH (ref 65–99)
Potassium: 4.3 mmol/L (ref 3.5–5.2)
Sodium: 139 mmol/L (ref 134–144)
Total Protein: 7.6 g/dL (ref 6.0–8.5)

## 2020-06-01 LAB — LIPID PANEL
Chol/HDL Ratio: 3.9 ratio (ref 0.0–4.4)
Cholesterol, Total: 230 mg/dL — ABNORMAL HIGH (ref 100–199)
HDL: 59 mg/dL (ref >39)
LDL Chol Calc (NIH): 156 mg/dL — ABNORMAL HIGH (ref 0–99)
Triglycerides: 86 mg/dL (ref 0–149)
VLDL Cholesterol Cal: 15 mg/dL (ref 5–40)

## 2020-06-03 MED ORDER — METFORMIN HCL 500 MG PO TABS: 500.0000 mg | ORAL_TABLET | Freq: Every day | ORAL | 1 refills | Status: DC

## 2020-06-11 ENCOUNTER — Other Ambulatory Visit: Payer: Self-pay | Admitting: Physician Assistant

## 2020-06-11 DIAGNOSIS — J45909 Unspecified asthma, uncomplicated: Secondary | ICD-10-CM

## 2020-06-24 ENCOUNTER — Encounter: Payer: Self-pay | Admitting: Physician Assistant

## 2020-06-24 DIAGNOSIS — J45901 Unspecified asthma with (acute) exacerbation: Secondary | ICD-10-CM

## 2020-06-25 MED ORDER — PREDNISONE 20 MG PO TABS: ORAL_TABLET | ORAL | 0 refills | Status: AC

## 2020-08-11 ENCOUNTER — Other Ambulatory Visit: Payer: Self-pay | Admitting: Physician Assistant

## 2020-08-11 DIAGNOSIS — J45909 Unspecified asthma, uncomplicated: Secondary | ICD-10-CM

## 2020-08-23 ENCOUNTER — Other Ambulatory Visit: Payer: Self-pay | Admitting: Physician Assistant

## 2020-09-27 ENCOUNTER — Ambulatory Visit: Payer: Managed Care, Other (non HMO) | Admitting: Physician Assistant

## 2020-11-30 ENCOUNTER — Other Ambulatory Visit: Payer: Self-pay | Admitting: Physician Assistant

## 2020-11-30 DIAGNOSIS — J45909 Unspecified asthma, uncomplicated: Secondary | ICD-10-CM

## 2020-11-30 DIAGNOSIS — E118 Type 2 diabetes mellitus with unspecified complications: Secondary | ICD-10-CM

## 2020-12-13 IMAGING — US US BREAST*L* LIMITED INC AXILLA
1 series · 9 of 9 positions shown · non-contrast
Comparison: Previous exam(s).

CLINICAL DATA: Screening recall for a possible left breast
distortion and asymmetry. In a patient had a reduction mammoplasty
in 0833. She has a biopsy marking clip in her left breast, though no
information is provided regarding this biopsy or the results.

EXAM:
DIGITAL DIAGNOSTIC UNILATERAL LEFT MAMMOGRAM WITH TOMO AND CAD;
ULTRASOUND LEFT BREAST LIMITED

[Series 1: us breast*left* limited inc axilla · 0.07mm/px · 9 of 9 slices shown]
[im 1/9]
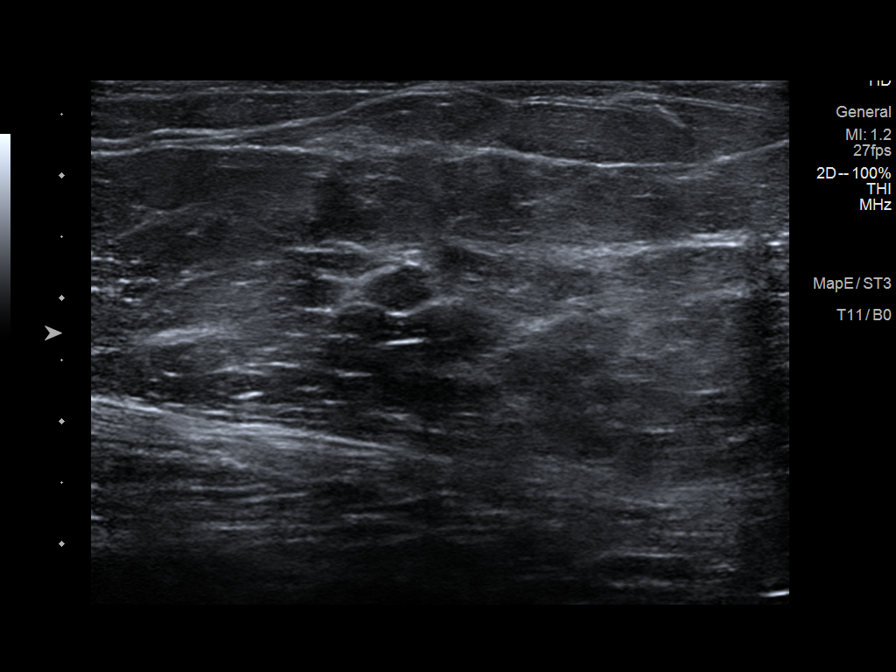
[im 2/9]
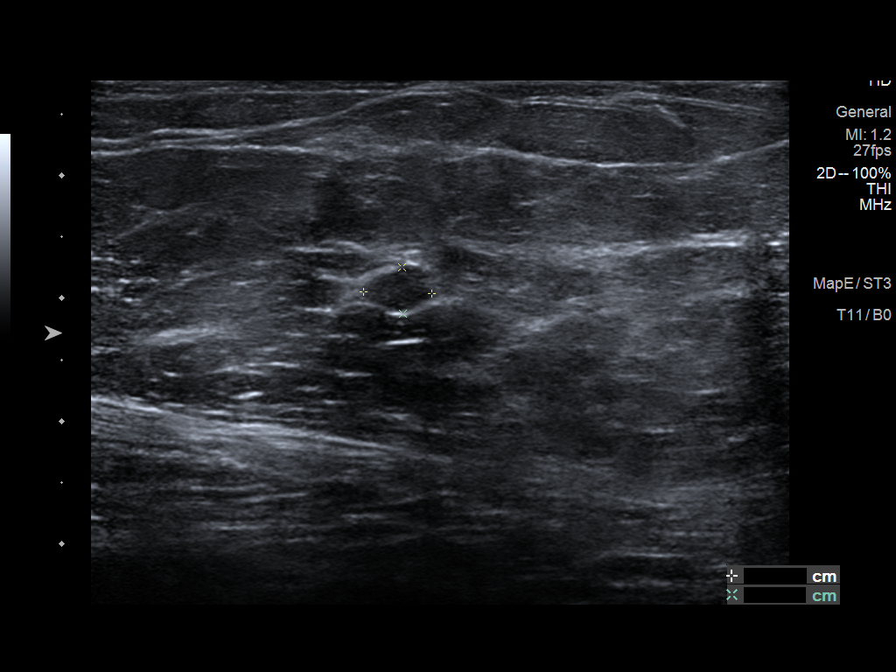
[im 3/9]
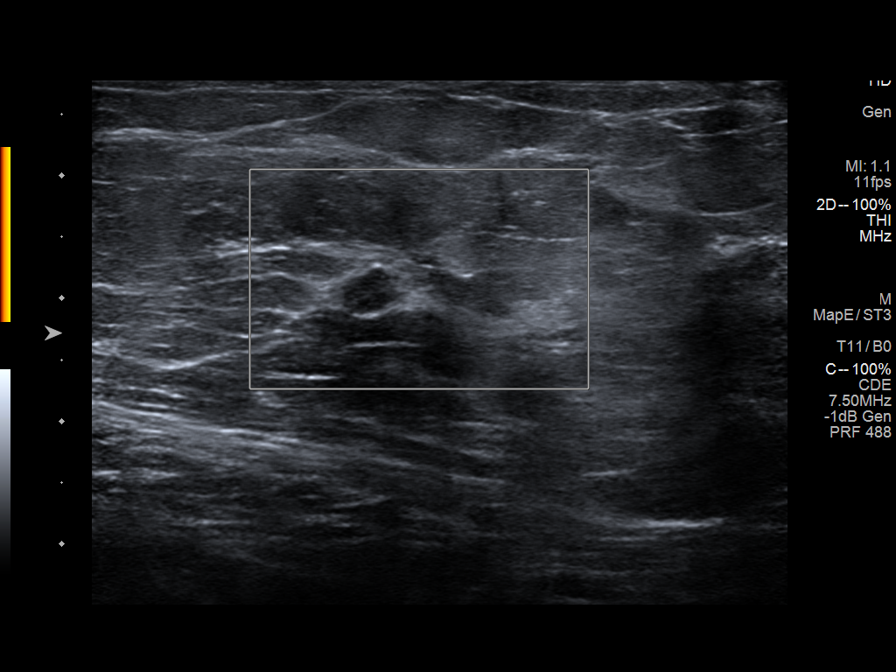
[im 4/9]
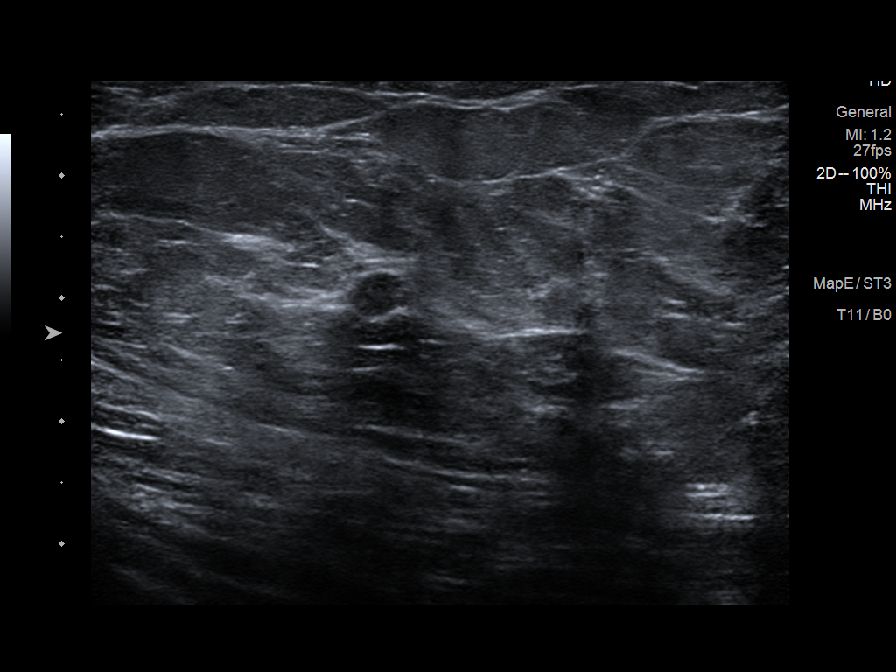
[im 5/9]
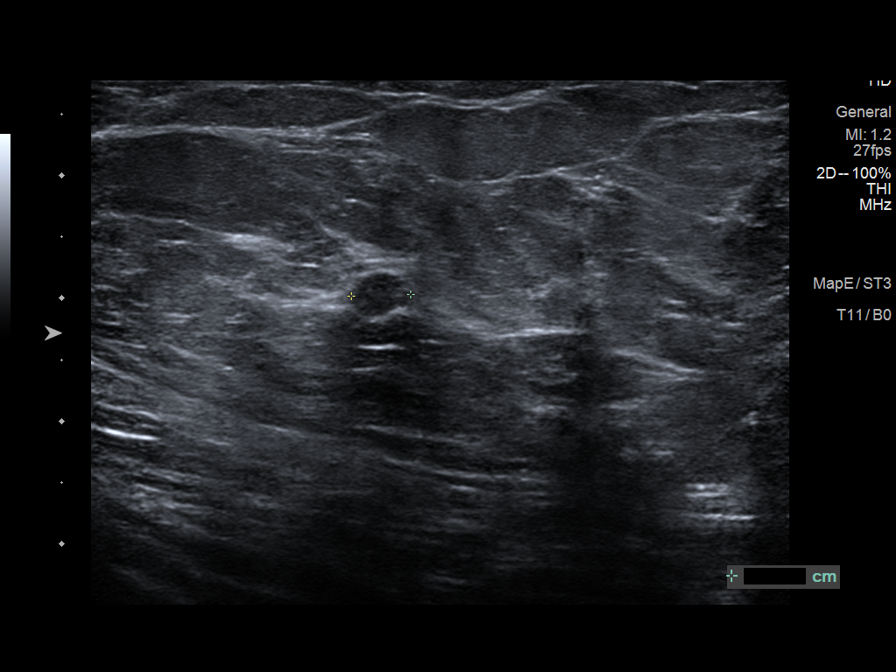
[im 6/9]
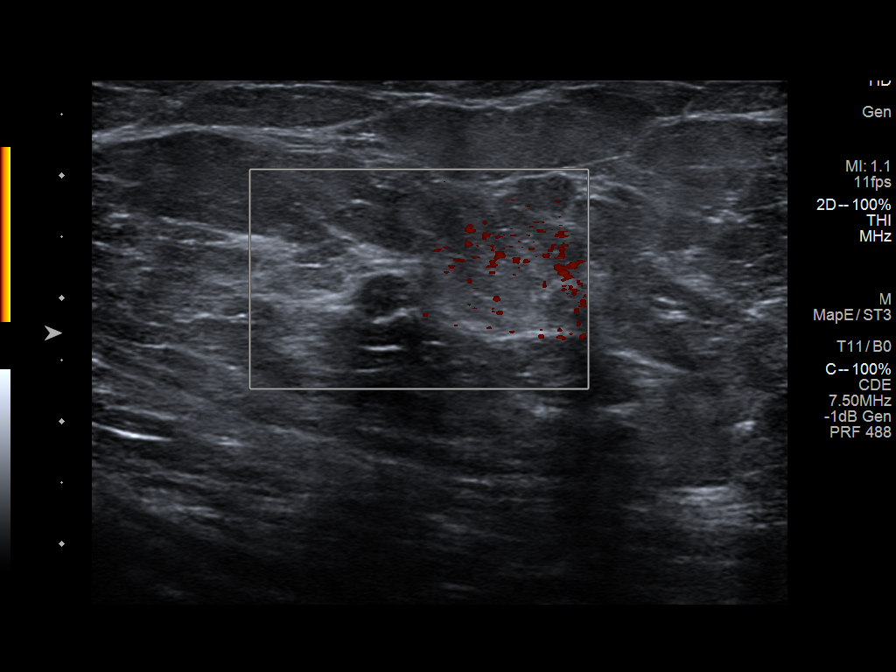
[im 7/9]
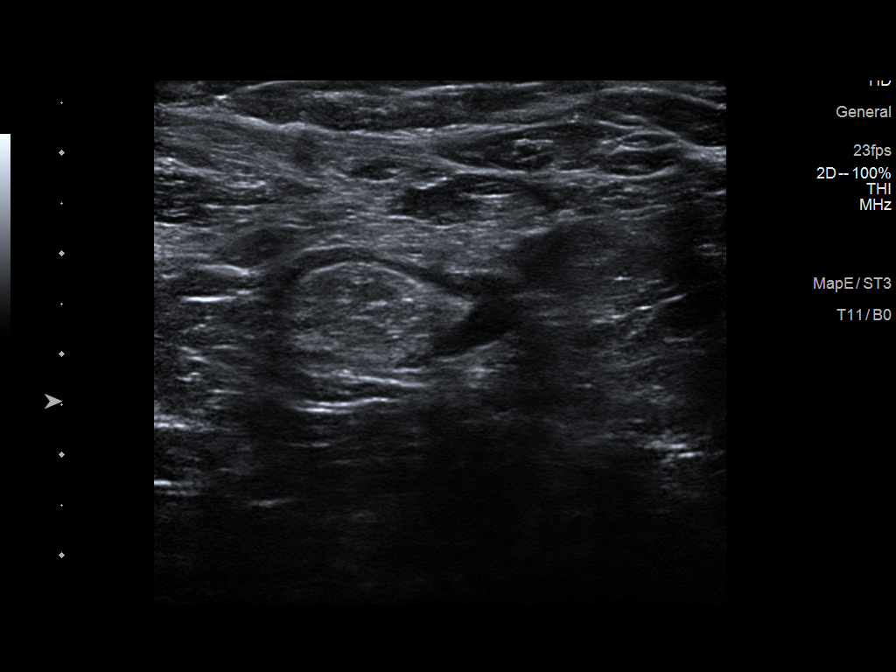
[im 8/9]
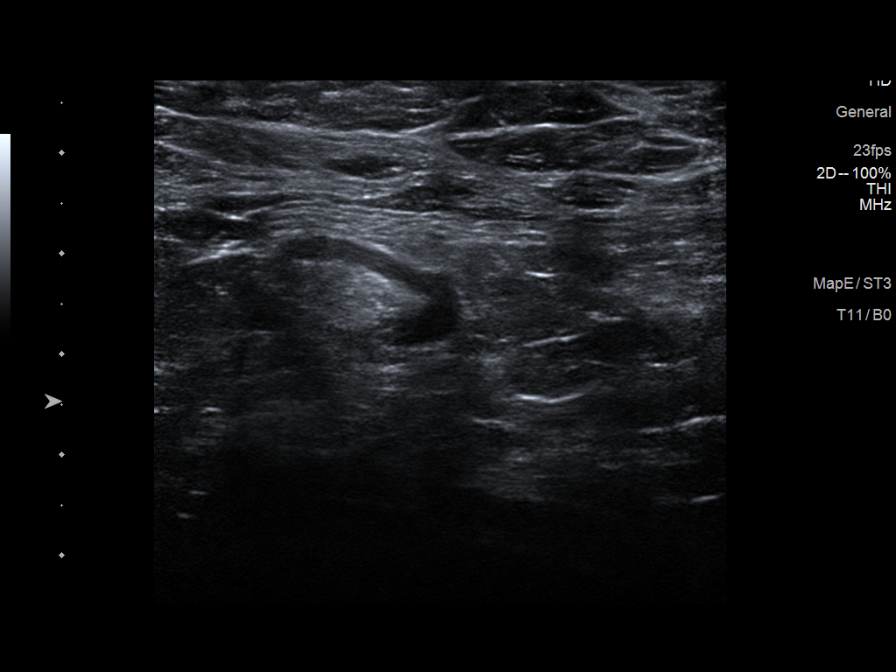
[im 9/9]
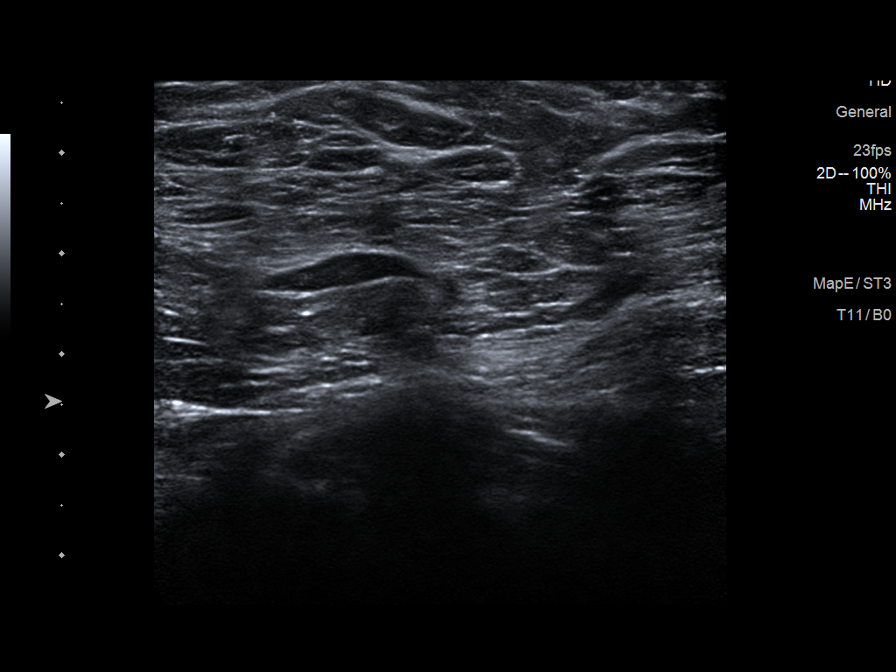

[9 of 9 positions shown; findings below may reference images not displayed]

ACR Breast Density Category b: There are scattered areas of
fibroglandular density.
FINDINGS: Tomosynthesis images through the superior posterior left breast
demonstrates no persistent suspicious findings of distortion. There
is a persistent focal asymmetry in the upper inner far posterior
left breast measuring approximately 5 mm. This is positioned about 2
cm posterior to the spiral shaped biopsy marking clip. This mass was
present on the patient's mammogram from 2510 and is stable.

Mammographic images were processed with CAD.

Ultrasound targeted to the left breast at 11 o'clock, 7 cm from the
nipple demonstrates an oval circumscribed hypoechoic mass measuring
6 x 4 x 5 mm. There is an angular margin to the mass. No blood flow
is seen within it on color Doppler imaging. Ultrasound of the left
axilla demonstrates multiple normal-appearing lymph nodes.
IMPRESSION: 1. The asymmetry in the upper inner quadrant of the left breast seen
on the screening mammogram corresponds with a mass that has been
stable for greater than 2 years. This is a benign finding.

2.  No persistent evidence of distortion in the left breast.

RECOMMENDATION:
Screening mammogram in one year.(Code:V3-W-VU8)

I have discussed the findings and recommendations with the patient.
If applicable, a reminder letter will be sent to the patient
regarding the next appointment.

BI-RADS CATEGORY  2: Benign.

## 2020-12-13 IMAGING — MG MM DIGITAL DIAGNOSTIC UNILAT*L* W/ TOMO W/ CAD
5 series · 6 of 9 positions shown · non-contrast
Comparison: Previous exam(s).

CLINICAL DATA: Screening recall for a possible left breast
distortion and asymmetry. In a patient had a reduction mammoplasty
in 0833. She has a biopsy marking clip in her left breast, though no
information is provided regarding this biopsy or the results.

EXAM:
DIGITAL DIAGNOSTIC UNILATERAL LEFT MAMMOGRAM WITH TOMO AND CAD;
ULTRASOUND LEFT BREAST LIMITED

[L MLO synth-2D (1 of 2)]
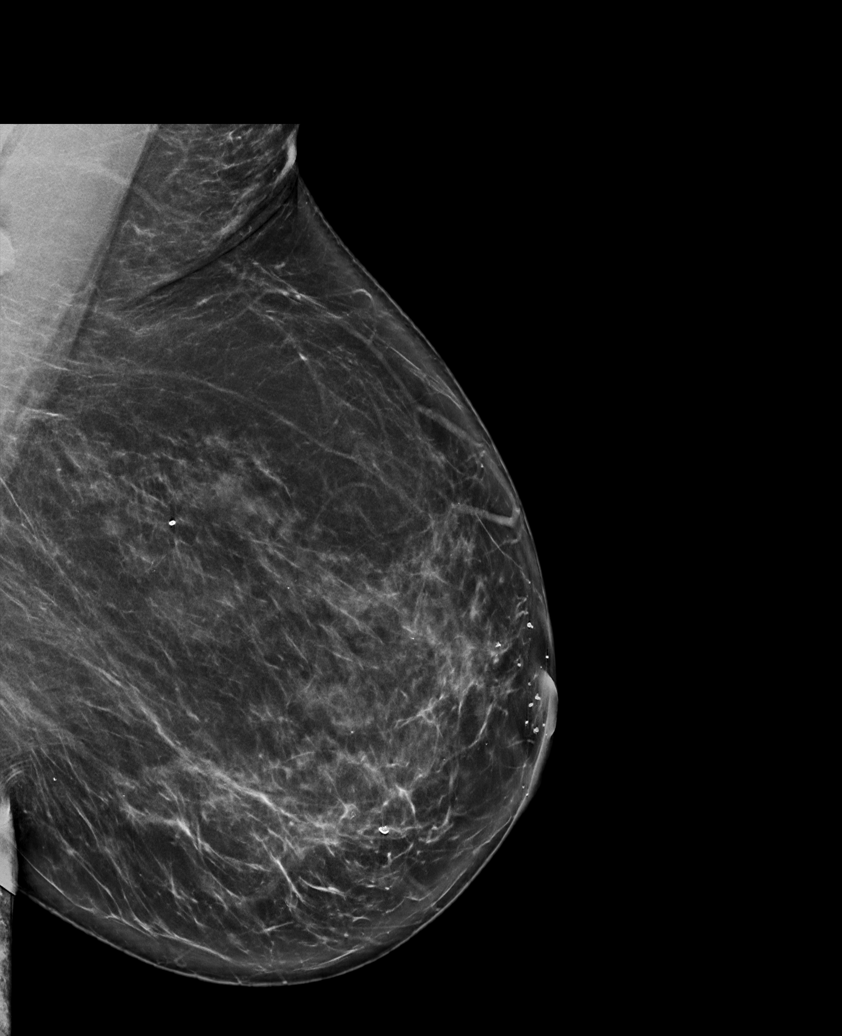

[L CC synth-2D (1 of 2)]
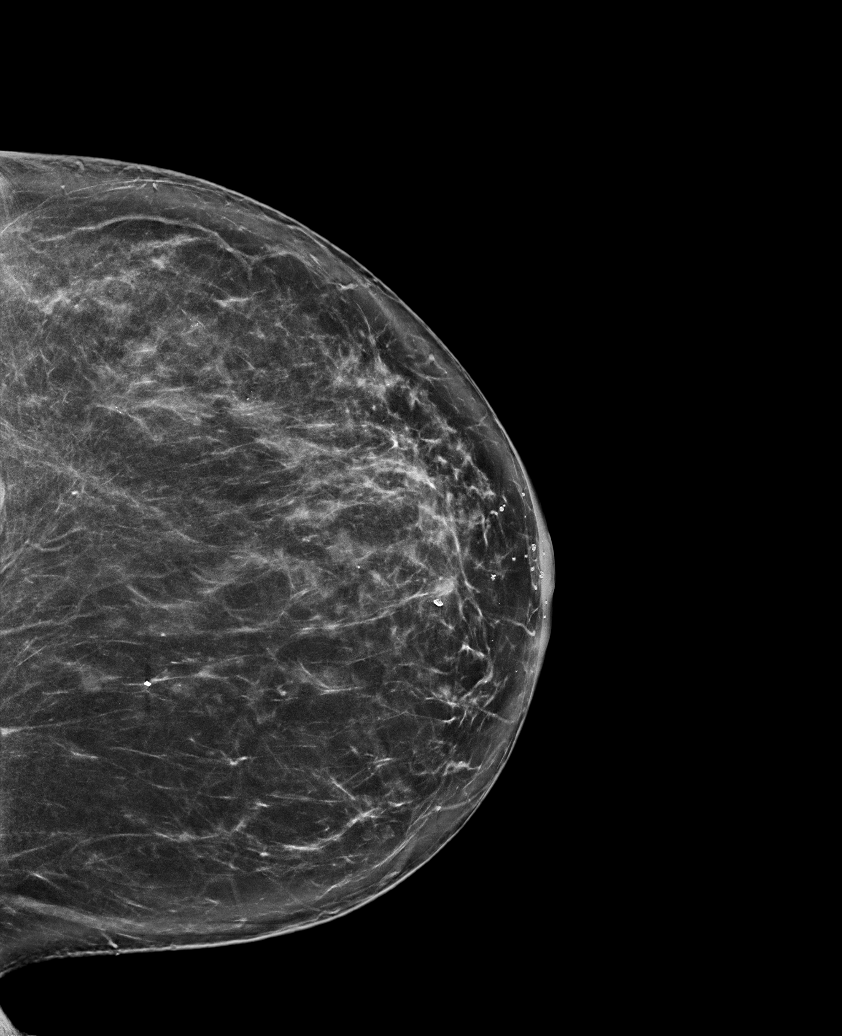

[L MLO synth-2D (2 of 2)]
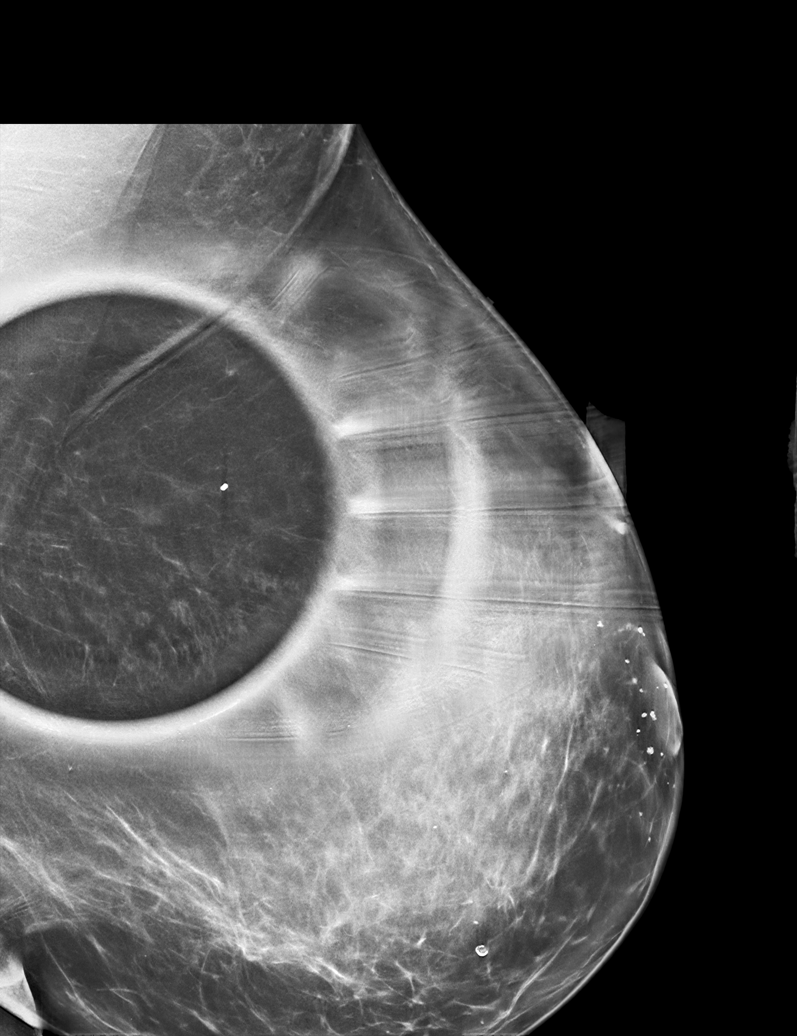

[L CC synth-2D (2 of 2)]
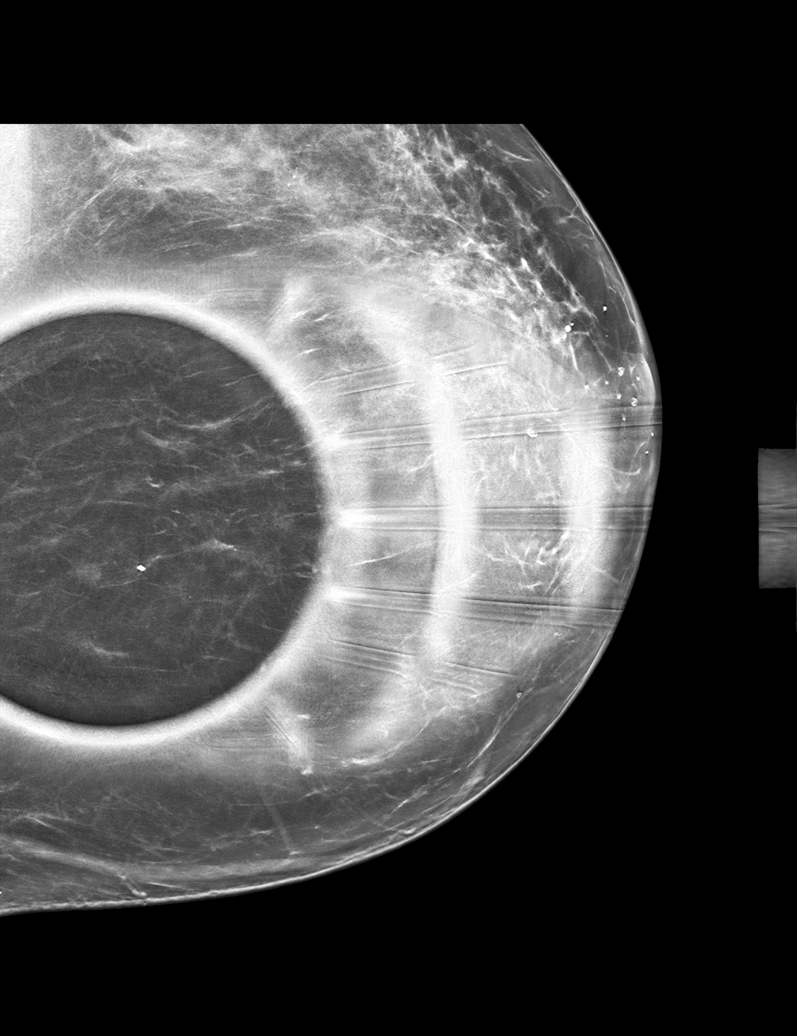

[L ML tomo · 2 of 89 frames shown]
[frame 29/89]
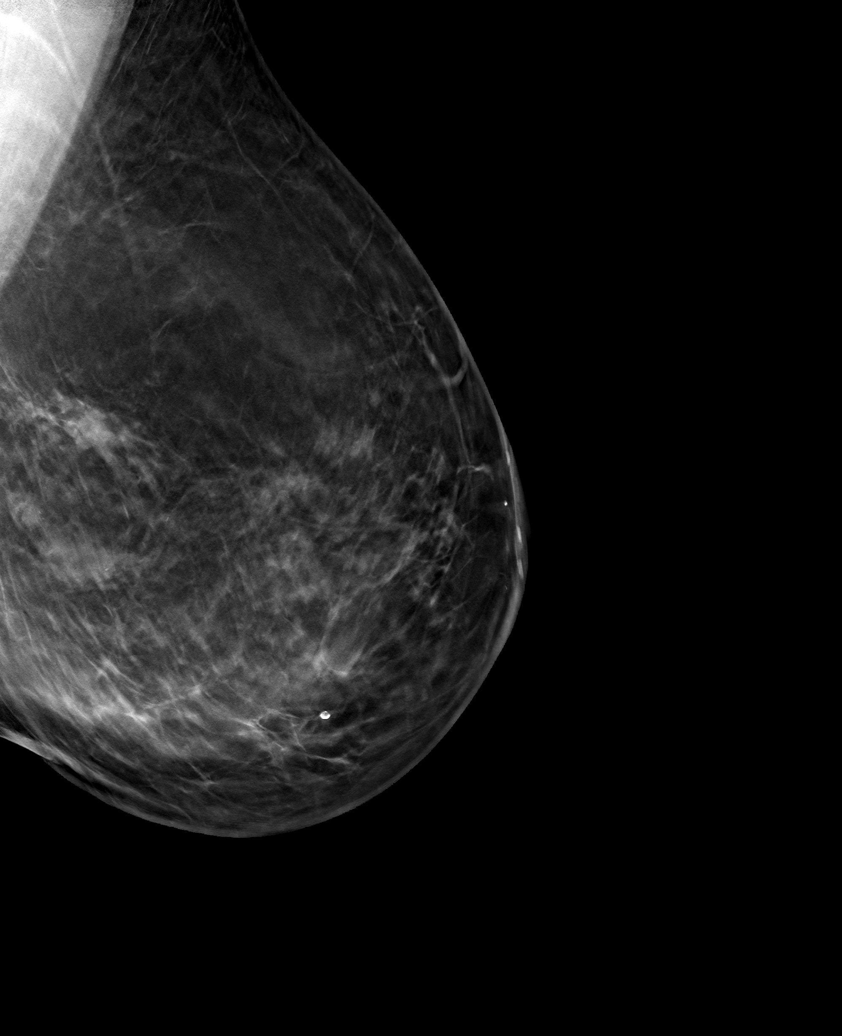
[frame 45/89]
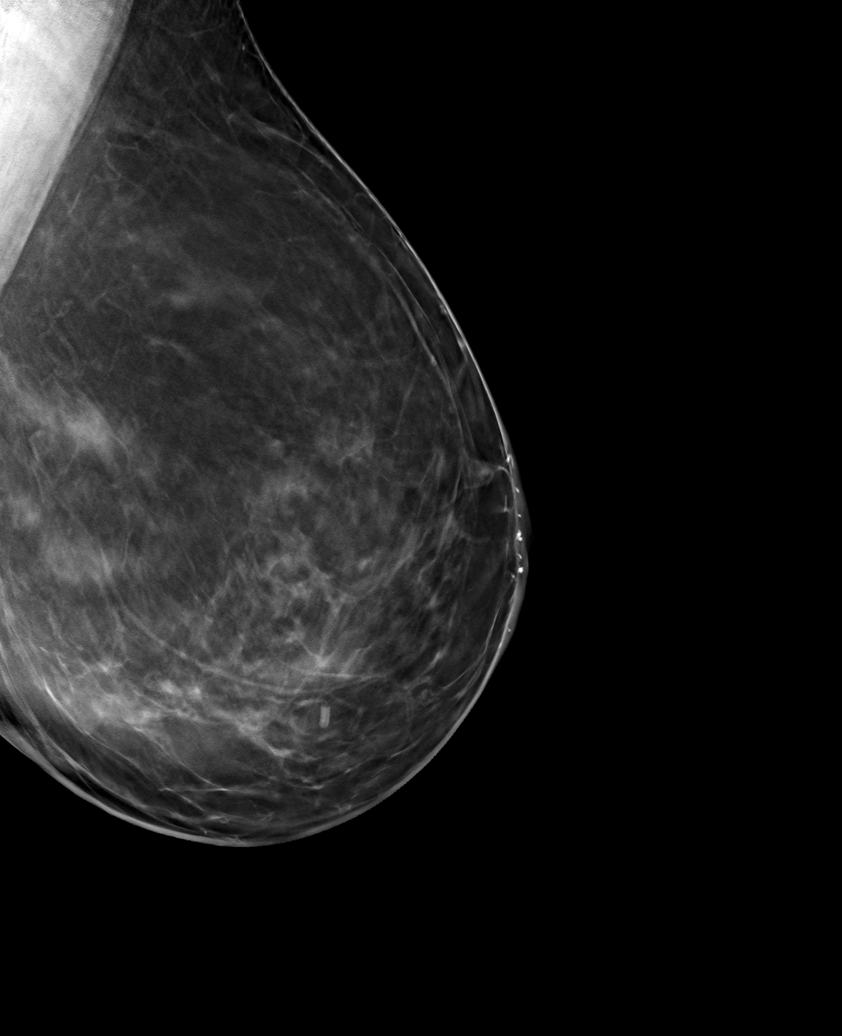

[6 of 9 positions shown; findings below may reference images not displayed]

ACR Breast Density Category b: There are scattered areas of
fibroglandular density.
FINDINGS: Tomosynthesis images through the superior posterior left breast
demonstrates no persistent suspicious findings of distortion. There
is a persistent focal asymmetry in the upper inner far posterior
left breast measuring approximately 5 mm. This is positioned about 2
cm posterior to the spiral shaped biopsy marking clip. This mass was
present on the patient's mammogram from 2510 and is stable.

Mammographic images were processed with CAD.

Ultrasound targeted to the left breast at 11 o'clock, 7 cm from the
nipple demonstrates an oval circumscribed hypoechoic mass measuring
6 x 4 x 5 mm. There is an angular margin to the mass. No blood flow
is seen within it on color Doppler imaging. Ultrasound of the left
axilla demonstrates multiple normal-appearing lymph nodes.
IMPRESSION: 1. The asymmetry in the upper inner quadrant of the left breast seen
on the screening mammogram corresponds with a mass that has been
stable for greater than 2 years. This is a benign finding.

2.  No persistent evidence of distortion in the left breast.

RECOMMENDATION:
Screening mammogram in one year.(Code:V3-W-VU8)

I have discussed the findings and recommendations with the patient.
If applicable, a reminder letter will be sent to the patient
regarding the next appointment.

BI-RADS CATEGORY  2: Benign.

## 2021-02-26 ENCOUNTER — Other Ambulatory Visit: Payer: Self-pay | Admitting: Physician Assistant

## 2021-02-26 DIAGNOSIS — E118 Type 2 diabetes mellitus with unspecified complications: Secondary | ICD-10-CM

## 2021-02-26 DIAGNOSIS — J45909 Unspecified asthma, uncomplicated: Secondary | ICD-10-CM

## 2021-03-23 ENCOUNTER — Other Ambulatory Visit: Payer: Self-pay | Admitting: Physician Assistant

## 2021-03-23 DIAGNOSIS — E118 Type 2 diabetes mellitus with unspecified complications: Secondary | ICD-10-CM

## 2021-03-27 ENCOUNTER — Other Ambulatory Visit: Payer: Self-pay | Admitting: Physician Assistant

## 2021-03-27 DIAGNOSIS — E118 Type 2 diabetes mellitus with unspecified complications: Secondary | ICD-10-CM

## 2021-03-27 DIAGNOSIS — J45909 Unspecified asthma, uncomplicated: Secondary | ICD-10-CM

## 2021-04-10 ENCOUNTER — Other Ambulatory Visit: Payer: Self-pay | Admitting: Physician Assistant

## 2021-04-10 DIAGNOSIS — J45909 Unspecified asthma, uncomplicated: Secondary | ICD-10-CM

## 2021-09-08 ENCOUNTER — Encounter: Payer: Self-pay | Admitting: Physician Assistant
